# Patient Record
Sex: Female | Born: 1950
Health system: Southern US, Community
[De-identification: ages and names within clinical notes are randomized; demographics above are authoritative.]

## PROBLEM LIST (undated history)

## (undated) DIAGNOSIS — M7541 Impingement syndrome of right shoulder: Secondary | ICD-10-CM

## (undated) DIAGNOSIS — N952 Postmenopausal atrophic vaginitis: Secondary | ICD-10-CM

## (undated) DIAGNOSIS — K6389 Other specified diseases of intestine: Secondary | ICD-10-CM

## (undated) DIAGNOSIS — F32A Depression, unspecified: Secondary | ICD-10-CM

## (undated) DIAGNOSIS — Z8619 Personal history of other infectious and parasitic diseases: Secondary | ICD-10-CM

## (undated) DIAGNOSIS — M1990 Unspecified osteoarthritis, unspecified site: Secondary | ICD-10-CM

## (undated) DIAGNOSIS — C4490 Unspecified malignant neoplasm of skin, unspecified: Secondary | ICD-10-CM

## (undated) DIAGNOSIS — F329 Major depressive disorder, single episode, unspecified: Secondary | ICD-10-CM

## (undated) DIAGNOSIS — E782 Mixed hyperlipidemia: Secondary | ICD-10-CM

## (undated) HISTORY — DX: Unspecified malignant neoplasm of skin, unspecified: C44.90

## (undated) HISTORY — DX: Depression, unspecified: F32.A

## (undated) HISTORY — PX: CARPAL TUNNEL RELEASE: SHX101

## (undated) HISTORY — DX: Personal history of other infectious and parasitic diseases: Z86.19

## (undated) HISTORY — DX: Postmenopausal atrophic vaginitis: N95.2

## (undated) HISTORY — DX: Major depressive disorder, single episode, unspecified: F32.9

## (undated) HISTORY — PX: TONSILLECTOMY: SUR1361

## (undated) HISTORY — DX: Other specified diseases of intestine: K63.89

---
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
## 1990-08-25 HISTORY — PX: ABDOMINAL HYSTERECTOMY: SHX81

## 2000-12-03 ENCOUNTER — Other Ambulatory Visit: Admission: RE | Admit: 2000-12-03 | Discharge: 2000-12-03 | Payer: Self-pay | Admitting: Internal Medicine

## 2001-11-10 ENCOUNTER — Encounter: Payer: Self-pay | Admitting: Internal Medicine

## 2001-11-10 ENCOUNTER — Ambulatory Visit (HOSPITAL_COMMUNITY): Admission: RE | Admit: 2001-11-10 | Discharge: 2001-11-10 | Payer: Self-pay | Admitting: Internal Medicine

## 2002-11-01 ENCOUNTER — Encounter: Payer: Self-pay | Admitting: Gastroenterology

## 2002-11-02 ENCOUNTER — Encounter: Payer: Self-pay | Admitting: Gastroenterology

## 2002-11-15 ENCOUNTER — Encounter: Payer: Self-pay | Admitting: Gastroenterology

## 2003-09-13 ENCOUNTER — Ambulatory Visit (HOSPITAL_COMMUNITY): Admission: RE | Admit: 2003-09-13 | Discharge: 2003-09-13 | Payer: Self-pay | Admitting: Internal Medicine

## 2004-09-19 ENCOUNTER — Ambulatory Visit (HOSPITAL_COMMUNITY): Admission: RE | Admit: 2004-09-19 | Discharge: 2004-09-19 | Payer: Self-pay | Admitting: Internal Medicine

## 2004-10-28 ENCOUNTER — Ambulatory Visit: Payer: Self-pay | Admitting: Internal Medicine

## 2005-05-22 ENCOUNTER — Ambulatory Visit: Payer: Self-pay | Admitting: Internal Medicine

## 2005-06-04 ENCOUNTER — Ambulatory Visit: Payer: Self-pay | Admitting: Internal Medicine

## 2005-06-11 ENCOUNTER — Ambulatory Visit: Payer: Self-pay | Admitting: Internal Medicine

## 2005-07-22 ENCOUNTER — Ambulatory Visit: Payer: Self-pay | Admitting: Internal Medicine

## 2005-10-17 ENCOUNTER — Ambulatory Visit: Payer: Self-pay | Admitting: Internal Medicine

## 2005-10-20 ENCOUNTER — Ambulatory Visit (HOSPITAL_COMMUNITY): Admission: RE | Admit: 2005-10-20 | Discharge: 2005-10-20 | Payer: Self-pay | Admitting: Internal Medicine

## 2006-06-22 ENCOUNTER — Ambulatory Visit: Payer: Self-pay | Admitting: Internal Medicine

## 2006-06-30 ENCOUNTER — Ambulatory Visit: Payer: Self-pay | Admitting: Internal Medicine

## 2006-06-30 LAB — CONVERTED CEMR LAB
Cholesterol: 222 mg/dL (ref 0–200)
HDL: 47.4 mg/dL (ref >39.0)
Triglyceride fasting, serum: 73 mg/dL (ref 0–149)
VLDL: 15 mg/dL (ref 0–40)

## 2006-12-03 ENCOUNTER — Ambulatory Visit (HOSPITAL_COMMUNITY): Admission: RE | Admit: 2006-12-03 | Discharge: 2006-12-03 | Payer: Self-pay | Admitting: Internal Medicine

## 2006-12-22 ENCOUNTER — Ambulatory Visit: Payer: Self-pay | Admitting: Internal Medicine

## 2006-12-31 ENCOUNTER — Ambulatory Visit: Payer: Self-pay | Admitting: Internal Medicine

## 2006-12-31 ENCOUNTER — Encounter: Payer: Self-pay | Admitting: Internal Medicine

## 2007-04-09 DIAGNOSIS — F3289 Other specified depressive episodes: Secondary | ICD-10-CM | POA: Insufficient documentation

## 2007-04-09 DIAGNOSIS — F329 Major depressive disorder, single episode, unspecified: Secondary | ICD-10-CM

## 2007-05-07 ENCOUNTER — Ambulatory Visit: Payer: Self-pay | Admitting: Internal Medicine

## 2007-06-29 ENCOUNTER — Ambulatory Visit: Payer: Self-pay | Admitting: Internal Medicine

## 2007-06-30 LAB — CONVERTED CEMR LAB
AST: 23 units/L (ref 0–37)
Bilirubin, Direct: 0.1 mg/dL (ref 0.0–0.3)
Chloride: 105 meq/L (ref 96–112)
Creatinine, Ser: 0.8 mg/dL (ref 0.4–1.2)
Direct LDL: 170.9 mg/dL
Eosinophils Relative: 1.2 % (ref 0.0–5.0)
Glucose, Bld: 70 mg/dL (ref 70–99)
HCT: 37.7 % (ref 36.0–46.0)
Hemoglobin: 12.8 g/dL (ref 12.0–15.0)
MCV: 84.4 fL (ref 78.0–100.0)
Neutrophils Relative %: 51.2 % (ref 43.0–77.0)
RBC: 4.47 M/uL (ref 3.87–5.11)
RDW: 12.8 % (ref 11.5–14.6)
Sodium: 141 meq/L (ref 135–145)
Total Bilirubin: 0.8 mg/dL (ref 0.3–1.2)
Total CHOL/HDL Ratio: 4.9
Total Protein: 6.4 g/dL (ref 6.0–8.3)
Triglycerides: 97 mg/dL (ref 0–149)
WBC: 5.3 10*3/uL (ref 4.5–10.5)

## 2007-07-06 ENCOUNTER — Ambulatory Visit: Payer: Self-pay | Admitting: Internal Medicine

## 2007-07-06 DIAGNOSIS — N952 Postmenopausal atrophic vaginitis: Secondary | ICD-10-CM | POA: Insufficient documentation

## 2007-07-06 DIAGNOSIS — E782 Mixed hyperlipidemia: Secondary | ICD-10-CM | POA: Insufficient documentation

## 2008-01-12 ENCOUNTER — Ambulatory Visit (HOSPITAL_COMMUNITY): Admission: RE | Admit: 2008-01-12 | Discharge: 2008-01-12 | Payer: Self-pay | Admitting: Internal Medicine

## 2008-02-14 ENCOUNTER — Ambulatory Visit: Payer: Self-pay | Admitting: Internal Medicine

## 2008-02-14 LAB — CONVERTED CEMR LAB
Direct LDL: 154.1 mg/dL
Vit D, 1,25-Dihydroxy: 30 (ref 30–89)

## 2008-02-18 ENCOUNTER — Ambulatory Visit: Payer: Self-pay | Admitting: Internal Medicine

## 2008-02-18 LAB — CONVERTED CEMR LAB
HDL goal, serum: 40 mg/dL
LDL Goal: 130 mg/dL

## 2008-06-07 ENCOUNTER — Ambulatory Visit: Payer: Self-pay | Admitting: Internal Medicine

## 2008-06-13 ENCOUNTER — Ambulatory Visit: Payer: Self-pay | Admitting: Internal Medicine

## 2008-06-16 ENCOUNTER — Encounter: Payer: Self-pay | Admitting: Internal Medicine

## 2008-06-20 ENCOUNTER — Telehealth: Payer: Self-pay | Admitting: *Deleted

## 2008-06-21 ENCOUNTER — Telehealth: Payer: Self-pay | Admitting: Gastroenterology

## 2008-07-31 ENCOUNTER — Telehealth: Payer: Self-pay | Admitting: *Deleted

## 2008-11-05 IMAGING — MG MM DIGITAL SCREENING BILAT W/ CAD
4 series · 4 of 4 positions shown · non-contrast
Comparison: Prior studies.

DG SCREEN MAMMOGRAM BILATERAL
Bilateral CC and MLO view(s) were taken.
Technologist: Neetish Hiriart, RT, RM

DIGITAL SCREENING MAMMOGRAM WITH CAD:

[R CC]
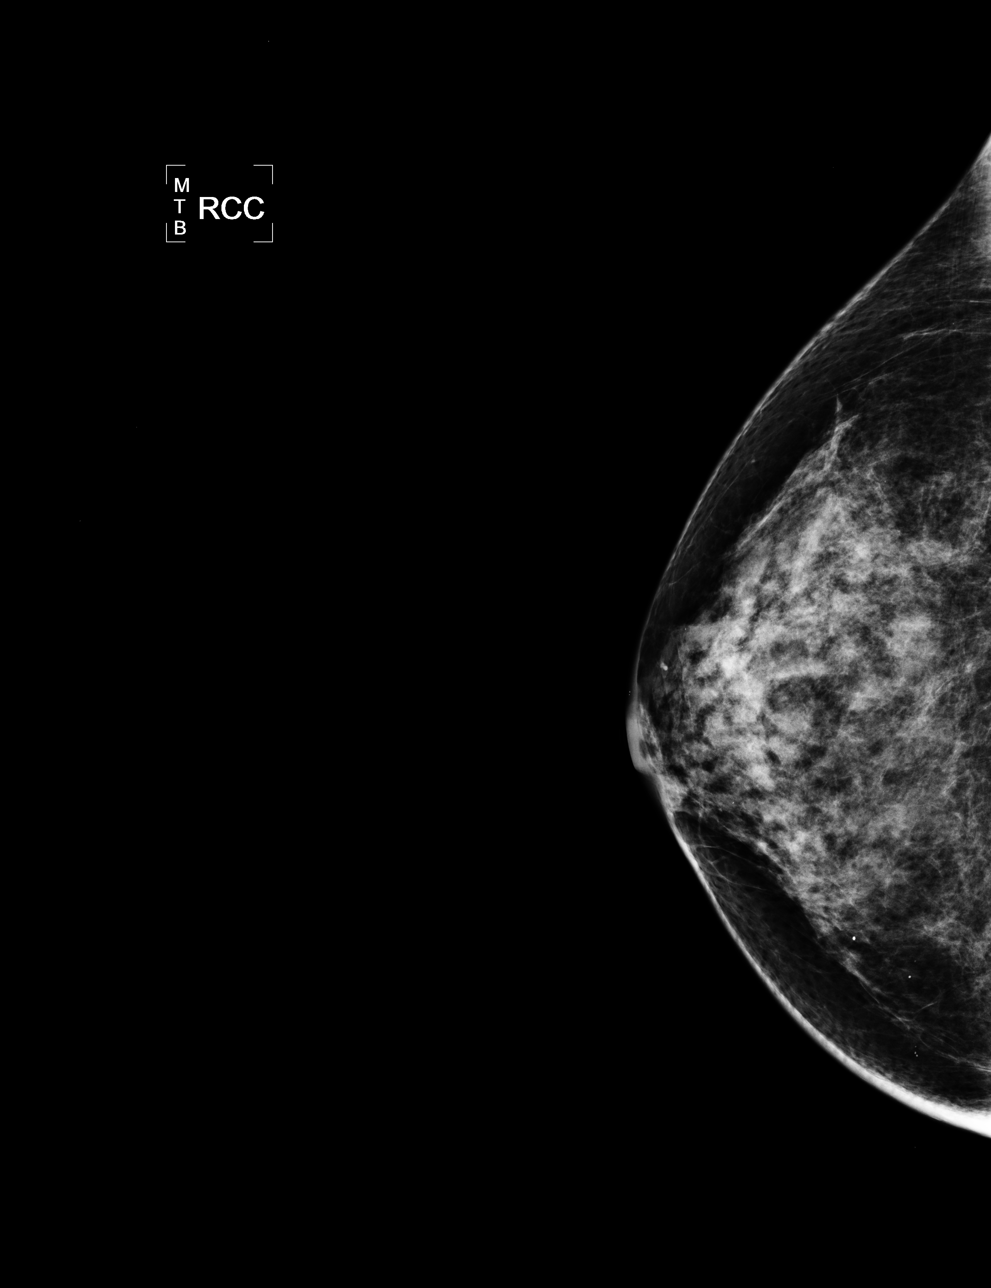

[R MLO]
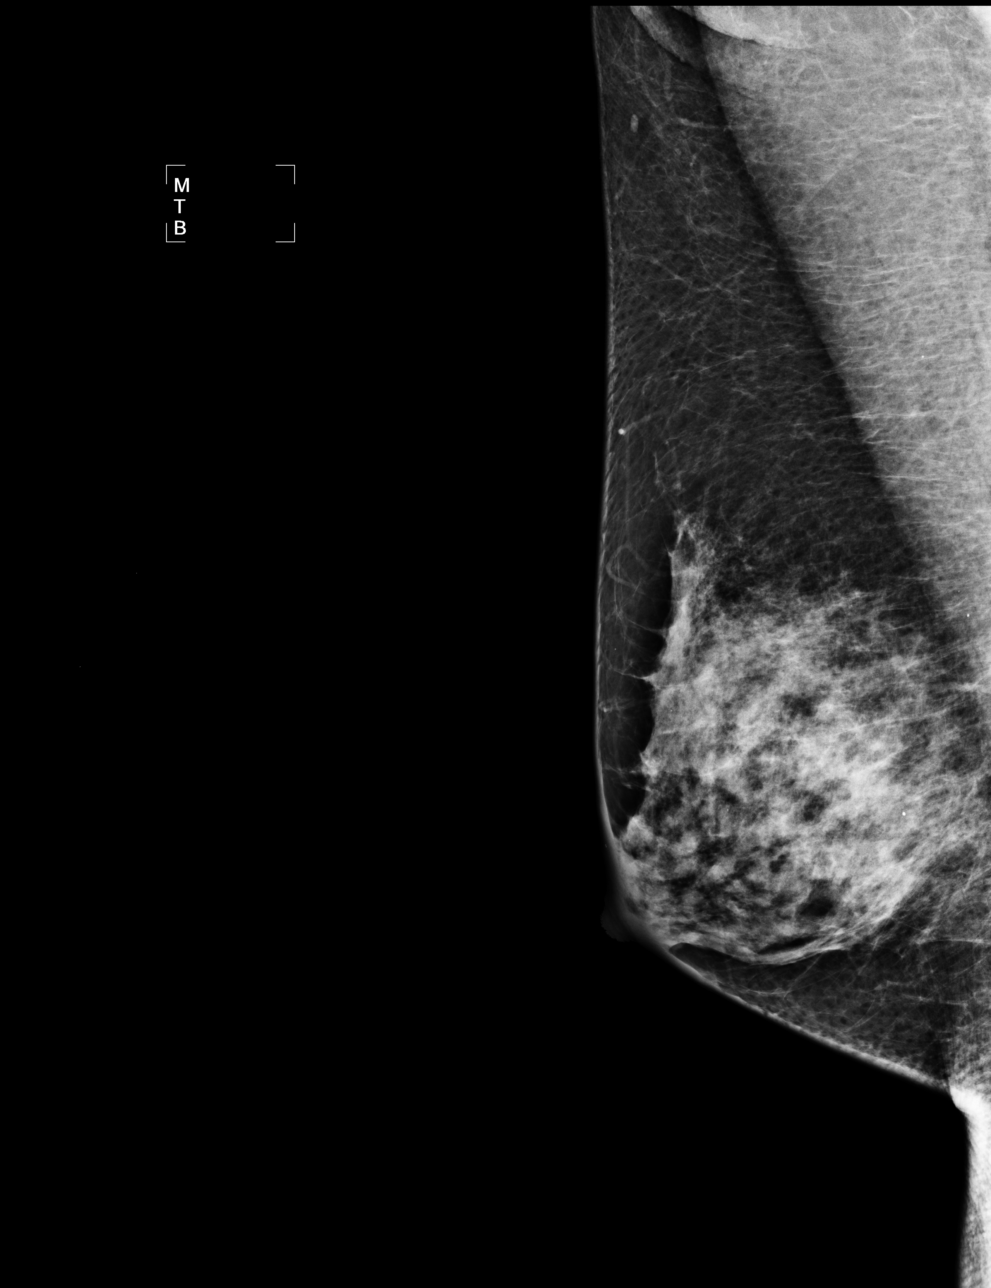

[L CC]
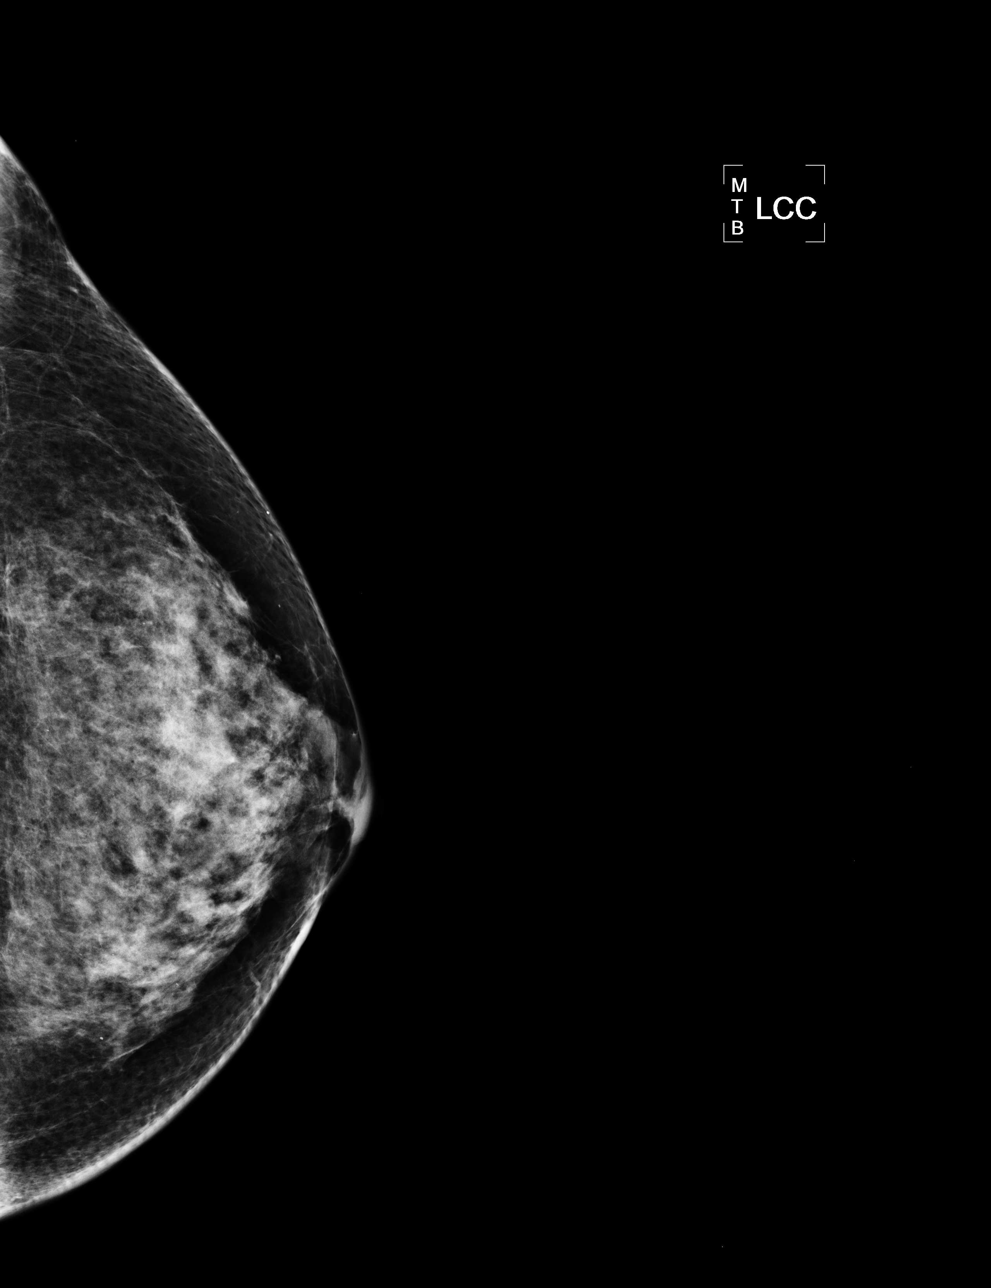

[L MLO]
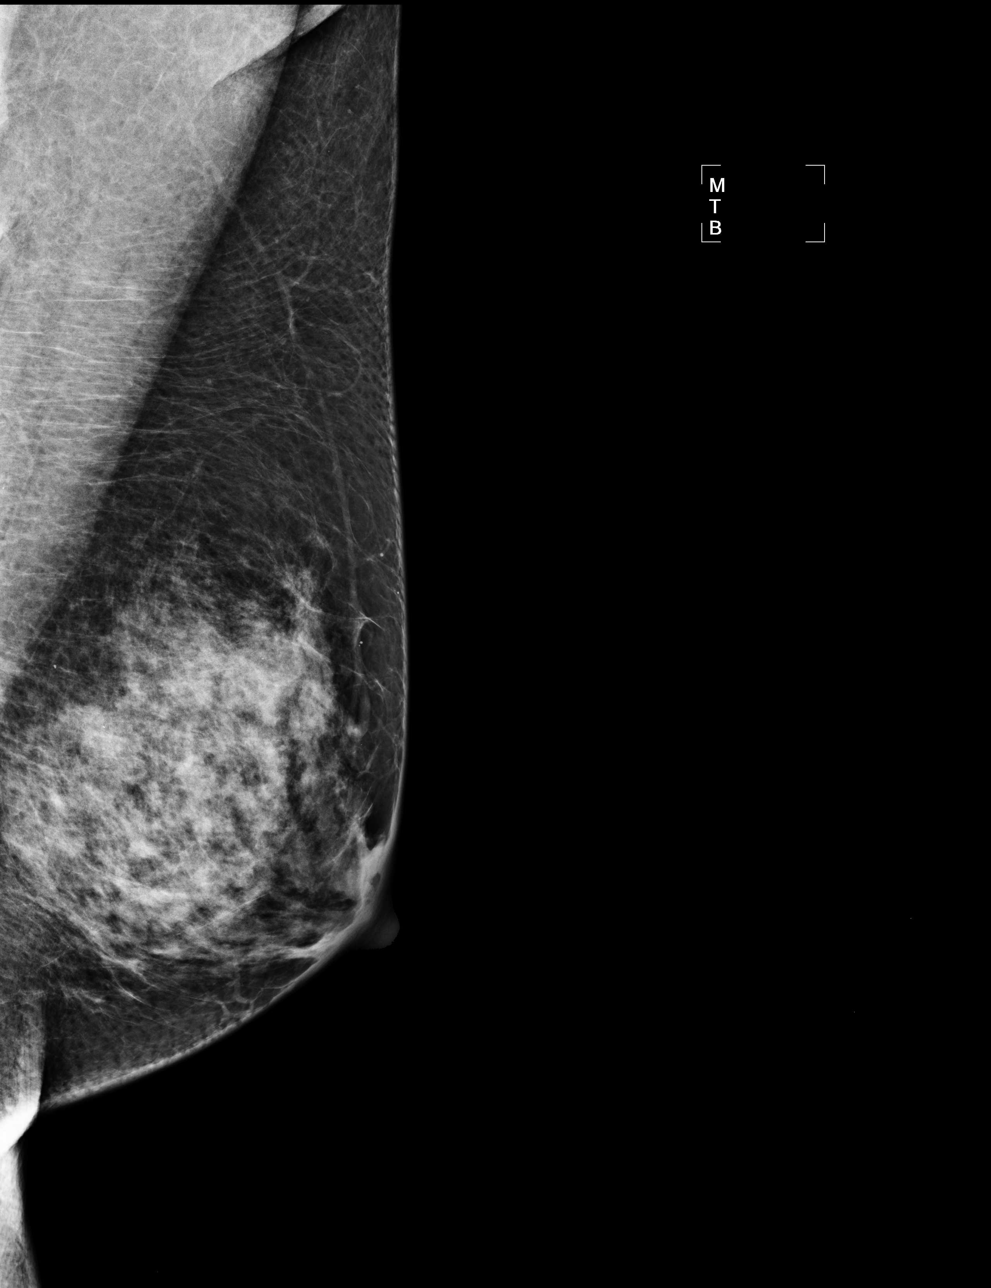

[4 of 4 positions shown; findings below may reference images not displayed]

The breast tissue is heterogeneously dense.  There is no dominant mass, architectural distortion or
calcification to suggest malignancy.
IMPRESSION: No mammographic evidence of malignancy.  Suggest yearly screening mammography.

ASSESSMENT: Negative - BI-RADS 1

Screening mammogram in 1 year.
ANALYZED BY COMPUTER AIDED DETECTION. , THIS PROCEDURE WAS A DIGITAL MAMMOGRAM.

## 2008-11-06 ENCOUNTER — Ambulatory Visit: Payer: Self-pay | Admitting: Internal Medicine

## 2008-11-06 LAB — CONVERTED CEMR LAB
Blood in Urine, dipstick: NEGATIVE
Nitrite: NEGATIVE
Specific Gravity, Urine: 1.01
Tissue Transglutaminase Ab, IgA: 0 units (ref <7)
WBC Urine, dipstick: NEGATIVE

## 2008-11-08 ENCOUNTER — Encounter: Payer: Self-pay | Admitting: Internal Medicine

## 2008-11-08 LAB — CONVERTED CEMR LAB
BUN: 14 mg/dL (ref 6–23)
Basophils Absolute: 0 10*3/uL (ref 0.0–0.1)
Bilirubin, Direct: 0 mg/dL (ref 0.0–0.3)
Chloride: 103 meq/L (ref 96–112)
Creatinine, Ser: 0.8 mg/dL (ref 0.4–1.2)
Eosinophils Absolute: 0.1 10*3/uL (ref 0.0–0.7)
Eosinophils Relative: 1 % (ref 0.0–5.0)
Free T4: 0.8 ng/dL (ref 0.6–1.6)
IgA: 87 mg/dL (ref 68–378)
MCV: 85.1 fL (ref 78.0–100.0)
Monocytes Absolute: 0.3 10*3/uL (ref 0.1–1.0)
Neutrophils Relative %: 57.7 % (ref 43.0–77.0)
Platelets: 216 10*3/uL (ref 150.0–400.0)
RDW: 12.6 % (ref 11.5–14.6)
T3, Free: 2.5 pg/mL (ref 2.3–4.2)
TSH: 1.28 microintl units/mL (ref 0.35–5.50)
Total Bilirubin: 0.7 mg/dL (ref 0.3–1.2)
WBC: 6.1 10*3/uL (ref 4.5–10.5)

## 2008-12-05 ENCOUNTER — Ambulatory Visit: Payer: Self-pay | Admitting: Gastroenterology

## 2008-12-06 ENCOUNTER — Ambulatory Visit: Payer: Self-pay | Admitting: Gastroenterology

## 2008-12-06 ENCOUNTER — Encounter: Payer: Self-pay | Admitting: Gastroenterology

## 2008-12-11 ENCOUNTER — Encounter: Payer: Self-pay | Admitting: Gastroenterology

## 2008-12-14 ENCOUNTER — Telehealth: Payer: Self-pay | Admitting: Gastroenterology

## 2009-01-01 ENCOUNTER — Telehealth: Payer: Self-pay | Admitting: *Deleted

## 2009-01-31 ENCOUNTER — Telehealth: Payer: Self-pay | Admitting: *Deleted

## 2009-02-07 ENCOUNTER — Ambulatory Visit (HOSPITAL_COMMUNITY): Admission: RE | Admit: 2009-02-07 | Discharge: 2009-02-07 | Payer: Self-pay | Admitting: Internal Medicine

## 2009-03-12 ENCOUNTER — Ambulatory Visit: Payer: Self-pay | Admitting: Internal Medicine

## 2009-03-12 LAB — CONVERTED CEMR LAB
AST: 22 units/L (ref 0–37)
Albumin: 4.2 g/dL (ref 3.5–5.2)
Alkaline Phosphatase: 52 units/L (ref 39–117)
Basophils Absolute: 0 10*3/uL (ref 0.0–0.1)
Basophils Relative: 0.7 % (ref 0.0–3.0)
CO2: 31 meq/L (ref 19–32)
GFR calc non Af Amer: 90.77 mL/min (ref >60)
Glucose, Bld: 81 mg/dL (ref 70–99)
HCT: 39.1 % (ref 36.0–46.0)
Hemoglobin: 13.3 g/dL (ref 12.0–15.0)
Lymphs Abs: 2.1 10*3/uL (ref 0.7–4.0)
MCHC: 33.9 g/dL (ref 30.0–36.0)
Monocytes Relative: 8.5 % (ref 3.0–12.0)
Neutro Abs: 2 10*3/uL (ref 1.4–7.7)
Nitrite: NEGATIVE
Potassium: 4.1 meq/L (ref 3.5–5.1)
RBC: 4.59 M/uL (ref 3.87–5.11)
RDW: 12.9 % (ref 11.5–14.6)
Sodium: 143 meq/L (ref 135–145)
Specific Gravity, Urine: 1.015
TSH: 2.44 microintl units/mL (ref 0.35–5.50)
Total CHOL/HDL Ratio: 5
Total Protein: 6.5 g/dL (ref 6.0–8.3)
Urobilinogen, UA: 0.2
WBC Urine, dipstick: NEGATIVE

## 2009-03-19 ENCOUNTER — Ambulatory Visit: Payer: Self-pay | Admitting: Internal Medicine

## 2009-03-22 ENCOUNTER — Telehealth: Payer: Self-pay | Admitting: *Deleted

## 2009-05-10 ENCOUNTER — Ambulatory Visit: Payer: Self-pay | Admitting: Internal Medicine

## 2009-05-10 LAB — CONVERTED CEMR LAB
AST: 22 units/L (ref 0–37)
Alkaline Phosphatase: 45 units/L (ref 39–117)
Bilirubin, Direct: 0.1 mg/dL (ref 0.0–0.3)
LDL Cholesterol: 93 mg/dL (ref 0–99)
Total Bilirubin: 0.8 mg/dL (ref 0.3–1.2)
Total CHOL/HDL Ratio: 3
Triglycerides: 80 mg/dL (ref 0.0–149.0)

## 2009-05-16 ENCOUNTER — Ambulatory Visit: Payer: Self-pay | Admitting: Internal Medicine

## 2009-06-04 ENCOUNTER — Telehealth: Payer: Self-pay | Admitting: *Deleted

## 2009-11-08 ENCOUNTER — Telehealth: Payer: Self-pay | Admitting: *Deleted

## 2009-12-10 ENCOUNTER — Ambulatory Visit: Payer: Self-pay | Admitting: Internal Medicine

## 2009-12-10 DIAGNOSIS — K589 Irritable bowel syndrome without diarrhea: Secondary | ICD-10-CM | POA: Insufficient documentation

## 2009-12-10 DIAGNOSIS — T50995A Adverse effect of other drugs, medicaments and biological substances, initial encounter: Secondary | ICD-10-CM | POA: Insufficient documentation

## 2010-01-07 ENCOUNTER — Telehealth: Payer: Self-pay | Admitting: *Deleted

## 2010-01-09 ENCOUNTER — Ambulatory Visit: Payer: Self-pay | Admitting: Internal Medicine

## 2010-02-13 ENCOUNTER — Ambulatory Visit (HOSPITAL_COMMUNITY): Admission: RE | Admit: 2010-02-13 | Discharge: 2010-02-13 | Payer: Self-pay | Admitting: Internal Medicine

## 2010-03-13 ENCOUNTER — Ambulatory Visit: Payer: Self-pay | Admitting: Internal Medicine

## 2010-03-13 DIAGNOSIS — M25569 Pain in unspecified knee: Secondary | ICD-10-CM | POA: Insufficient documentation

## 2010-03-13 LAB — CONVERTED CEMR LAB
ALT: 16 units/L (ref 0–35)
AST: 21 units/L (ref 0–37)
Alkaline Phosphatase: 48 units/L (ref 39–117)
Basophils Absolute: 0 10*3/uL (ref 0.0–0.1)
Blood in Urine, dipstick: NEGATIVE
Calcium: 8.6 mg/dL (ref 8.4–10.5)
Eosinophils Relative: 1.6 % (ref 0.0–5.0)
GFR calc non Af Amer: 74.32 mL/min (ref >60)
Glucose, Bld: 87 mg/dL (ref 70–99)
HCT: 35.3 % — ABNORMAL LOW (ref 36.0–46.0)
HDL: 55.5 mg/dL (ref >39.00)
Hemoglobin: 12.1 g/dL (ref 12.0–15.0)
Ketones, urine, test strip: NEGATIVE
Lymphocytes Relative: 36.1 % (ref 12.0–46.0)
Monocytes Relative: 8.5 % (ref 3.0–12.0)
Neutro Abs: 2.3 10*3/uL (ref 1.4–7.7)
Nitrite: NEGATIVE
Platelets: 227 10*3/uL (ref 150.0–400.0)
Potassium: 4.7 meq/L (ref 3.5–5.1)
RDW: 13.9 % (ref 11.5–14.6)
Sed Rate: 5 mm/hr (ref 0–22)
Sodium: 142 meq/L (ref 135–145)
Specific Gravity, Urine: 1.02
Total Bilirubin: 0.4 mg/dL (ref 0.3–1.2)
Triglycerides: 112 mg/dL (ref 0.0–149.0)
Urobilinogen, UA: 0.2
VLDL: 22.4 mg/dL (ref 0.0–40.0)
WBC Urine, dipstick: NEGATIVE
WBC: 4.4 10*3/uL — ABNORMAL LOW (ref 4.5–10.5)

## 2010-03-20 ENCOUNTER — Ambulatory Visit: Payer: Self-pay | Admitting: Internal Medicine

## 2010-03-20 DIAGNOSIS — D72819 Decreased white blood cell count, unspecified: Secondary | ICD-10-CM | POA: Insufficient documentation

## 2010-04-11 ENCOUNTER — Ambulatory Visit: Payer: Self-pay | Admitting: Internal Medicine

## 2010-04-22 ENCOUNTER — Ambulatory Visit: Payer: Self-pay | Admitting: Internal Medicine

## 2010-05-08 ENCOUNTER — Telehealth: Payer: Self-pay | Admitting: *Deleted

## 2010-05-20 ENCOUNTER — Ambulatory Visit: Payer: Self-pay | Admitting: Internal Medicine

## 2010-05-29 ENCOUNTER — Ambulatory Visit: Payer: Self-pay | Admitting: Psychology

## 2010-06-11 ENCOUNTER — Ambulatory Visit: Payer: Self-pay | Admitting: Psychology

## 2010-06-12 ENCOUNTER — Telehealth: Payer: Self-pay | Admitting: Internal Medicine

## 2010-06-14 ENCOUNTER — Encounter: Payer: Self-pay | Admitting: Internal Medicine

## 2010-06-17 ENCOUNTER — Ambulatory Visit: Payer: Self-pay | Admitting: Internal Medicine

## 2010-06-17 LAB — CONVERTED CEMR LAB: CRP, High Sensitivity: 0.33 (ref 0.00–5.00)

## 2010-06-19 ENCOUNTER — Telehealth (INDEPENDENT_AMBULATORY_CARE_PROVIDER_SITE_OTHER): Payer: Self-pay | Admitting: *Deleted

## 2010-06-21 ENCOUNTER — Encounter: Payer: Self-pay | Admitting: *Deleted

## 2010-06-21 LAB — CONVERTED CEMR LAB
Anti Nuclear Antibody(ANA): NEGATIVE
C3 Complement: 78 mg/dL — ABNORMAL LOW (ref 88–201)
Complement C4, Body Fluid: 20 mg/dL (ref 16–47)
Hemoglobin: 11.9 g/dL — ABNORMAL LOW (ref 12.0–15.0)
Lymphocytes Relative: 32 % (ref 12–46)
Lymphs Abs: 2.4 10*3/uL (ref 0.7–4.0)
Monocytes Relative: 6 % (ref 3–12)
Neutro Abs: 4.6 10*3/uL (ref 1.7–7.7)
Neutrophils Relative %: 61 % (ref 43–77)
RBC: 4.33 M/uL (ref 3.87–5.11)
Rhuematoid fact SerPl-aCnc: <20 intl units/mL (ref 0–20)
WBC: 7.6 10*3/uL (ref 4.0–10.5)
ds DNA Ab: <1 (ref <30)

## 2010-07-10 ENCOUNTER — Ambulatory Visit: Payer: Self-pay | Admitting: Internal Medicine

## 2010-09-13 ENCOUNTER — Other Ambulatory Visit: Payer: Self-pay | Admitting: Internal Medicine

## 2010-09-13 ENCOUNTER — Ambulatory Visit
Admission: RE | Admit: 2010-09-13 | Discharge: 2010-09-13 | Payer: Self-pay | Source: Home / Self Care | Attending: Internal Medicine | Admitting: Internal Medicine

## 2010-09-13 LAB — CBC WITH DIFFERENTIAL/PLATELET
Basophils Absolute: 0 10*3/uL (ref 0.0–0.1)
Basophils Relative: 0.6 % (ref 0.0–3.0)
Eosinophils Absolute: 0.1 10*3/uL (ref 0.0–0.7)
Eosinophils Relative: 1.7 % (ref 0.0–5.0)
HCT: 39 % (ref 36.0–46.0)
Hemoglobin: 13.4 g/dL (ref 12.0–15.0)
Lymphocytes Relative: 39.2 % (ref 12.0–46.0)
Lymphs Abs: 1.8 10*3/uL (ref 0.7–4.0)
MCHC: 34.3 g/dL (ref 30.0–36.0)
MCV: 84.2 fl (ref 78.0–100.0)
Monocytes Absolute: 0.4 10*3/uL (ref 0.1–1.0)
Monocytes Relative: 8.9 % (ref 3.0–12.0)
Neutro Abs: 2.3 10*3/uL (ref 1.4–7.7)
Neutrophils Relative %: 49.6 % (ref 43.0–77.0)
Platelets: 249 10*3/uL (ref 150.0–400.0)
RBC: 4.63 Mil/uL (ref 3.87–5.11)
RDW: 14.1 % (ref 11.5–14.6)
WBC: 4.6 10*3/uL (ref 4.5–10.5)

## 2010-09-20 ENCOUNTER — Ambulatory Visit
Admission: RE | Admit: 2010-09-20 | Discharge: 2010-09-20 | Payer: Self-pay | Source: Home / Self Care | Attending: Internal Medicine | Admitting: Internal Medicine

## 2010-09-24 NOTE — Progress Notes (Signed)
  Faxed results to Dr. Durene Romans. Fax # [phone].

## 2010-09-24 NOTE — Assessment & Plan Note (Signed)
Summary: congestion//ccm   Vital Signs:  Patient profile:   60 year old female Menstrual status:  hysterectomy Weight:      154 pounds O2 Sat:      97 % on Room air Temp:     99.1 degrees F oral Pulse rate:   78 / minute BP sitting:   110 / 60  (left arm) Cuff size:   regular  Vitals Entered By: Romualdo Bolk, CMA (AAMA) (July 10, 2010 9:24 AM)  O2 Flow:  Room air CC: Fever 100.5, coughing, congestion, ha, sore throat, runny nose. This started 11/15.   History of Present Illness: Pamela [name] comesin today for acute illness  sda . worked in yard lat week and though t ahd allergies  Now has fever  yesterday.   100.5  and 99 today .    took  advil type meds.  no new pain but has HA and ichy ears and sorethroat and   cough.   had right molar pain yesterday but better today .   No NVD . No cp sob. so sig cough some HA    Preventive Screening-Counseling & Management  Alcohol-Tobacco     Alcohol drinks/day: <1     Alcohol type: wine     >5/day in last 3 mos: socially     Smoking Status: never     Passive Smoke Exposure: no  Caffeine-Diet-Exercise     Caffeine use/day: 3     Does Patient Exercise: yes     Type of exercise: cardio, strength and yoga     Exercise (avg: min/session): 30-60     Times/week: 5  Current Medications (verified): 1)  Cymbalta 60 Mg  Cpep (Duloxetine Hcl) .Marland Kitchen.. 1 By Mouth Once Daily 2)  Premarin 0.625 Mg/gm Crea (Estrogens, Conjugated) .... Use Twice A Week As Directed 3)  Vitamin D3 1000 Unit Tabs (Cholecalciferol) .... Take 1 Tablet By Mouth Once A Day 4)  B Complex  Tabs (B Complex Vitamins) 5)  Meloxicam 7.5 Mg Tabs (Meloxicam) .Marland Kitchen.. 1 By Mouth Two Times A Day  Allergies (verified): 1)  ! Morphine 2)  Simvastatin  Past History:  Past medical, surgical, family and social histories (including risk factors) reviewed, and no changes noted (except as noted below).  Past Medical History: Reviewed history from 03/13/2010 and no changes  required. High Cholesterol Depression ?Hypertension hx of c difficile recurrent vaginitis atrophic  G0P0 Dexa  2005  and 2008 normal IBS Bursistis hip  Past Surgical History: Reviewed history from 02/18/2008 and no changes required. NCST Hysterectomy total benign ovary tumors  Tonsillectomy Carpal tunnel release right  Past History:  Care Management: Gastroenterology: [facility] Jarold Motto Orthopedics: Charlann Boxer Dermatology: Danella Deis  Family History: Reviewed history from 03/19/2009 and no changes required. Family History of Arthritis Family History High cholesterol Family History Hypertension Family History of Sudden Death Family History of Cardiovascular disorder Family History Diabetes 1st degree relative No FH of Colon Cancer: CVA mom recently  in her 7ss  Father died Multiple Myeloma   Social History: Reviewed history from 05/20/2010 and no changes required. Retired  Engineer, civil (consulting)  works as Engineer, technical sales as needed [employer]  not going to work this school year Married Never Smoked Alcohol use-yes Drug use-no Regular exercise-yes but less with knee pain Daily Caffeine Use 3 cups daily father died last year  HH of 2   Review of Systems       The patient complains of anorexia, fever, and headaches.  The patient denies chest pain, syncope, prolonged cough, abdominal pain, melena, abnormal bleeding, and enlarged lymph nodes.         see hpi  Physical Exam  General:  mildly ill in nad  Head:  Normocephalic and atraumatic without obvious abnormalities. No apparent alopecia or balding. Eyes:  clear  Ears:  R ear normal and L ear normal.   Nose:  crusted mucous bilaterally  face nontender by palpation Mouth:  Oral mucosa and oropharynx without lesions or exudates.  Teeth in good repair. Neck:  No deformities, masses, or tenderness noted. Lungs:  Normal respiratory effort, chest expands symmetrically. Lungs are clear to auscultation, no crackles or  wheezes. Heart:  Normal rate and regular rhythm. S1 and S2 normal without gallop, murmur, click, rub or other extra sounds. Abdomen:  Bowel sounds positive,abdomen soft and non-tender without masses, organomegaly or  noted. Msk:  no joint warmth.   Neurologic:  non focal  Skin:  turgor normal, color normal, no petechiae, and no purpura.   Cervical Nodes:  No lymphadenopathy noted Psych:  Oriented X3, good eye contact, and not anxious appearing.     Impression & Recommendations:  Problem # 1:  FEVER (ICD-780.60) appears viral    rti   Problem # 2:  URI (ICD-465.9) poss early sinusitis  would  be cautious with antibiotic   as hx of c diff in past .    use local decongestants and measures and call with alarm features.  Her updated medication list for this problem includes:    Meloxicam 7.5 Mg Tabs (Meloxicam) .Marland Kitchen... 1 by mouth two times a day probably underlying allergy    ok to take  claritin     Complete Medication List: 1)  Cymbalta 60 Mg Cpep (Duloxetine hcl) .Marland Kitchen.. 1 by mouth once daily 2)  Premarin 0.625 Mg/gm Crea (Estrogens, conjugated) .... Use twice a week as directed 3)  Vitamin D3 1000 Unit Tabs (Cholecalciferol) .... Take 1 tablet by mouth once a day 4)  B Complex Tabs (B complex vitamins) 5)  Meloxicam 7.5 Mg Tabs (Meloxicam) .Marland Kitchen.. 1 by mouth two times a day  Patient Instructions: 1)  i think this is a viral infection but agree you should take the allergy medication 2)  also add saline nasal washes  and  afrin nose spray for  sinus congestion for up to 3 days    to reduce sinus pressure. 3)  call if  fever lasting more than 3 days total 4)  of severe persistent pain over  sinus or teerh area.  5)  Acute sinusitis symptoms for less than 10 days are not helped by antibiotics. Use warm moist compresses, and over the counter decongestants( only as directed). Call if no improvement in 5-7 days, sooner if increasing pain, fever, or new symptoms.    Orders Added: 1)  Est.  Patient Level III [25956]

## 2010-09-24 NOTE — Progress Notes (Signed)
Summary: update on cymbalta  Phone Note Call from Patient Call back at Home Phone [phone]   Caller: vm Summary of Call: Want to continue Cymbalta 60mg .  Think things are going better.   Initial call taken by: Rudy Jew, RN,  June 12, 2010 4:33 PM  Follow-up for Phone Call        ok to continue  60 mg cymbalta and return office visit in 3-4 months  Follow-up by: Madelin Headings MD,  June 12, 2010 4:59 PM  Additional Follow-up for Phone Call Additional follow up Details #1::        Phone Call Completed Additional Follow-up by: Rudy Jew, RN,  June 12, 2010 5:43 PM

## 2010-09-24 NOTE — Assessment & Plan Note (Signed)
Summary: med check//ccm   Vital Signs:  Patient profile:   60 year old female Menstrual status:  hysterectomy Weight:      155 pounds Pulse rate:   78 / minute BP sitting:   120 / 80  (left arm) Cuff size:   regular  Vitals Entered By: Romualdo Bolk, CMA (AAMA) (April 22, 2010 2:30 PM) CC: Follow-up visit on meds, Hypertension Management   History of Present Illness: Pamela [name]  comesin for med check  as although she felt ok at her preventive visit  her  husband thinks her cybalta not working a well .  MOm is very ill    she helps  caretaking  although mom lives in [place]  He has noted she is acting more in Isolation and being quieter that unusual.    Feels a bit oberwhelmed but  not hopeless ... less energy.  less enjoyment.     Never worked through fathers death 5 years ago.    No other factors noted  no counseling at present .   Knee pain  better with the mobic bu ttrying not to take and ortho eval not that helpful  Hypertension History:      She denies headache, chest pain, palpitations, dyspnea with exertion, orthopnea, PND, peripheral edema, visual symptoms, neurologic problems, syncope, and side effects from treatment.  She notes no problems with any antihypertensive medication side effects.        Positive major cardiovascular risk factors include female age 69 years old or older, hyperlipidemia, and hypertension.  Negative major cardiovascular risk factors include non-tobacco-user status.     Preventive Screening-Counseling & Management  Alcohol-Tobacco     Alcohol drinks/day: <1     Alcohol type: wine     >5/day in last 3 mos: socially     Smoking Status: never     Passive Smoke Exposure: no  Caffeine-Diet-Exercise     Caffeine use/day: 3     Does Patient Exercise: yes     Type of exercise: cardio, strength and yoga     Exercise (avg: min/session): 30-60     Times/week: 5  Current Medications (verified): 1)  Cymbalta 60 Mg  Cpep (Duloxetine Hcl) .Marland Kitchen.. 1 By  Mouth Once Daily 2)  Premarin 0.625 Mg/gm Crea (Estrogens, Conjugated) .... Use Twice A Week As Directed 3)  Vitamin D3 1000 Unit Tabs (Cholecalciferol) .... Take 1 Tablet By Mouth Once A Day 4)  B Complex  Tabs (B Complex Vitamins) 5)  Meloxicam 7.5 Mg Tabs (Meloxicam) .Marland Kitchen.. 1 By Mouth Two Times A Day 6)  Hydrocodone-Acetaminophen 5-325 Mg Tabs (Hydrocodone-Acetaminophen) .Marland Kitchen.. 1-2 By Mouth Q4-6 Hours For Severe Pain  Allergies (verified): 1)  ! Morphine 2)  Simvastatin  Past History:  Past medical, surgical, family and social histories (including risk factors) reviewed, and no changes noted (except as noted below).  Past Medical History: Reviewed history from 03/13/2010 and no changes required. High Cholesterol Depression ?Hypertension hx of c difficile recurrent vaginitis atrophic  G0P0 Dexa  2005  and 2008 normal IBS Bursistis hip  Past Surgical History: Reviewed history from 02/18/2008 and no changes required. NCST Hysterectomy total benign ovary tumors  Tonsillectomy Carpal tunnel release right  Past History:  Care Management: Gastroenterology: [facility] Motto Orthopedics: Charlann Boxer Dermatology: Danella Deis  Family History: Reviewed history from 03/19/2009 and no changes required. Family History of Arthritis Family History High cholesterol Family History Hypertension Family History of Sudden Death Family History of Cardiovascular disorder Family History Diabetes 1st  degree relative No FH of Colon Cancer: CVA mom recently  in her 36ss  Father died Multiple Myeloma   Social History: Reviewed history from 03/20/2010 and no changes required. Retired  Engineer, civil (consulting)  works as Engineer, technical sales as needed [employer]  Married Never Smoked Alcohol use-yes Drug use-no Regular exercise-yes but less with knee pain Daily Caffeine Use 3 cups daily HH of 2   Review of Systems  The patient denies anorexia, fever, weight loss, weight gain, vision loss, transient  blindness, difficulty walking, abnormal bleeding, and enlarged lymph nodes.    Physical Exam  General:  Well-developed,well-nourished,in no acute distress; alert,appropriate and cooperative throughout examination Psych:  Oriented X3, good eye contact, not anxious appearing, not depressed appearing, and subdued. nl cognition and thought and speech     Impression & Recommendations:  Problem # 1:  DEPRESSION (ICD-311) Assessment Deteriorated external trigger moms illness .    No other alarm features  .    counseling  suggested with meds  HO given    increase to 90 mg per day.  samples of 30 given  Her updated medication list for this problem includes:    Cymbalta 60 Mg Cpep (Duloxetine hcl) .Marland Kitchen... 1 by mouth once daily    Cymbalta 30 Mg Cpep (Duloxetine hcl) .Marland Kitchen... 1 by mouth once daily  90 mg total  Problem # 2:  KNEE PAIN, BILATERAL (ICD-719.46) ok to  minimize nsaid and use as needed for now . eval note not in EMR  at present Her updated medication list for this problem includes:    Meloxicam 7.5 Mg Tabs (Meloxicam) .Marland Kitchen... 1 by mouth two times a day    Hydrocodone-acetaminophen 5-325 Mg Tabs (Hydrocodone-acetaminophen) .Marland Kitchen... 1-2 by mouth q4-6 hours for severe pain  Complete Medication List: 1)  Cymbalta 60 Mg Cpep (Duloxetine hcl) .Marland Kitchen.. 1 by mouth once daily 2)  Premarin 0.625 Mg/gm Crea (Estrogens, conjugated) .... Use twice a week as directed 3)  Vitamin D3 1000 Unit Tabs (Cholecalciferol) .... Take 1 tablet by mouth once a day 4)  B Complex Tabs (B complex vitamins) 5)  Meloxicam 7.5 Mg Tabs (Meloxicam) .Marland Kitchen.. 1 by mouth two times a day 6)  Hydrocodone-acetaminophen 5-325 Mg Tabs (Hydrocodone-acetaminophen) .Marland Kitchen.. 1-2 by mouth q4-6 hours for severe pain 7)  Cymbalta 30 Mg Cpep (Duloxetine hcl) .Marland Kitchen.. 1 by mouth once daily  90 mg total  Hypertension Assessment/Plan:      The patient's hypertensive risk group is category B: At least one risk factor (excluding diabetes) with no target organ  damage.  Her calculated 10 year risk of coronary heart disease is 7 %.  Today's blood pressure is 120/80.  Her blood pressure goal is < 140/90.  Patient Instructions: 1)  increase  in      cymbalta 90 mg per day   2)  then rov in about a month Prescriptions: MELOXICAM 7.5 MG TABS (MELOXICAM) 1 by mouth two times a day  #60 x 1   Entered and Authorized by:   Madelin Headings MD   Signed by:   Madelin Headings MD on 04/22/2010   Method used:   Electronically to        [facility] (retail)       [address]       [place], [place]  [address]       Ph: [phone] or [phone]       Fax: [phone]  RxID:   [id number]  greater than 50% of visit spent in counseling  25 minutes  .

## 2010-09-24 NOTE — Assessment & Plan Note (Signed)
Summary: 4 week fup//ccm   Vital Signs:  Patient profile:   60 year old female Menstrual status:  hysterectomy Weight:      155 pounds Pulse rate:   78 / minute BP sitting:   120 / 80  (right arm) Cuff size:   regular  Vitals Entered By: Romualdo Bolk, CMA (AAMA) (May 20, 2010 2:00 PM) CC: Follow-up visit , Hypertension Management, Pt states that she is not sure the cymbalta is working well.   History of Present Illness: Pamela [name] comes in today   for follow up of meds with husband .  She has been on a  higher dose 90 mg cymbalta    had anorexia and foggy and tired   about 2 weeks into it.     now not going back to school  and feels ok about htat as  this was stressful.  actually doing some better.  She  likes  to exercise  more.Husband feels she is trying to hard to exercise even when  knees hurt her. no recent exercise.    No suicidal issues but some irritability  and holds things in.   husband related that he feels meds cause se and  doesnt really need meds now. Patinet says some concern about stopping ans had to restart in the past  . had done ok on fluoxetine in the remote past..   Hypertension History:      She denies headache, chest pain, palpitations, dyspnea with exertion, orthopnea, PND, peripheral edema, visual symptoms, neurologic problems, syncope, and side effects from treatment.  She notes no problems with any antihypertensive medication side effects.        Positive major cardiovascular risk factors include female age 4 years old or older, hyperlipidemia, and hypertension.  Negative major cardiovascular risk factors include non-tobacco-user status.     Preventive Screening-Counseling & Management  Alcohol-Tobacco     Alcohol drinks/day: <1     Alcohol type: wine     >5/day in last 3 mos: socially     Smoking Status: never     Passive Smoke Exposure: no  Caffeine-Diet-Exercise     Caffeine use/day: 3     Does Patient Exercise: yes     Type of  exercise: cardio, strength and yoga     Exercise (avg: min/session): 30-60     Times/week: 5  Current Medications (verified): 1)  Cymbalta 60 Mg  Cpep (Duloxetine Hcl) .Marland Kitchen.. 1 By Mouth Once Daily 2)  Premarin 0.625 Mg/gm Crea (Estrogens, Conjugated) .... Use Twice A Week As Directed 3)  Vitamin D3 1000 Unit Tabs (Cholecalciferol) .... Take 1 Tablet By Mouth Once A Day 4)  B Complex  Tabs (B Complex Vitamins) 5)  Meloxicam 7.5 Mg Tabs (Meloxicam) .Marland Kitchen.. 1 By Mouth Two Times A Day 6)  Hydrocodone-Acetaminophen 5-325 Mg Tabs (Hydrocodone-Acetaminophen) .Marland Kitchen.. 1-2 By Mouth Q4-6 Hours For Severe Pain 7)  Cymbalta 30 Mg Cpep (Duloxetine Hcl) .Marland Kitchen.. 1 By Mouth Once Daily  90 Mg Total  Allergies (verified): 1)  ! Morphine 2)  Simvastatin  Past History:  Past medical, surgical, family and social histories (including risk factors) reviewed, and no changes noted (except as noted below).  Past Medical History: Reviewed history from 03/13/2010 and no changes required. High Cholesterol Depression ?Hypertension hx of c difficile recurrent vaginitis atrophic  G0P0 Dexa  2005  and 2008 normal IBS Bursistis hip  Past Surgical History: Reviewed history from 02/18/2008 and no changes required. NCST Hysterectomy total benign ovary  tumors  Tonsillectomy Carpal tunnel release right  Past History:  Care Management: Gastroenterology: [facility] Jarold Motto Orthopedics: Charlann Boxer Dermatology: Danella Deis  Family History: Reviewed history from 03/19/2009 and no changes required. Family History of Arthritis Family History High cholesterol Family History Hypertension Family History of Sudden Death Family History of Cardiovascular disorder Family History Diabetes 1st degree relative No FH of Colon Cancer: CVA mom recently  in her 56ss  Father died Multiple Myeloma   Social History: Reviewed history from 03/20/2010 and no changes required. 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     Impression & Recommendations:  Problem # 1:  DEPRESSION (ICD-311) some reactive component .    husband  related that  she doesnt really need meds  and that meds causing a problem . she seem some better today.    ther are a number of external changes in thepast years  that could trigger issues.  would benefit from counseling and poss cbt with above. To help  decide on med managment.  Her updated medication list for this problem includes:    Cymbalta 60 Mg Cpep (Duloxetine hcl) .Marland Kitchen... 1 by mouth once daily    Cymbalta 30 Mg Cpep (Duloxetine hcl) .Marland Kitchen... Wean as directed  Problem # 2:  ADVERSE REACTION TO MEDICATION (ZOX-096.04) ? if   not feeling as well from higher dose or under treatment for depression and other factors.     Problem # 3:  KNEE PAIN, BILATERAL (ICD-719.46) disc cross training  Her updated medication list for this problem includes:    Meloxicam 7.5 Mg Tabs (Meloxicam) .Marland Kitchen... 1 by mouth two times a day    Hydrocodone-acetaminophen 5-325 Mg Tabs (Hydrocodone-acetaminophen) .Marland Kitchen... 1-2 by mouth q4-6 hours for severe pain  Complete Medication List: 1)  Cymbalta 60 Mg Cpep (Duloxetine hcl) .Marland Kitchen.. 1 by mouth once daily 2)  Premarin 0.625 Mg/gm Crea (Estrogens, conjugated) .... Use twice a week as directed 3)  Vitamin D3 1000 Unit Tabs (Cholecalciferol) .... Take 1 tablet by mouth once a day 4)  B Complex Tabs (B complex vitamins) 5)  Meloxicam 7.5 Mg Tabs (Meloxicam) .Marland Kitchen..  1 by mouth two times a day 6)  Hydrocodone-acetaminophen 5-325 Mg Tabs (Hydrocodone-acetaminophen) .Marland Kitchen.. 1-2 by mouth q4-6 hours for severe pain 7)  Cymbalta 30 Mg Cpep (Duloxetine hcl) .... Wean as directed  Other Orders: Admin 1st Vaccine (54098) Flu Vaccine 32yrs + (11914)  Hypertension Assessment/Plan:      The patient's hypertensive risk group is category B: At least one risk factor (excluding diabetes) with no target organ damage.  Her calculated 10 year risk of coronary heart disease is 7 %.  Today's blood pressure is 120/80.  Her blood pressure goal is < 140/90. Flu Vaccine Consent Questions     Do you have a history of severe allergic reactions to this vaccine? no    Any prior history of allergic reactions to egg and/or gelatin? no    Do you have a sensitivity to the preservative Thimersol? no  Do you have a past history of Guillan-Barre Syndrome? no    Do you currently have an acute febrile illness? no    Have you ever had a severe reaction to latex? no    Vaccine information given and explained to patient? yes    Are you currently pregnant? no    Lot Number:[id number]   Exp Date:02/22/2011   Site Given  Left Deltoid IM Romualdo Bolk, CMA (AAMA)  May 20, 2010 2:05 PM   Patient Instructions: 1)  decrease   to 60 mg  once daily  for a  10 - 14 days    2)  then    alternating 60 mg 30 mg days  and then call after  2-3 weeks and then make a plan.Marland Kitchen    3)  Rec counseling    as adjunctive  therapy to help situatioin.   4)  can call with names  in  network for advice  5)      samples given of 30 and 60 cymbalta.   greater than 50% of visit spent in counseling  with patient and husband  30 minutes  WKP.   Marland Kitchenlbflu

## 2010-09-24 NOTE — Assessment & Plan Note (Signed)
Summary: rash//ccm   Vital Signs:  Patient profile:   60 year old female Menstrual status:  hysterectomy Weight:      150 pounds Temp:     98.2 degrees F oral BP sitting:   120 / 70  (right arm) CC: Rash right arm for 10 days, some swelling, maybe nerves?   History of Present Illness: Pamela [name] comesin comes in today  for above  Onset  with bumps that were mildly itchy after holding dog . then brodke out more on arem and spread and then on right hip area.  no fever pain and minimally itchy now. using Benadryl cream tav and calamone lotion  .        No OTC HCS because says makes her rashes worse.     Has had similar rash a while bace ? rom stress and rx with prednisone.  No new meds  , no fever resp or other rashes .  Preventive Screening-Counseling & Management  Alcohol-Tobacco     Alcohol drinks/day: <1     Alcohol type: wine     >5/day in last 3 mos: socially     Smoking Status: never     Passive Smoke Exposure: no  Caffeine-Diet-Exercise     Caffeine use/day: 3     Does Patient Exercise: yes     Type of exercise: cardio, strength and yoga     Exercise (avg: min/session): 30-60     Times/week: 5  Current Medications (verified): 1)  Cymbalta 60 Mg  Cpep (Duloxetine Hcl) .Marland Kitchen.. 1 By Mouth Once Daily 2)  Premarin 0.625 Mg/gm Crea (Estrogens, Conjugated) .... Use Twice A Week As Directed 3)  Vitamin D3 1000 Unit Tabs (Cholecalciferol) .... Take 1 Tablet By Mouth Once A Day 4)  B Complex  Tabs (B Complex Vitamins)  Allergies (verified): 1)  ! Morphine 2)  Simvastatin  Past History:  Past medical, surgical, family and social histories (including risk factors) reviewed for relevance to current acute and chronic problems.  Past Medical History: Reviewed history from 12/10/2009 and no changes required. High Cholesterol Depression ?Hypertension hx of c difficile recurrent vaginitis atrophic  G0P0 Dexa  2005  and 2008 normal IBS  Past Surgical History: Reviewed  history from 02/18/2008 and no changes required. NCST Hysterectomy total benign ovary tumors  Tonsillectomy Carpal tunnel release right  Family History: Reviewed history from 03/19/2009 and no changes required. Family History of Arthritis Family History High cholesterol Family History Hypertension Family History of Sudden Death Family History of Cardiovascular disorder Family History Diabetes 1st degree relative No FH of Colon Cancer: CVA mom recently  in her 63ss  Father died Multiple Myeloma   Social History: Reviewed history from 12/10/2009 and no changes required. Retired  Engineer, civil (consulting)  works as Engineer, technical sales prn Married Never Smoked Alcohol use-yes Drug use-no Regular exercise-no  Daily Caffeine Use 3 cups daily HH of 2   Physical Exam  General:  alert, well-developed, and well-nourished.   in NAD Skin:  right arm with 8 cn red papular rash with linera edges at points  and a few satellite bumps  no vesicles weeping or blisters or crusting.  aslos small red patch right hip and anticubital area.    Cervical Nodes:  No lymphadenopathy noted Psych:  Oriented X3, good eye contact, not anxious appearing, and not depressed appearing.     Impression & Recommendations:  Problem # 1:  CONTACT DERMATITIS (ICD-692.9)  ? pattern looks like contact cause but  not  that itchy . says cant take the HCS topical otc   will rx with prednisone and call if persistent or  progressive  .     reviewed record and was rx for same in 2008 time.  Her updated medication list for this problem includes:    Prednisone 20 Mg Tabs (Prednisone) .Marland Kitchen... Take 3 a day x 3 days then 2 a day x3 days then 1/day x 3 days, then 1/2 a day x 3 days or as directed.  Complete Medication List: 1)  Cymbalta 60 Mg Cpep (Duloxetine hcl) .Marland Kitchen.. 1 by mouth once daily 2)  Premarin 0.625 Mg/gm Crea (Estrogens, conjugated) .... Use twice a week as directed 3)  Vitamin D3 1000 Unit Tabs (Cholecalciferol) .... Take 1  tablet by mouth once a day 4)  B Complex Tabs (B complex vitamins) 5)  Prednisone 20 Mg Tabs (Prednisone) .... Take 3 a day x 3 days then 2 a day x3 days then 1/day x 3 days, then 1/2 a day x 3 days or as directed.  Patient Instructions: 1)  this still looks  like a contacat dermatitis 2)  take the prednisone and taper as appropriate. 3)  if getting fever or blisters  or infected looking  discharge call . Prescriptions: PREDNISONE 20 MG TABS (PREDNISONE) Take 3 a day x 3 days then 2 a day x3 days then 1/day x 3 days, then 1/2 a day x 3 days or as directed.  #30 x 0   Entered and Authorized by:   Madelin Headings MD   Signed by:   Madelin Headings MD on 01/09/2010   Method used:   Electronically to        [facility] (retail)       [address]       [place], [place]  [address]       Ph: [phone] or [phone]       Fax: [phone]   RxID:   [id number]

## 2010-09-24 NOTE — Letter (Signed)
Summary: Generic Letter  [place] [facility]  [address] [place], [place] [address]   Phone: [phone]  Fax: [phone]    06/21/2010  Pamela [name] [address] [place], [place]  [address]  Dear Pamela [name],  (1) CBC with Diff (10010)   WBC                       7.6 K/uL                    4.0-10.5   RBC                       4.33 MIL/uL                 3.87-5.11   Hemoglobin           [L]  11.9 g/dL                   13.2-44.0   Hematocrit           [L]  35.6 %                      36.0-46.0   MCV                       82.2 fL                     78.0-100.0 ! MCH                       27.5 pg                     26.0-34.0   MCHC                      33.4 g/dL                   10.2-72.5   RDW                       13.6 %                      11.5-15.5   Platelet Count            222 K/uL                    150-400   Granulocyte %             61 %                        43-77   Absolute Gran             4.6 K/uL                    1.7-7.7   Lymph %                   32 %                        12-46   Absolute Lymph            2.4 K/uL  0.7-4.0   Mono %                    6 %                         3-12   Absolute Mono             0.5 K/uL                    0.1-1.0   Eos %                     1 %                         0-5   Absolute Eos              0.0 K/uL                    0.0-0.7   Baso %                    0 %                         0-1   Absolute Baso             0.0 K/uL                    0.0-0.1   Smear Review       RESULT: Criteria for review not met  Tests: (2) Creatinine (01027)   Creatinine                0.82 mg/dL                  0.40-1.20  Tests: (3) C3 and C4 (25366)   Complement C3        [L]  78 mg/dL                    44-034   Complement C4             20 mg/dL                    74-25  Tests: (4) Rheumatoid (RA) Factor (95638)  Rheumatoid (RA) Factor                             < 20 IU/mL                   0-20  Tests: (5) Anti Nuclear Antibody (ANA) Reflex (23900)  Anti Nuclear Antibody (ANA)                             NEG                         NEGATIVE  Tests: (6) Anti DNA, Native Dble Strand (23920)  Anti DNA, Native Dble Strand                             <1 IU/mL                    <30                  <  30 IU/mL     Negative                  30-40 IU/mL     Equivocal                  >  40 IU/mL     Positive  Tests: (7) HLA B27 (83410) ! DNA Result:               See Comment     *** HLA-B27 Allele not detected ***           TEST INFORMATION:           The presence of HLA-B27 is not diagnostic of Ankylosing Spondylitis     but is associated with increased risk for AS or other related     autoimmune disorders (i.e. Reiter's syndrome).           HLA-B27 is found in 80-90% of patients with Ankylosing Spondylitis     (AS). HLA-B27 is also found in approximately 10% of patients without     autoimmune disease.           Ankylosing Spondylitis is a type of arthritis causing inflammation of     the joints of the spine affecting about 129 of 100,000 patients in the     [place] AS occurs more often in men than women with the onset of symptoms     between the ages of 49 to 40 years.           TEST METHOD:           This test was developed and its performance characteristics determined     by [facility]. It has not been cleared or approved by     the U.S. Food and Drug Administration (FDA). The FDA has determined     that such clearance or approval is not necessary. This test is used     for clinical purposes. It should not be regarded as investigational or     for research. This laboratory is certified under the Clinical     Laboratory Improvement Amendments of 1988 (CLIA-88) as qualified to     perform high complexity clinical laboratory testing.        Tests: (8) Smith (ENA) Antibody [phone])   Smith (ENA) Antibody      <1 AU/mL                    <30     *** Please  note change in reference range(s). ***   Your lab results are normal except for the borderline anemia. If you have any questions, please give me a call at [phone].    Sincerely,   Tor Netters, CMA (AAMA)

## 2010-09-24 NOTE — Letter (Signed)
Summary: [facility]  [facility]   Imported By: Maryln Gottron 06/24/2010 11:06:50  _____________________________________________________________________  External Attachment:    Type:   Image     Comment:   External Document

## 2010-09-24 NOTE — Assessment & Plan Note (Signed)
Summary: cpx/cjr   Vital Signs:  Patient profile:   60 year old female Menstrual status:  hysterectomy Height:      64.5 inches Weight:      154 pounds Pulse rate:   80 / minute BP sitting:   120 / 80  (left arm) Cuff size:   regular  Vitals Entered By: Romualdo Bolk, CMA (AAMA) (March 20, 2010 9:33 AM) CC: CPX- no pap   History of Present Illness: Pamela [name] comes in today  for preventive visit . Since last visit  here  there have been no major changes in health status  . Knee pain: hasd improved and now  worse again .  pain at night  ? why  has decrease exercise  .  so far.  HRT; vaginal cream working  Mood :  cymbalta helping    Preventive Care Screening  Prior Values:    Mammogram:  ASSESSMENT: Negative - BI-RADS 1^MM DIGITAL SCREENING (02/13/2010)    Colonoscopy:  Location:  [facility].   (12/06/2008)    Last Tetanus Booster:  Tdap (07/06/2007)   Preventive Screening-Counseling & Management  Alcohol-Tobacco     Alcohol drinks/day: <1     Alcohol type: wine     >5/day in last 3 mos: socially     Smoking Status: never     Passive Smoke Exposure: no  Caffeine-Diet-Exercise     Caffeine use/day: 3     Does Patient Exercise: yes     Type of exercise: cardio, strength and yoga     Exercise (avg: min/session): 30-60     Times/week: 5  Hep-HIV-STD-Contraception     Dental Visit-last 6 months yes     Sun Exposure-Excessive: no  Safety-Violence-Falls     Seat Belt Use: yes     Helmet Use: n/a     Firearms in the Home: firearms in the home     Firearm Counseling: not indicated; uses recommended firearm safety measures     Smoke Detectors: yes     Fall Risk: no risk   Current Medications (verified): 1)  Cymbalta 60 Mg  Cpep (Duloxetine Hcl) .Marland Kitchen.. 1 By Mouth Once Daily 2)  Premarin 0.625 Mg/gm Crea (Estrogens, Conjugated) .... Use Twice A Week As Directed 3)  Vitamin D3 1000 Unit Tabs (Cholecalciferol) .... Take 1 Tablet By Mouth Once A  Day 4)  B Complex  Tabs (B Complex Vitamins) 5)  Meloxicam 7.5 Mg Tabs (Meloxicam) .Marland Kitchen.. 1 By Mouth Two Times A Day 6)  Hydrocodone-Acetaminophen 5-325 Mg Tabs (Hydrocodone-Acetaminophen) .Marland Kitchen.. 1-2 By Mouth Q4-6 Hours For Severe Pain  Allergies (verified): 1)  ! Morphine 2)  Simvastatin  Past History:  Past medical, surgical, family and social histories (including risk factors) reviewed, and no changes noted (except as noted below).  Past Medical History: Reviewed history from 03/13/2010 and no changes required. High Cholesterol Depression ?Hypertension hx of c difficile recurrent vaginitis atrophic  G0P0 Dexa  2005  and 2008 normal IBS Bursistis hip  Past Surgical History: Reviewed history from 02/18/2008 and no changes required. NCST Hysterectomy total benign ovary tumors  Tonsillectomy Carpal tunnel release right  Past History:  Care Management: Gastroenterology: [facility] Jarold Motto Orthopedics: Charlann Boxer Dermatology: Danella Deis  Family History: Reviewed history from 03/19/2009 and no changes required. Family History of Arthritis Family History High cholesterol Family History Hypertension Family History of Sudden Death Family History of Cardiovascular disorder Family History Diabetes 1st degree relative No FH of Colon Cancer: CVA mom recently  in  her 90ss  Father died Multiple Myeloma   Social History: Reviewed history from 12/10/2009 and no changes required. Retired  Engineer, civil (consulting)  works as Engineer, technical sales as needed [employer]  Married Never Smoked Alcohol use-yes Drug use-no Regular exercise-yes but less with knee pain Daily Caffeine Use 3 cups daily HH of 2  Fall Risk:  no risk   Review of Systems  The patient denies anorexia, fever, weight loss, weight gain, vision loss, decreased hearing, hoarseness, chest pain, syncope, dyspnea on exertion, peripheral edema, prolonged cough, headaches, hemoptysis, abdominal pain, melena, hematochezia, severe  indigestion/heartburn, hematuria, incontinence, genital sores, muscle weakness, suspicious skin lesions, difficulty walking, depression, abnormal bleeding, enlarged lymph nodes, angioedema, and breast masses.         gained some weight with eating more  Physical Exam General Appearance: well developed, well nourished, no acute distress Eyes: conjunctiva and lids normal, PERRLA, EOMI,  WNL Ears, Nose, Mouth, Throat: TM clear, nares clear, oral exam WNL Neck: supple, no lymphadenopathy, no thyromegaly, no JVD Respiratory: clear to auscultation and percussion, respiratory effort normal Cardiovascular: regular rate and rhythm, S1-S2, no murmur, rub or gallop, no bruits, peripheral pulses normal and symmetric, no cyanosis, clubbing, edema or varicosities Chest: no scars, masses, tenderness; no asymmetry, skin changes, nipple discharge   Gastrointestinal: soft, non-tender; no hepatosplenomegaly, masses; active bowel sounds all quadrants,  Lymphatic: no cervical, axillary or inguinal adenopathy Musculoskeletal: gait normal, muscle tone and strength WNL, no joint swelling, effusions, discoloration, crepitus  Skin: clear, good turgor, color WNL, no rashes, lesions, or ulcerations Neurologic: normal mental status, normal reflexes, normal strength, sensation, and motion Psychiatric: alert; oriented to person, place and time Other Exam:  EKG  nsr .  rate  69  nl intervals     Impression & Recommendations:  Problem # 1:  PREVENTIVE HEALTH CARE (ICD-V70.0)  Discussed nutrition,exercise,diet,healthy weight, vitamin D and calcium. go back to   water aerobics   Orders: EKG w/ Interpretation (93000)  Problem # 2:  HYPERLIPIDEMIA (ICD-272.2) lifestyle intervention  Labs Reviewed: SGOT: 21 (03/13/2010)   SGPT: 16 (03/13/2010)  Lipid Goals: Chol Goal: 200 (02/18/2008)   HDL Goal: 40 (02/18/2008)   LDL Goal: 130 (02/18/2008)   TG Goal: 150 (02/18/2008)  Prior 10 Yr Risk Heart Disease: 7 %  (12/10/2009)   HDL:55.50 (03/13/2010), 58.20 (05/10/2009)  LDL:93 (05/10/2009), DEL (46/96/2952)  Chol:234 (03/13/2010), 167 (05/10/2009)  Trig:112.0 (03/13/2010), 80.0 (05/10/2009)  Problem # 3:  DEPRESSION (ICD-311) Assessment: Unchanged  doing well  Her updated medication list for this problem includes:    Cymbalta 60 Mg Cpep (Duloxetine hcl) .Marland Kitchen... 1 by mouth once daily  Discussed treatment options, including trial of antidpressant medication. Will refer to behavioral health. Follow-up call in in 24-48 hours and recheck in 2 weeks, sooner as needed. Patient agrees to call if any worsening of symptoms or thoughts of doing harm arise. Verified that the patient has no suicidal ideation at this time.   Problem # 4:  KNEE PAIN, BILATERAL (ICD-719.46) waxing and waning  worse today will check on referral .     The following medications were removed from the medication list:    Nabumetone 500 Mg Tabs (Nabumetone) Her updated medication list for this problem includes:    Meloxicam 7.5 Mg Tabs (Meloxicam) .Marland Kitchen... 1 by mouth two times a day    Hydrocodone-acetaminophen 5-325 Mg Tabs (Hydrocodone-acetaminophen) .Marland Kitchen... 1-2 by mouth q4-6 hours for severe pain  Problem # 5:  LEUKOPENIA, MILD (ICD-288.50) minimal but borderline hct  nl hg     prob insignificance .     will repeat at follow up .    and med check   Complete Medication List: 1)  Cymbalta 60 Mg Cpep (Duloxetine hcl) .Marland Kitchen.. 1 by mouth once daily 2)  Premarin 0.625 Mg/gm Crea (Estrogens, conjugated) .... Use twice a week as directed 3)  Vitamin D3 1000 Unit Tabs (Cholecalciferol) .... Take 1 tablet by mouth once a day 4)  B Complex Tabs (B complex vitamins) 5)  Meloxicam 7.5 Mg Tabs (Meloxicam) .Marland Kitchen.. 1 by mouth two times a day 6)  Hydrocodone-acetaminophen 5-325 Mg Tabs (Hydrocodone-acetaminophen) .Marland Kitchen.. 1-2 by mouth q4-6 hours for severe pain  Patient Instructions: 1)  recheck cbc diff in 6 months and then return office visit for a med  check . 2)  Dx 288.0 Prescriptions: PREMARIN 0.625 MG/GM CREA (ESTROGENS, CONJUGATED) use twice a week as directed  #42.5 x 6   Entered and Authorized by:   Madelin Headings MD   Signed by:   Madelin Headings MD on 03/20/2010   Method used:   Electronically to        [facility] (retail)       [address]       [place], [place]  [address]       Ph: [phone] or [phone]       Fax: [phone]   RxID:   [id number] CYMBALTA 60 MG  CPEP (DULOXETINE HCL) 1 by mouth once daily  #30 x 6   Entered and Authorized by:   Madelin Headings MD   Signed by:   Madelin Headings MD on 03/20/2010   Method used:   Electronically to        [facility] (retail)       [address]       [place], [place]  [address]       Ph: [phone] or [phone]       Fax: [phone]   RxID:   [id number]

## 2010-09-24 NOTE — Progress Notes (Signed)
Summary: samples of cymbalta  Phone Note Call from Patient Call back at Home Phone [phone]   Caller: Patient Summary of Call: Pt would like some samples of cymbalta 30mg  to get her thru until her appt in 2 weeks. Initial call taken by: Romualdo Bolk, CMA (AAMA),  May 08, 2010 11:08 AM  Follow-up for Phone Call        please give her some   if we have these  Follow-up by: Madelin Headings MD,  May 08, 2010 2:26 PM  Additional Follow-up for Phone Call Additional follow up Details #1::        Pt aware that samples are up front. Additional Follow-up by: Romualdo Bolk, CMA Duncan Dull),  May 08, 2010 2:39 PM

## 2010-09-24 NOTE — Progress Notes (Signed)
Summary: Pt having Orthopedic trouble. Call to discuss Simvastatin  Phone Note Call from Patient Call back at Home Phone [phone] Call back at [phone] cell   Caller: Patient Summary of Call: Pt called and said that she is having Orthopedic trouble,and her orthopedist recommended that pt stays off of Simvastatin for 3 months to see if her condition improves. Pt is wanting to get Dr Jerolyn Shin permission.  Initial call taken by: Lucy Antigua,  November 08, 2009 10:58 AM  Follow-up for Phone Call        [facility] to do this but I need  copy of notes consultation  regarding this .  then return office visit  or call after off for a month .   to plan follow up . Follow-up by: Madelin Headings MD,  November 09, 2009 11:10 AM  Additional Follow-up for Phone Call Additional follow up Details #1::        Pt notified.  May stop Simvastatin and will bring office notes to confirm this and see Dr. Fabian Sharp in one month. Additional Follow-up by: Lynann Beaver CMA,  November 09, 2009 4:32 PM

## 2010-09-24 NOTE — Assessment & Plan Note (Signed)
Summary: shingles/ssc  Nurse Visit   Allergies: 1)  ! Morphine 2)  Simvastatin  Immunizations Administered:  Zostavax # 1:    Vaccine Type: Zostavax    Site: left deltoid    Mfr: Merck    Dose: 0.5 ml    Route: Susan Moore    Given by: Romualdo Bolk, CMA (AAMA)    Exp. Date: 03/29/2011    Lot #: [id number]    VIS given: 06/06/05 given April 11, 2010.  Orders Added: 1)  Zoster (Shingles) Vaccine Live [90736] 2)  Admin 1st Vaccine [phone]

## 2010-09-24 NOTE — Assessment & Plan Note (Signed)
Summary: cpx labs/v70.0/LEG/KNEE/PAIN/@9 :15AM/RCD   Vital Signs:  Patient profile:   60 year old female Menstrual status:  hysterectomy Weight:      152 pounds Pulse rate:   66 / minute BP sitting:   120 / 80  (left arm) Cuff size:   regular  Vitals Entered By: Romualdo Bolk, CMA (AAMA) (March 13, 2010 8:45 AM) CC: Bilateral knee pain that is constant. Pt states that a few days ago the pain was so bad that they were jerking. Pt has taken relafen which didn't help. This has been going on for 3 weeks now.   History of Present Illness: Pamela [name] comes in today  for acute work in for above problems. Onset of  some what  acute bilateral knee pain  a few weeks ago  during a Zumba class  and didnt continue   and thought it was from exercise program.  So the backed off her regular routine of exercise and swimming   and took the relefan that she had had before for her hip bursitis but     doidnt help and her pain continues   . Described as " Hurt all the time and severe  so she cannot sleep. NO fever other swellings   hx of bursisits in hips   but not different. No groin pain .  Has seen Dr Charlann Boxer in the past.  Little  bit of hand soreness.  GI stable  .   Preventive Screening-Counseling & Management  Alcohol-Tobacco     Alcohol drinks/day: <1     Alcohol type: wine     >5/day in last 3 mos: socially     Smoking Status: never     Passive Smoke Exposure: no  Caffeine-Diet-Exercise     Caffeine use/day: 3     Does Patient Exercise: yes     Type of exercise: cardio, strength and yoga     Exercise (avg: min/session): 30-60     Times/week: 5  Current Medications (verified): 1)  Cymbalta 60 Mg  Cpep (Duloxetine Hcl) .Marland Kitchen.. 1 By Mouth Once Daily 2)  Premarin 0.625 Mg/gm Crea (Estrogens, Conjugated) .... Use Twice A Week As Directed 3)  Vitamin D3 1000 Unit Tabs (Cholecalciferol) .... Take 1 Tablet By Mouth Once A Day 4)  B Complex  Tabs (B Complex Vitamins) 5)  Nabumetone 500 Mg Tabs  (Nabumetone)  Allergies (verified): 1)  ! Morphine 2)  Simvastatin  Past History:  Past medical, surgical, family and social histories (including risk factors) reviewed, and no changes noted (except as noted below).  Past Medical History: High Cholesterol Depression ?Hypertension hx of c difficile recurrent vaginitis atrophic  G0P0 Dexa  2005  and 2008 normal IBS Bursistis hip  Past Surgical History: Reviewed history from 02/18/2008 and no changes required. NCST Hysterectomy total benign ovary tumors  Tonsillectomy Carpal tunnel release right  Past History:  Care Management: Gastroenterology: [facility] Jarold Motto Orthopedics: Charlann Boxer Dermatology: Danella Deis  Family History: Reviewed history from 03/19/2009 and no changes required. Family History of Arthritis Family History High cholesterol Family History Hypertension Family History of Sudden Death Family History of Cardiovascular disorder Family History Diabetes 1st degree relative No FH of Colon Cancer: CVA mom recently  in her 59ss  Father died Multiple Myeloma   Social History: Reviewed history from 12/10/2009 and no changes required. Retired  Engineer, civil (consulting)  works as Engineer, technical sales prn Married Never Smoked Alcohol use-yes Drug use-no Regular exercise-no  Daily Caffeine Use 3 cups daily HH of  2   Review of Systems  The patient denies anorexia, fever, weight loss, weight gain, abdominal pain, melena, hematochezia, severe indigestion/heartburn, transient blindness, abnormal bleeding, enlarged lymph nodes, and angioedema.    Physical Exam  General:  alert, well-developed, and well-nourished.   Head:  normocephalic and atraumatic.   Msk:  no joint warmth and no redness over joints.   bilateral crepitus and full rom of knees  Joint line tenderness/   stable / minor swelling   gait slightly stiff but no limp  Hips  ok .   Pulses:  pulses intact without delay   Extremities:  no clubbing cyanosis or edema    Neurologic:  alert & oriented X3 and strength normal in all extremities.  grossly non focal  Skin:  turgor normal, color normal, no ecchymoses, and no petechiae.   Cervical Nodes:  No lymphadenopathy noted Psych:  Oriented X3, good eye contact, not anxious appearing, and not depressed appearing.     Impression & Recommendations:  Problem # 1:  KNEE PAIN, BILATERAL (ICD-719.46)  sub acute bilateral onset ? cause   would have felt that if overuse this should be getting better.     her pain is sig enough to have her stop exercising  and more severe than  one would suspect by hx   exp with  nocturnal pain.     rec change nsaid and  see ortho  and if no dx consider rheum eval .     add esr crp to labs today if possible.   .   Her updated medication list for this problem includes:    Nabumetone 500 Mg Tabs (Nabumetone)    Meloxicam 7.5 Mg Tabs (Meloxicam) .Marland Kitchen... 1 by mouth two times a day    Hydrocodone-acetaminophen 5-325 Mg Tabs (Hydrocodone-acetaminophen) .Marland Kitchen... 1-2 by mouth q4-6 hours for severe pain  Orders: Orthopedic Referral (Ortho)  Complete Medication List: 1)  Cymbalta 60 Mg Cpep (Duloxetine hcl) .Marland Kitchen.. 1 by mouth once daily 2)  Premarin 0.625 Mg/gm Crea (Estrogens, conjugated) .... Use twice a week as directed 3)  Vitamin D3 1000 Unit Tabs (Cholecalciferol) .... Take 1 tablet by mouth once a day 4)  B Complex Tabs (B complex vitamins) 5)  Nabumetone 500 Mg Tabs (Nabumetone) 6)  Meloxicam 7.5 Mg Tabs (Meloxicam) .Marland Kitchen.. 1 by mouth two times a day 7)  Hydrocodone-acetaminophen 5-325 Mg Tabs (Hydrocodone-acetaminophen) .Marland Kitchen.. 1-2 by mouth q4-6 hours for severe pain  Patient Instructions: 1)  take antiinflammatory    as we discussed   and will  refer to  ortho. 2)  Someone will contact you about appt.  Prescriptions: HYDROCODONE-ACETAMINOPHEN 5-325 MG TABS (HYDROCODONE-ACETAMINOPHEN) 1-2 by mouth q4-6 hours for severe pain  #15 x 0   Entered and Authorized by:   Madelin Headings MD    Signed by:   Madelin Headings MD on 03/13/2010   Method used:   Print then Give to Patient   RxID:   [id number] MELOXICAM 7.5 MG TABS (MELOXICAM) 1 by mouth two times a day  #60 x 1   Entered and Authorized by:   Madelin Headings MD   Signed by:   Madelin Headings MD on 03/13/2010   Method used:   Electronically to        [facility]. [facility] (retail)       [address]       [place], [place]  [address]  Ph: [phone] or [phone]       Fax: [phone]   RxID:   [id number]

## 2010-09-24 NOTE — Progress Notes (Signed)
Summary: refills  Phone Note From Pharmacy   Caller: [facility] Reason for Call: Needs renewal Details for Reason: Cymbalta 60mg  Initial call taken by: Romualdo Bolk, CMA Duncan Dull),  Jan 07, 2010 2:32 PM  Follow-up for Phone Call        Labs and cpx in July Rx sent to pharmacy Follow-up by: Romualdo Bolk, CMA Duncan Dull),  Jan 07, 2010 2:33 PM    Prescriptions: CYMBALTA 60 MG  CPEP (DULOXETINE HCL) 1 by mouth once daily  #30 x 2   Entered by:   Romualdo Bolk, CMA (AAMA)   Authorized by:   Madelin Headings MD   Signed by:   Romualdo Bolk, CMA (AAMA) on 01/07/2010   Method used:   Electronically to        [facility] (retail)       [address]       [place], [place]  [address]       Ph: [phone] or [phone]       Fax: [phone]   RxID:   [id number]

## 2010-09-24 NOTE — Assessment & Plan Note (Signed)
Summary: 1 MTH F/U AFTER D/C'G MED // RS   Vital Signs:  Patient profile:   60 year old female Menstrual status:  hysterectomy Height:      64 inches Weight:      157 pounds BMI:     27.05 Pulse rate:   66 / minute BP sitting:   120 / 80  (right arm) Cuff size:   regular  Vitals Entered By: Romualdo Bolk, CMA (AAMA) (December 10, 2009 1:15 PM) CC: Follow-up visit on meds- Pt d/c zocor 1 month ago., Hypertension Management   History of Present Illness: Pamela [name] comesin after dc ing simvastatin per direction of her orthopedist.Didnt feel well and had weight loss   and drowsy and hip pain.    hard to sit at school at that time .  Had hx of bursitis  .  But this time  cortisone shots didnt help and got worse  .          Since that time   about    85% -90 %  better   stopped librax  and simvastatin    and helped a good bit.  back to  water exercises.  HT:   Nl  Gi  better   not perfect.  but tolerable and feels pretty good.    Hypertension History:      She complains of side effects from treatment, but denies headache, chest pain, palpitations, dyspnea with exertion, orthopnea, PND, peripheral edema, visual symptoms, neurologic problems, and syncope.  She notes no problems with any antihypertensive medication side effects.  zocor.        Positive major cardiovascular risk factors include female age 76 years old or older, hyperlipidemia, and hypertension.  Negative major cardiovascular risk factors include non-tobacco-user status.     Preventive Screening-Counseling & Management  Alcohol-Tobacco     Alcohol drinks/day: <1     Alcohol type: wine     >5/day in last 3 mos: socially     Smoking Status: never     Passive Smoke Exposure: no  Caffeine-Diet-Exercise     Caffeine use/day: 3     Does Patient Exercise: yes     Type of exercise: cardio, strength and yoga     Exercise (avg: min/session): 30-60     Times/week: 5  Current Medications (verified): 1)  Cymbalta 60 Mg   Cpep (Duloxetine Hcl) .Marland Kitchen.. 1 By Mouth Once Daily 2)  Premarin 0.625 Mg/gm Crea (Estrogens, Conjugated) .... Use Twice A Week As Directed 3)  Vitamin D3 1000 Unit Tabs (Cholecalciferol) .... Take 1 Tablet By Mouth Once A Day 4)  B Complex  Tabs (B Complex Vitamins)  Allergies (verified): 1)  ! Morphine 2)  Simvastatin  Past History:  Past Medical History: High Cholesterol Depression ?Hypertension hx of c difficile recurrent vaginitis atrophic  G0P0 Dexa  2005  and 2008 normal IBS  Past History:  Care Management: Gastroenterology: Corinda G[facility]ler [facility] Jarold Motto Orthopedics: Charlann Boxer Dermatology: Danella Deis  Social History: Retired  Engineer, civil (consulting)  works as Engineer, technical sales prn Married Never Smoked Alcohol use-yes Drug use-no Regular exercise-no  Daily Caffeine Use 3 cups daily HH of 2   Review of Systems  The patient denies anorexia, fever, prolonged cough, melena, hematochezia, abnormal bleeding, enlarged lymph nodes, and angioedema.    Physical Exam  General:  alert, well-developed, and well-nourished.   Head:  normocephalic and atraumatic.   Neck:  No deformities, masses, or tenderness noted. Lungs:  Normal respiratory effort, chest expands  symmetrically. Lungs are clear to auscultation, no crackles or wheezes. Heart:  Normal rate and regular rhythm. S1 and S2 normal without gallop, murmur, click, rub or other extra sounds. Abdomen:  Bowel sounds positive,abdomen soft and non-tender without masses, organomegaly or   noted. Extremities:  no clubbing cyanosis or edema  Neurologic:  non  focal Skin:  turgor normal, color normal, no ecchymoses, and no petechiae.   Cervical Nodes:  No lymphadenopathy noted Psych:  Oriented X3, good eye contact, not anxious appearing, and not depressed appearing.     Impression & Recommendations:  Problem # 1:  HYPERLIPIDEMIA (ICD-272.2) consider pravachol in future if needed but for now  congtinue without the med and check at cpx.  The  following medications were removed from the medication list:    Zocor 20 Mg Tabs (Simvastatin) .Marland Kitchen... 1 by mouth once daily  Problem # 2:  ADVERSE REACTION TO MEDICATION (ICD-995.29) simvastatin and librax   doing much better   Problem # 3:  IBS (ICD-564.1) Assessment: Comment Only  Problem # 4:  ELEVATED BLOOD PRESSURE WITHOUT DIAGNOSIS OF HYPERTENSION (ICD-796.2) Assessment: Improved  BP today: 120/80 Prior BP: 120/80 (05/16/2009)  10 Yr Risk Heart Disease: 7 % Prior 10 Yr Risk Heart Disease: Not enough information (02/18/2008)  Labs Reviewed: Creat: 0.7 (03/12/2009) Chol: 167 (05/10/2009)   HDL: 58.20 (05/10/2009)   LDL: 93 (05/10/2009)   TG: 80.0 (05/10/2009)  Instructed in low sodium diet (DASH Handout) and behavior modification.    Complete Medication List: 1)  Cymbalta 60 Mg Cpep (Duloxetine hcl) .Marland Kitchen.. 1 by mouth once daily 2)  Premarin 0.625 Mg/gm Crea (Estrogens, conjugated) .... Use twice a week as directed 3)  Vitamin D3 1000 Unit Tabs (Cholecalciferol) .... Take 1 tablet by mouth once a day 4)  B Complex Tabs (B complex vitamins)  Hypertension Assessment/Plan:      The patient's hypertensive risk group is category B: At least one risk factor (excluding diabetes) with no target organ damage.  Her calculated 10 year risk of coronary heart disease is 7 %.  Today's blood pressure is 120/80.  Her blood pressure goal is < 140/90.  Patient Instructions: 1)  continue  keep cpx with labs and then decide whether to  restart a med.

## 2010-09-26 NOTE — Assessment & Plan Note (Signed)
Summary: 6 month follow up/cjr   Vital Signs:  Patient profile:   60 year old female Menstrual status:  hysterectomy Height:      64.5 inches Weight:      169 pounds BMI:     28.66 Pulse rate:   78 / minute BP sitting:   120 / 80  (left arm) Cuff size:   regular  Vitals Entered By: Romualdo Bolk, CMA (AAMA) (September 20, 2010 1:34 PM) CC: Follow-up visit on labs, Hypertension Management   History of Present Illness: Pamela [name] comes in today    for follow up of multiple medical problems ..  gained weight from lack of attention to exercise   from   knee pain.   hasnt pick it up.  so gaining weight.   [id number] without se  Mood:  stable   on current dose  60 mg  no  new se.  Anemia  no bleeding     Hypertension History:      She denies headache, chest pain, palpitations, dyspnea with exertion, orthopnea, PND, peripheral edema, visual symptoms, neurologic problems, syncope, and side effects from treatment.  She notes no problems with any antihypertensive medication side effects.        Positive major cardiovascular risk factors include female age 43 years old or older, hyperlipidemia, and hypertension.  Negative major cardiovascular risk factors include non-tobacco-user status.     Preventive Screening-Counseling & Management  Alcohol-Tobacco     Alcohol drinks/day: <1     Alcohol type: wine     >5/day in last 3 mos: socially     Smoking Status: never     Passive Smoke Exposure: no  Caffeine-Diet-Exercise     Caffeine use/day: 3     Does Patient Exercise: yes     Type of exercise: cardio, strength and yoga     Exercise (avg: min/session): 30-60     Times/week: 5  Current Medications (verified): 1)  Cymbalta 60 Mg  Cpep (Duloxetine Hcl) .Marland Kitchen.. 1 By Mouth Once Daily 2)  Premarin 0.625 Mg/gm Crea (Estrogens, Conjugated) .... Use Twice A Week As Directed 3)  Vitamin D3 1000 Unit Tabs (Cholecalciferol) .... Take 1 Tablet By Mouth Once A Day 4)  B Complex  Tabs (B  Complex Vitamins) 5)  Meloxicam 7.5 Mg Tabs (Meloxicam) .Marland Kitchen.. 1 By Mouth Two Times A Day  Allergies (verified): 1)  ! Morphine 2)  Simvastatin  Past History:  Past medical, surgical, family and social histories (including risk factors) reviewed for relevance to current acute and chronic problems.  Past Medical History: Reviewed history from 03/13/2010 and no changes required. High Cholesterol Depression ?Hypertension hx of c difficile recurrent vaginitis atrophic  G0P0 Dexa  2005  and 2008 normal IBS Bursistis hip  Past Surgical History: Reviewed history from 02/18/2008 and no changes required. NCST Hysterectomy total benign ovary tumors  Tonsillectomy Carpal tunnel release right  Past History:  Care Management: Gastroenterology: Corinda G[facility] Jarold Motto Orthopedics: Charlann Boxer Dermatology: Danella Deis  Family History: Reviewed history from 03/19/2009 and no changes required. Family History of Arthritis Family History High cholesterol Family History Hypertension Family History of Sudden Death Family History of Cardiovascular disorder Family History Diabetes 1st degree relative No FH of Colon Cancer: CVA mom recently  in her 63ss  Father died Multiple Myeloma   Social History: Reviewed history from 05/20/2010 and no changes required. Retired  Engineer, civil (consulting)  works as Engineer, technical sales as needed [employer]   working am only Married Never  Smoked Alcohol use-yes Drug use-no Regular exercise-yes but less with knee pain Daily Caffeine Use 3 cups daily father died last year  HH of 2   Review of Systems       see hpin  no cv pulm  signs no bleeding  Physical Exam  General:  Well-developed,well-nourished,in no acute distress; alert,appropriate and cooperative throughout examination Head:  normocephalic and atraumatic.   Neck:  No deformities, masses, or tenderness noted. Lungs:  Normal respiratory effort, chest expands symmetrically. Lungs are clear to  auscultation, no crackles or wheezes. Heart:  Normal rate and regular rhythm. S1 and S2 normal without gallop, murmur, click, rub or other extra sounds. Abdomen:  Bowel sounds positive,abdomen soft and non-tender without masses, organomegaly or  noted. Pulses:  pulses intact without delay   Skin:  turgor normal, color normal, no ecchymoses, and no petechiae.   Cervical Nodes:  No lymphadenopathy noted Psych:  Oriented X3, good eye contact, not anxious appearing, and not depressed appearing.     Impression & Recommendations:  Problem # 1:  HYPERTENSION (ICD-401.9)  controlled no se of meds   BP today: 120/80 Prior BP: 110/60 (07/10/2010)  Prior 10 Yr Risk Heart Disease: 7 % (12/10/2009)  Labs Reviewed: K+: 4.7 (03/13/2010) Creat: : 0.82 (06/17/2010)   Chol: 234 (03/13/2010)   HDL: 55.50 (03/13/2010)   LDL: 93 (05/10/2009)   TG: 112.0 (03/13/2010)  Problem # 2:  LEUKOPENIA, MILD (ICD-288.50) Assessment: Improved anemia  resolved  Problem # 3:  KNEE PAIN, BILATERAL (ICD-719.46) Assessment: Unchanged limits land exercise  Her updated medication list for this problem includes:    Meloxicam 7.5 Mg Tabs (Meloxicam) .Marland Kitchen... 1 by mouth two times a day  Problem # 4:  DEPRESSION (ICD-311) Assessment: Improved stable on this dose of med Her updated medication list for this problem includes:    Cymbalta 60 Mg Cpep (Duloxetine hcl) .Marland Kitchen... 1 by mouth once daily  Complete Medication List: 1)  Cymbalta 60 Mg Cpep (Duloxetine hcl) .Marland Kitchen.. 1 by mouth once daily 2)  Premarin 0.625 Mg/gm Crea (Estrogens, conjugated) .... Use twice a week as directed 3)  Vitamin D3 1000 Unit Tabs (Cholecalciferol) .... Take 1 tablet by mouth once a day 4)  B Complex Tabs (B complex vitamins) 5)  Meloxicam 7.5 Mg Tabs (Meloxicam) .Marland Kitchen.. 1 by mouth two times a day  Hypertension Assessment/Plan:      The patient's hypertensive risk group is category B: At least one risk factor (excluding diabetes) with no target  organ damage.  Her calculated 10 year risk of coronary heart disease is 7 %.  Today's blood pressure is 120/80.  Her blood pressure goal is < 140/90.  Patient Instructions: 1)  exercise as tolerated.  to help.  2)  no change  in meds  3)  CPX with labs in about 6 months.   on or after July 27    Orders Added: 1)  Est. Patient Level III [99213]

## 2010-09-27 ENCOUNTER — Encounter: Payer: Self-pay | Admitting: Internal Medicine

## 2010-09-27 ENCOUNTER — Ambulatory Visit (INDEPENDENT_AMBULATORY_CARE_PROVIDER_SITE_OTHER): Payer: BC Managed Care – PPO | Admitting: Internal Medicine

## 2010-09-27 VITALS — BP 120/80 | HR 74 | Temp 99.0°F | Wt 162.0 lb

## 2010-09-27 DIAGNOSIS — J329 Chronic sinusitis, unspecified: Secondary | ICD-10-CM

## 2010-09-27 DIAGNOSIS — J069 Acute upper respiratory infection, unspecified: Secondary | ICD-10-CM

## 2010-09-27 NOTE — Progress Notes (Signed)
  Subjective:    Patient ID: Pamela [name], female    DOB: [birth year], 60 y.o.   MRN: [id number] Pt  Says has 3-4 days of above  .UR congestion  And then    Right nose bleed and blood clots  And feels bad  .  Yesterday had righ teeth pressure Took mucinex d and advil and cough drops.  NO fever but myalgias  And 99 temp .   Chest hurts middle and  Minimal cough.   More like throat clearing.  Throat hurts lower  On right. Right ear clogged at times.works in  [employer] school no specific exposures her family wanted her to be checked  HPI    Review of Systems  Constitutional: Positive for activity change (myalgias and fatigue ), appetite change and fatigue. Negative for diaphoresis.  HENT: Positive for nosebleeds, congestion, rhinorrhea and sinus pressure. Negative for hearing loss, facial swelling, trouble swallowing, neck pain and ear discharge.   Eyes: Negative for discharge.  Respiratory: Positive for cough. Negative for shortness of breath.   Cardiovascular: Negative.   Gastrointestinal: Negative.   Skin: Negative.   Hematological: Negative for adenopathy.   Repeat pulse wsa 74 and regular     Objective:   Physical Exam  Constitutional: She appears well-developed and well-nourished.  Non-toxic appearance. She does not have a sickly appearance. She appears ill. No distress.  HENT:  Head: Normocephalic and atraumatic.  Right Ear: Tympanic membrane normal.  Left Ear: Tympanic membrane normal.  Nose: Mucosal edema and rhinorrhea present. No sinus tenderness or nasal deformity. No epistaxis.  No foreign bodies. Right sinus exhibits no maxillary sinus tenderness and no frontal sinus tenderness. Left sinus exhibits no maxillary sinus tenderness and no frontal sinus tenderness.  Mouth/Throat: Uvula is midline, oropharynx is clear and moist and mucous membranes are normal. No oropharyngeal exudate.  Eyes: Conjunctivae are normal. Pupils are equal, round, and reactive to light.  Neck: Normal range of  motion. Neck supple.  Cardiovascular: Normal rate, regular rhythm and normal heart sounds.  Exam reveals no gallop and no friction rub.   No murmur heard. Pulmonary/Chest: Effort normal and breath sounds normal. She has no wheezes. She has no rales. She exhibits no tenderness.  Lymphadenopathy:    She has no cervical adenopathy.  Skin: Skin is warm and dry.          Assessment & Plan:  URi Sinusitis   prob viral      Expectant management. ,  Decongestants  And sample of ayr saline drop given  . See instr

## 2010-09-27 NOTE — Patient Instructions (Signed)
This is a viral URI  With sinus involvement.  This should resolve in 7-10 days . However the body aches should be gone in 2-3 days . Take decongestant and saline nose spray for  Moisturizing.     Nose spray  Would help nose bleeds.    Cough may get worse before gets better.  If getting increasing sinus pain despite above or continuing without improvement . Call for advise . Sometime antibiotic treatment will help at that point.

## 2010-09-30 ENCOUNTER — Encounter: Payer: Self-pay | Admitting: Internal Medicine

## 2011-01-13 ENCOUNTER — Other Ambulatory Visit: Payer: Self-pay | Admitting: Internal Medicine

## 2011-01-22 ENCOUNTER — Encounter: Payer: Self-pay | Admitting: Internal Medicine

## 2011-01-22 ENCOUNTER — Ambulatory Visit (INDEPENDENT_AMBULATORY_CARE_PROVIDER_SITE_OTHER): Payer: BC Managed Care – PPO | Admitting: Internal Medicine

## 2011-01-22 DIAGNOSIS — M25569 Pain in unspecified knee: Secondary | ICD-10-CM

## 2011-01-22 DIAGNOSIS — M1710 Unilateral primary osteoarthritis, unspecified knee: Secondary | ICD-10-CM

## 2011-01-22 NOTE — Progress Notes (Signed)
  Subjective:    Patient ID: Pamela [name], female    DOB: 02/23/1951, 60 y.o.   MRN: [id number]  HPI Patient comes in today for an acute appointment. She's been having more problems with bilateral knee pain onset after Zumba calss and went to her orthopedist.  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  Was told could do knee replacement when it got very severe.  What to do in the meantime. Comes here for my opinion.   Considering to go back to the water exercises.  NO high impact .    Water Yoga.   Hx of  Cortisone injections and not helping that much.    Pain is a deep pain sometimes it makes her feel sick usually frontal  knee and at joint line. Sometimes  Stiff   No redness .   Sis CPPD   Has autoimmune issue.  Also.  Other joints involved.    ? Hx of uric acid disease in family. Review of Systems Neg Gi  Other changes or skin changes  Past history family history social history reviewed in the electronic medical record.     Objective:   Physical Exam WDWN in nad  Knees : no redness or warmth but some crepitus and joint line pain. Mild enlargement  Gait ok .       Assessment & Plan:  Knee pain  Arthritis  Doesn t sound like CPPD or autoimmune disease  By hx and report of scan.  Discussed treatment options and the importance of good muscle conditioning truncal conditioning to help with knee pain and instability.  I recommend physical therapy to have her get into some exercises that would help her need pain and prevent progression hopefully. Eventually she converted to do these exercises at home. The meantime she can go with water exercises.

## 2011-01-22 NOTE — Patient Instructions (Addendum)
Will  Do a physical therapy referral  For known  Arthritis of knee. Water exercise is a good idea .

## 2011-02-11 ENCOUNTER — Ambulatory Visit: Payer: BC Managed Care – PPO | Attending: Internal Medicine

## 2011-02-11 DIAGNOSIS — IMO0001 Reserved for inherently not codable concepts without codable children: Secondary | ICD-10-CM | POA: Insufficient documentation

## 2011-02-11 DIAGNOSIS — R5381 Other malaise: Secondary | ICD-10-CM | POA: Insufficient documentation

## 2011-02-11 DIAGNOSIS — M25569 Pain in unspecified knee: Secondary | ICD-10-CM | POA: Insufficient documentation

## 2011-02-12 ENCOUNTER — Ambulatory Visit: Payer: BC Managed Care – PPO

## 2011-02-17 ENCOUNTER — Ambulatory Visit: Payer: BC Managed Care – PPO

## 2011-02-19 ENCOUNTER — Encounter: Payer: BC Managed Care – PPO | Admitting: Physical Therapy

## 2011-02-25 ENCOUNTER — Ambulatory Visit: Payer: BC Managed Care – PPO | Attending: Internal Medicine

## 2011-02-25 DIAGNOSIS — R5381 Other malaise: Secondary | ICD-10-CM | POA: Insufficient documentation

## 2011-02-25 DIAGNOSIS — IMO0001 Reserved for inherently not codable concepts without codable children: Secondary | ICD-10-CM | POA: Insufficient documentation

## 2011-02-25 DIAGNOSIS — M25569 Pain in unspecified knee: Secondary | ICD-10-CM | POA: Insufficient documentation

## 2011-02-27 ENCOUNTER — Encounter: Payer: BC Managed Care – PPO | Admitting: Physical Therapy

## 2011-03-06 ENCOUNTER — Ambulatory Visit: Payer: BC Managed Care – PPO

## 2011-03-11 ENCOUNTER — Other Ambulatory Visit: Payer: Self-pay | Admitting: Internal Medicine

## 2011-03-11 DIAGNOSIS — Z1231 Encounter for screening mammogram for malignant neoplasm of breast: Secondary | ICD-10-CM

## 2011-03-17 ENCOUNTER — Other Ambulatory Visit (INDEPENDENT_AMBULATORY_CARE_PROVIDER_SITE_OTHER): Payer: BC Managed Care – PPO

## 2011-03-17 DIAGNOSIS — Z0000 Encounter for general adult medical examination without abnormal findings: Secondary | ICD-10-CM

## 2011-03-17 LAB — CBC WITH DIFFERENTIAL/PLATELET
Basophils Relative: 0.6 % (ref 0.0–3.0)
Eosinophils Absolute: 0.1 10*3/uL (ref 0.0–0.7)
HCT: 37.7 % (ref 36.0–46.0)
Hemoglobin: 12.6 g/dL (ref 12.0–15.0)
Lymphocytes Relative: 40.2 % (ref 12.0–46.0)
Lymphs Abs: 1.8 10*3/uL (ref 0.7–4.0)
MCHC: 33.5 g/dL (ref 30.0–36.0)
MCV: 84.9 fl (ref 78.0–100.0)
Monocytes Absolute: 0.4 10*3/uL (ref 0.1–1.0)
Neutro Abs: 2.2 10*3/uL (ref 1.4–7.7)
RBC: 4.44 Mil/uL (ref 3.87–5.11)

## 2011-03-17 LAB — POCT URINALYSIS DIPSTICK
Ketones, UA: NEGATIVE
Protein, UA: NEGATIVE
Spec Grav, UA: 1.015

## 2011-03-17 LAB — LDL CHOLESTEROL, DIRECT: Direct LDL: 189 mg/dL

## 2011-03-17 LAB — TSH: TSH: 2.3 u[IU]/mL (ref 0.35–5.50)

## 2011-03-17 LAB — HEPATIC FUNCTION PANEL
Albumin: 4.4 g/dL (ref 3.5–5.2)
Alkaline Phosphatase: 54 U/L (ref 39–117)
Total Protein: 7 g/dL (ref 6.0–8.3)

## 2011-03-17 LAB — BASIC METABOLIC PANEL
CO2: 30 mEq/L (ref 19–32)
Chloride: 100 mEq/L (ref 96–112)
Sodium: 137 mEq/L (ref 135–145)

## 2011-03-17 LAB — LIPID PANEL: HDL: 56.5 mg/dL (ref >39.00)

## 2011-03-18 ENCOUNTER — Ambulatory Visit (HOSPITAL_COMMUNITY)
Admission: RE | Admit: 2011-03-18 | Discharge: 2011-03-18 | Disposition: A | Discharge status: Home / Self Care | Payer: BC Managed Care – PPO | Source: Ambulatory Visit | Attending: Internal Medicine | Admitting: Internal Medicine

## 2011-03-18 DIAGNOSIS — Z1231 Encounter for screening mammogram for malignant neoplasm of breast: Secondary | ICD-10-CM

## 2011-03-24 ENCOUNTER — Ambulatory Visit (INDEPENDENT_AMBULATORY_CARE_PROVIDER_SITE_OTHER): Payer: BC Managed Care – PPO | Admitting: Internal Medicine

## 2011-03-24 ENCOUNTER — Encounter: Payer: Self-pay | Admitting: Internal Medicine

## 2011-03-24 VITALS — BP 120/80 | HR 72 | Ht 64.5 in | Wt 165.0 lb

## 2011-03-24 DIAGNOSIS — K589 Irritable bowel syndrome without diarrhea: Secondary | ICD-10-CM

## 2011-03-24 DIAGNOSIS — F3289 Other specified depressive episodes: Secondary | ICD-10-CM

## 2011-03-24 DIAGNOSIS — E782 Mixed hyperlipidemia: Secondary | ICD-10-CM

## 2011-03-24 DIAGNOSIS — Z0000 Encounter for general adult medical examination without abnormal findings: Secondary | ICD-10-CM

## 2011-03-24 DIAGNOSIS — N952 Postmenopausal atrophic vaginitis: Secondary | ICD-10-CM

## 2011-03-24 DIAGNOSIS — F329 Major depressive disorder, single episode, unspecified: Secondary | ICD-10-CM

## 2011-03-24 MED ORDER — DULOXETINE HCL 60 MG PO CPEP: 60.0000 mg | ORAL_CAPSULE | Freq: Every day | ORAL | Status: DC

## 2011-03-24 NOTE — Patient Instructions (Signed)
Continue intensification  lifestyle intervention healthy eating and exercise . To get cholesterol level down.  Check up in a year with labs .  have pharmacy contact us for refills when needed in the meantime

## 2011-03-24 NOTE — Progress Notes (Signed)
  Subjective:    Patient ID: Pamela [name], female    DOB: 12-02-1950, 60 y.o.   MRN: [id number]  HPI Patient comes in today for preventive visit and follow-up of medical issues. Update of her history since her last visit.  Weight gain last year 15 #  prob related to going back to work.   Now back on track.  Planning not to work again.  Trying to go vegetarian soon and get the weight off again.  Review of Systems ROS:  GEN/ HEENTNo fever, significant weight changes sweats headaches vision problems hearing changes, CV/ PULM; No chest pain shortness of breath cough, syncope,edema  change in exercise tolerance. GI /GU: No adominal pain, vomiting, change in bowel habits. No blood in the stool. No significant GU symptoms. SKIN/HEME: ,no acute skin rashes suspicious lesions or bleeding. No lymphadenopathy, nodules, masses.  NEURO/ PSYCH:  No neurologic signs such as weakness numbness No depression anxiety. IMM/ Allergy: No unusual infections.  Allergy .   REST of 12 system review negative  Knee better afte physical therapy.    Past history family history social history reviewed in the electronic medical record.      Objective:   Physical Exam Physical Exam: Vital signs reviewed [id number] is a well-developed well-nourished alert cooperative  white female who appears her stated age in no acute distress.  HEENT: normocephalic  traumatic , Eyes: PERRL EOM's full, conjunctiva clear, Nares: paten,t no deformity discharge or tenderness., Ears: no deformity EAC's clear TMs with normal landmarks. Mouth: clear OP, no lesions, edema.  Moist mucous membranes. Dentition in adequate repair. NECK: supple without masses, thyromegaly or bruits. CHEST/PULM:  Clear to auscultation and percussion breath sounds equal no wheeze , rales or rhonchi. No chest wall deformities or tenderness. CV: PMI is nondisplaced, S1 S2 no gallops, murmurs, rubs. Peripheral pulses are full without delay.No JVD .  Breast: normal by  inspection . No dimpling, discharge, masses, tenderness or discharge . LN: no cervical axillary inguinal adenopathy  ABDOMEN: Bowel sounds normal nontender  No guard or rebound, no hepato splenomegal no CVA tenderness.  No hernia. Extremtities:  No clubbing cyanosis or edema, no acute joint swelling or redness no focal atrophy NEURO:  Oriented x3, cranial nerves 3-12 appear to be intact, no obvious focal weakness,gait within normal limits no abnormal reflexes or asymmetrical SKIN: No acute rashes normal turgor, color, no bruising or petechiae. PSYCH: Oriented, good eye contact, no obvious depression anxiety, cognition and judgment appear normal. Ext Gu nl    Labs reviewed with patient.     Assessment & Plan:  Preventive Health Care Counseled regarding healthy nutrition, exercise, sleep, injury prevention, calcium vit d and healthy weight .  Mood  Stable on cymbalta IBS stable alt constip but no pain LIPIDS:   Worse   Related to working  And weight gain   working again.   Will follow .

## 2011-06-02 ENCOUNTER — Other Ambulatory Visit: Payer: Self-pay | Admitting: Internal Medicine

## 2011-07-15 ENCOUNTER — Ambulatory Visit (INDEPENDENT_AMBULATORY_CARE_PROVIDER_SITE_OTHER): Payer: BC Managed Care – PPO | Admitting: Internal Medicine

## 2011-07-15 DIAGNOSIS — Z23 Encounter for immunization: Secondary | ICD-10-CM

## 2011-07-16 ENCOUNTER — Ambulatory Visit: Payer: BC Managed Care – PPO | Admitting: Internal Medicine

## 2012-01-15 ENCOUNTER — Encounter: Payer: Self-pay | Admitting: Gastroenterology

## 2012-03-17 ENCOUNTER — Other Ambulatory Visit: Payer: Self-pay | Admitting: Internal Medicine

## 2012-03-19 ENCOUNTER — Other Ambulatory Visit: Payer: Self-pay | Admitting: Family Medicine

## 2012-03-19 MED ORDER — DULOXETINE HCL 60 MG PO CPEP: 60.0000 mg | ORAL_CAPSULE | Freq: Every day | ORAL | Status: DC

## 2012-03-19 NOTE — Telephone Encounter (Signed)
Ok to refill til her visit.

## 2012-03-19 NOTE — Telephone Encounter (Signed)
Telephone note sent to [facility].  Waiting on response.

## 2012-03-19 NOTE — Telephone Encounter (Signed)
Telephone note sent to [facility].  Waiting on response. 

## 2012-03-19 NOTE — Telephone Encounter (Signed)
Sent to the pharmacy by e-scribe. 

## 2012-03-19 NOTE — Telephone Encounter (Signed)
Last seen 03-24-11 (CPE) and has a future appt on 06-15-12 (CPE).  Last filled 03-24-11 with one years worth of refills.  Please advise.  Thanks!!!

## 2012-04-02 ENCOUNTER — Other Ambulatory Visit: Payer: Self-pay | Admitting: Internal Medicine

## 2012-04-02 DIAGNOSIS — Z1231 Encounter for screening mammogram for malignant neoplasm of breast: Secondary | ICD-10-CM

## 2012-04-19 ENCOUNTER — Ambulatory Visit (HOSPITAL_COMMUNITY)
Admission: RE | Admit: 2012-04-19 | Discharge: 2012-04-19 | Disposition: A | Discharge status: Home / Self Care | Payer: BC Managed Care – PPO | Source: Ambulatory Visit | Attending: Internal Medicine | Admitting: Internal Medicine

## 2012-04-19 DIAGNOSIS — Z1231 Encounter for screening mammogram for malignant neoplasm of breast: Secondary | ICD-10-CM | POA: Insufficient documentation

## 2012-06-08 ENCOUNTER — Other Ambulatory Visit (INDEPENDENT_AMBULATORY_CARE_PROVIDER_SITE_OTHER): Payer: BC Managed Care – PPO

## 2012-06-08 DIAGNOSIS — Z0000 Encounter for general adult medical examination without abnormal findings: Secondary | ICD-10-CM

## 2012-06-08 LAB — CBC WITH DIFFERENTIAL/PLATELET
Basophils Absolute: 0 10*3/uL (ref 0.0–0.1)
Eosinophils Relative: 1.8 % (ref 0.0–5.0)
HCT: 37.9 % (ref 36.0–46.0)
Lymphocytes Relative: 40.1 % (ref 12.0–46.0)
Lymphs Abs: 2 10*3/uL (ref 0.7–4.0)
Monocytes Relative: 7.9 % (ref 3.0–12.0)
Neutrophils Relative %: 49.5 % (ref 43.0–77.0)
Platelets: 217 10*3/uL (ref 150.0–400.0)
RDW: 13.8 % (ref 11.5–14.6)
WBC: 4.9 10*3/uL (ref 4.5–10.5)

## 2012-06-08 LAB — HEPATIC FUNCTION PANEL
ALT: 17 U/L (ref 0–35)
AST: 20 U/L (ref 0–37)
Alkaline Phosphatase: 49 U/L (ref 39–117)
Bilirubin, Direct: 0.1 mg/dL (ref 0.0–0.3)
Total Bilirubin: 0.5 mg/dL (ref 0.3–1.2)

## 2012-06-08 LAB — LIPID PANEL
Cholesterol: 223 mg/dL — ABNORMAL HIGH (ref 0–200)
HDL: 55.3 mg/dL (ref >39.00)
Total CHOL/HDL Ratio: 4
VLDL: 23.8 mg/dL (ref 0.0–40.0)

## 2012-06-08 LAB — BASIC METABOLIC PANEL
BUN: 15 mg/dL (ref 6–23)
Calcium: 9 mg/dL (ref 8.4–10.5)
GFR: 71.77 mL/min (ref >60.00)
Potassium: 4.9 mEq/L (ref 3.5–5.1)

## 2012-06-08 LAB — POCT URINALYSIS DIPSTICK
Bilirubin, UA: NEGATIVE
Ketones, UA: NEGATIVE
Protein, UA: NEGATIVE
Spec Grav, UA: 1.015
pH, UA: 7.5

## 2012-06-15 ENCOUNTER — Ambulatory Visit (INDEPENDENT_AMBULATORY_CARE_PROVIDER_SITE_OTHER): Payer: BC Managed Care – PPO | Admitting: Internal Medicine

## 2012-06-15 ENCOUNTER — Encounter: Payer: Self-pay | Admitting: Internal Medicine

## 2012-06-15 VITALS — BP 122/72 | HR 72 | Temp 98.8°F | Ht 64.5 in | Wt 145.0 lb

## 2012-06-15 DIAGNOSIS — M25569 Pain in unspecified knee: Secondary | ICD-10-CM

## 2012-06-15 DIAGNOSIS — Z0000 Encounter for general adult medical examination without abnormal findings: Secondary | ICD-10-CM

## 2012-06-15 DIAGNOSIS — IMO0002 Reserved for concepts with insufficient information to code with codable children: Secondary | ICD-10-CM

## 2012-06-15 DIAGNOSIS — Z23 Encounter for immunization: Secondary | ICD-10-CM

## 2012-06-15 DIAGNOSIS — M1710 Unilateral primary osteoarthritis, unspecified knee: Secondary | ICD-10-CM

## 2012-06-15 DIAGNOSIS — M179 Osteoarthritis of knee, unspecified: Secondary | ICD-10-CM | POA: Insufficient documentation

## 2012-06-15 DIAGNOSIS — E782 Mixed hyperlipidemia: Secondary | ICD-10-CM

## 2012-06-15 DIAGNOSIS — N952 Postmenopausal atrophic vaginitis: Secondary | ICD-10-CM

## 2012-06-15 MED ORDER — ESTROGENS, CONJUGATED 0.625 MG/GM VA CREA: TOPICAL_CREAM | VAGINAL | Status: DC

## 2012-06-15 MED ORDER — DULOXETINE HCL 60 MG PO CPEP: 60.0000 mg | ORAL_CAPSULE | Freq: Every day | ORAL | Status: DC

## 2012-06-15 NOTE — Progress Notes (Signed)
Subjective:    Patient ID: Pamela [name], female    DOB: 06-May-1951, 61 y.o.   MRN: [id number]  HPI Patient comes in today for preventive visit and follow-up of medical issues. 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 Had episode of diarrhea losse stools  rx for bowel overgrowth and better  . Now seeing dr Loreta Ave.  Knee arthritis  Doing exercise and weight loss to delay TKR. Has lost weight with d and e    Hearing:  Ok   Vision:  No limitations at present . Glasses   Safety:  Has smoke detector and wears seat belts.  No firearms. No excess sun exposure. Sees dentist regularly.  Falls: no  Memory: Felt to be good  ,  Depression: No anhedonia unusual crying or depressive symptoms  Nutrition: Eats well balanced diet; adequate calcium and vitamin D. No swallowing chewiing problems.  Injury: no major injuries in the last six months.  Other healthcare providers:  Reviewed today .  Social:  Lives with husband married. Pet dog   Preventive parameters: up-to-date on colonoscopy, mammogram, immunizations. Including Tdap and pneumovax.  ADLS:   There are no problems or need for assistance  driving, feeding, obtaining food, dressing, toileting and bathing, managing money using phone. She is independent.  Exercise   Water exercise  Adult aerobics  And yoga to help with knees.   Review of Systems ROS:  GEN/ HEENT: No fever,  sweats headaches vision problems wears glasses  no hearing changes, CV/ PULM; No chest pain shortness of breath cough, syncope,edema  change in exercise tolerance. GI /GU: No adominal pain, vomiting, change in bowel habits. Except episode as above  No blood in the stool. No significant GU symptoms. SKIN/HEME: ,no acute skin rashes suspicious lesions or bleeding. No lymphadenopathy, nodules, masses.  NEURO/ PSYCH:  No neurologic signs such as weakness numbness. No depression anxiety.  At this time IMM/ Allergy: No unusual  infections.  Allergy .   REST of 12 system review negative except as per HPI  Past history family history social history reviewed in the electronic medical record.   Objective:   Physical Exam Wt Readings from Last 3 Encounters:  06/15/12 145 lb (65.772 kg)  03/24/11 165 lb (74.844 kg)  01/22/11 177 lb (80.287 kg)  BP 122/72  Pulse 72  Temp 98.8 F (37.1 C) (Oral)  Ht 5' 4.5" (1.638 m)  Wt 145 lb (65.772 kg)  BMI 24.50 kg/m2  SpO2 98%  Physical Exam: Vital signs reviewed [id number] is a well-developed well-nourished alert cooperative  white female who appears her stated age in no acute distress.  HEENT: normocephalic atraumatic , Eyes: PERRL EOM's full, conjunctiva clear, Nares: paten,t no deformity discharge or tenderness., Ears: no deformity EAC's clear TMs with normal landmarks. Mouth: clear OP, no lesions, edema.  Moist mucous membranes. Dentition in adequate repair. NECK: supple without masses, thyromegaly or bruits. CHEST/PULM:  Clear to auscultation and percussion breath sounds equal no wheeze , rales or rhonchi. No chest wall deformities or tenderness. CV: PMI is nondisplaced, S1 S2 no gallops, murmurs, rubs. Peripheral pulses are full without delay.No JVD .  No bruits heard  Breast: normal by inspection . No dimpling, discharge, masses, tenderness or discharge . ABDOMEN: Bowel sounds normal nontender  No guard or rebound, no hepato splenomegal no CVA tenderness.   Extremtities:  No clubbing cyanosis or edema, no acute joint swelling or redness no focal atrophy NEURO:  Oriented x3, cranial nerves 3-12 appear to be intact, no obvious focal weakness,gait within normal limits no abnormal reflexes or asymmetrical SKIN: No acute rashes normal turgor, color, no bruising or petechiae. PSYCH: Oriented, good eye contact, no obvious depression anxiety, cognition and judgment appear normal. LN: no cervical axillary  adenopathy   Lab Results  Component Value Date   WBC 4.9 06/08/2012     HGB 12.4 06/08/2012   HCT 37.9 06/08/2012   PLT 217.0 06/08/2012   GLUCOSE 85 06/08/2012   CHOL 223* 06/08/2012   TRIG 119.0 06/08/2012   HDL 55.30 06/08/2012   LDLDIRECT 155.3 06/08/2012   LDLCALC 93 05/10/2009   ALT 17 06/08/2012   AST 20 06/08/2012   NA 141 06/08/2012   K 4.9 06/08/2012   CL 105 06/08/2012   CREATININE 0.9 06/08/2012   BUN 15 06/08/2012   CO2 30 06/08/2012   TSH 2.01 06/08/2012      Assessment & Plan:  Preventive Health Care Counseled regarding healthy nutrition, exercise, sleep, injury prevention, calcium vit d and healthy weight . Doing great with weight loss and  Numbers better  Flu vaccine today  LIPIDS much better with lsi and weight loss  Continue  Hx of c diff and then bowel overgrowth  Syndrome rx per Dr Loreta Ave doing well IBS taken off of the problem list.  Mood and DJD  Doing well on cymbalta continue  Has been on meds in past and would not go off at this time  Consider in future  Atrophic  vaginitis continue topical premarin  CPX in 1 year as doing well  Or as needed

## 2012-06-15 NOTE — Patient Instructions (Addendum)
Continue lifestyle intervention healthy eating and exercise . Lipids are better.  Continue same meds.  Preventive Care for Adults, Female A healthy lifestyle and preventive care can promote health and wellness. Preventive health guidelines for women include the following key practices.  A routine yearly physical is a good way to check with your caregiver about your health and preventive screening. It is a chance to share any concerns and updates on your health, and to receive a thorough exam.  Visit your dentist for a routine exam and preventive care every 6 months. Brush your teeth twice a day and floss once a day. Good oral hygiene prevents tooth decay and gum disease.  The frequency of eye exams is based on your age, health, family medical history, use of contact lenses, and other factors. Follow your caregiver's recommendations for frequency of eye exams.  Eat a healthy diet. Foods like vegetables, fruits, whole grains, low-fat dairy products, and lean protein foods contain the nutrients you need without too many calories. Decrease your intake of foods high in solid fats, added sugars, and salt. Eat the right amount of calories for you.Get information about a proper diet from your caregiver, if necessary.  Regular physical exercise is one of the most important things you can do for your health. Most adults should get at least 150 minutes of moderate-intensity exercise (any activity that increases your heart rate and causes you to sweat) each week. In addition, most adults need muscle-strengthening exercises on 2 or more days a week.  Maintain a healthy weight. The body mass index (BMI) is a screening tool to identify possible weight problems. It provides an estimate of body fat based on height and weight. Your caregiver can help determine your BMI, and can help you achieve or maintain a healthy weight.For adults 20 years and older:  A BMI below 18.5 is considered underweight.  A BMI of  18.5 to 24.9 is normal.  A BMI of 25 to 29.9 is considered overweight.  A BMI of 30 and above is considered obese.  Maintain normal blood lipids and cholesterol levels by exercising and minimizing your intake of saturated fat. Eat a balanced diet with plenty of fruit and vegetables. Blood tests for lipids and cholesterol should begin at age 52 and be repeated every 5 years. If your lipid or cholesterol levels are high, you are over 50, or you are at high risk for heart disease, you may need your cholesterol levels checked more frequently.Ongoing high lipid and cholesterol levels should be treated with medicines if diet and exercise are not effective.  If you smoke, find out from your caregiver how to quit. If you do not use tobacco, do not start.  If you are pregnant, do not drink alcohol. If you are breastfeeding, be very cautious about drinking alcohol. If you are not pregnant and choose to drink alcohol, do not exceed 1 drink per day. One drink is considered to be 12 ounces (355 mL) of beer, 5 ounces (148 mL) of wine, or 1.5 ounces (44 mL) of liquor.  Avoid use of street drugs. Do not share needles with anyone. Ask for help if you need support or instructions about stopping the use of drugs.  High blood pressure causes heart disease and increases the risk of stroke. Your blood pressure should be checked at least every 1 to 2 years. Ongoing high blood pressure should be treated with medicines if weight loss and exercise are not effective.  If you are 55 to  63 years old, ask your caregiver if you should take aspirin to prevent strokes.  Diabetes screening involves taking a blood sample to check your fasting blood sugar level. This should be done once every 3 years, after age 29, if you are within normal weight and without risk factors for diabetes. Testing should be considered at a younger age or be carried out more frequently if you are overweight and have at least 1 risk factor for  diabetes.  Breast cancer screening is essential preventive care for women. You should practice "breast self-awareness." This means understanding the normal appearance and feel of your breasts and may include breast self-examination. Any changes detected, no matter how small, should be reported to a caregiver. Women in their 43s and 30s should have a clinical breast exam (CBE) by a caregiver as part of a regular health exam every 1 to 3 years. After age 43, women should have a CBE every year. Starting at age 68, women should consider having a mammography (breast X-ray test) every year. Women who have a family history of breast cancer should talk to their caregiver about genetic screening. Women at a high risk of breast cancer should talk to their caregivers about having magnetic resonance imaging (MRI) and a mammography every year.  The Pap test is a screening test for cervical cancer. A Pap test can show cell changes on the cervix that might become cervical cancer if left untreated. A Pap test is a procedure in which cells are obtained and examined from the lower end of the uterus (cervix).  Women should have a Pap test starting at age 38.  Between ages 16 and 69, Pap tests should be repeated every 2 years.  Beginning at age 3, you should have a Pap test every 3 years as long as the past 3 Pap tests have been normal.  Some women have medical problems that increase the chance of getting cervical cancer. Talk to your caregiver about these problems. It is especially important to talk to your caregiver if a new problem develops soon after your last Pap test. In these cases, your caregiver may recommend more frequent screening and Pap tests.  The above recommendations are the same for women who have or have not gotten the vaccine for human papillomavirus (HPV).  If you had a hysterectomy for a problem that was not cancer or a condition that could lead to cancer, then you no longer need Pap tests. Even if  you no longer need a Pap test, a regular exam is a good idea to make sure no other problems are starting.  If you are between ages 1 and 65, and you have had normal Pap tests going back 10 years, you no longer need Pap tests. Even if you no longer need a Pap test, a regular exam is a good idea to make sure no other problems are starting.  If you have had past treatment for cervical cancer or a condition that could lead to cancer, you need Pap tests and screening for cancer for at least 20 years after your treatment.  If Pap tests have been discontinued, risk factors (such as a new sexual partner) need to be reassessed to determine if screening should be resumed.  The HPV test is an additional test that may be used for cervical cancer screening. The HPV test looks for the virus that can cause the cell changes on the cervix. The cells collected during the Pap test can be tested for HPV.  The HPV test could be used to screen women aged 74 years and older, and should be used in women of any age who have unclear Pap test results. After the age of 92, women should have HPV testing at the same frequency as a Pap test.  Colorectal cancer can be detected and often prevented. Most routine colorectal cancer screening begins at the age of 45 and continues through age 62. However, your caregiver may recommend screening at an earlier age if you have risk factors for colon cancer. On a yearly basis, your caregiver may provide home test kits to check for hidden blood in the stool. Use of a small camera at the end of a tube, to directly examine the colon (sigmoidoscopy or colonoscopy), can detect the earliest forms of colorectal cancer. Talk to your caregiver about this at age 48, when routine screening begins. Direct examination of the colon should be repeated every 5 to 10 years through age 31, unless early forms of pre-cancerous polyps or small growths are found.  Hepatitis C blood testing is recommended for all  people born from [birth year] through 1965 and any individual with known risks for hepatitis C.  Practice safe sex. Use condoms and avoid high-risk sexual practices to reduce the spread of sexually transmitted infections (STIs). STIs include gonorrhea, chlamydia, syphilis, trichomonas, herpes, HPV, and human immunodeficiency virus (HIV). Herpes, HIV, and HPV are viral illnesses that have no cure. They can result in disability, cancer, and death. Sexually active women aged 83 and younger should be checked for chlamydia. Older women with new or multiple partners should also be tested for chlamydia. Testing for other STIs is recommended if you are sexually active and at increased risk.  Osteoporosis is a disease in which the bones lose minerals and strength with aging. This can result in serious bone fractures. The risk of osteoporosis can be identified using a bone density scan. Women ages 3 and over and women at risk for fractures or osteoporosis should discuss screening with their caregivers. Ask your caregiver whether you should take a calcium supplement or vitamin D to reduce the rate of osteoporosis.  Menopause can be associated with physical symptoms and risks. Hormone replacement therapy is available to decrease symptoms and risks. You should talk to your caregiver about whether hormone replacement therapy is right for you.  Use sunscreen with sun protection factor (SPF) of 30 or more. Apply sunscreen liberally and repeatedly throughout the day. You should seek shade when your shadow is shorter than you. Protect yourself by wearing long sleeves, pants, a wide-brimmed hat, and sunglasses year round, whenever you are outdoors.  Once a month, do a whole body skin exam, using a mirror to look at the skin on your back. Notify your caregiver of new moles, moles that have irregular borders, moles that are larger than a pencil eraser, or moles that have changed in shape or color.  Stay current with required  immunizations.  Influenza. You need a dose every fall (or winter). The composition of the flu vaccine changes each year, so being vaccinated once is not enough.  Pneumococcal polysaccharide. You need 1 to 2 doses if you smoke cigarettes or if you have certain chronic medical conditions. You need 1 dose at age 79 (or older) if you have never been vaccinated.  Tetanus, diphtheria, pertussis (Tdap, Td). Get 1 dose of Tdap vaccine if you are younger than age 2, are over 87 and have contact with an infant, are a Research scientist (physical sciences), are  pregnant, or simply want to be protected from whooping cough. After that, you need a Td booster dose every 10 years. Consult your caregiver if you have not had at least 3 tetanus and diphtheria-containing shots sometime in your life or have a deep or dirty wound.  HPV. You need this vaccine if you are a woman age 20 or younger. The vaccine is given in 3 doses over 6 months.  Measles, mumps, rubella (MMR). You need at least 1 dose of MMR if you were born in 1957 or later. You may also need a second dose.  Meningococcal. If you are age 80 to 7 and a first-year college student living in a residence hall, or have one of several medical conditions, you need to get vaccinated against meningococcal disease. You may also need additional booster doses.  Zoster (shingles). If you are age 23 or older, you should get this vaccine.  Varicella (chickenpox). If you have never had chickenpox or you were vaccinated but received only 1 dose, talk to your caregiver to find out if you need this vaccine.  Hepatitis A. You need this vaccine if you have a specific risk factor for hepatitis A virus infection or you simply wish to be protected from this disease. The vaccine is usually given as 2 doses, 6 to 18 months apart.  Hepatitis B. You need this vaccine if you have a specific risk factor for hepatitis B virus infection or you simply wish to be protected from this disease. The vaccine is  given in 3 doses, usually over 6 months. Preventive Services / Frequency Ages 32 to 79  Blood pressure check.** / Every 1 to 2 years.  Lipid and cholesterol check.** / Every 5 years beginning at age 22.  Clinical breast exam.** / Every 3 years for women in their 68s and 30s.  Pap test.** / Every 2 years from ages 60 through 38. Every 3 years starting at age 38 through age 48 or 91 with a history of 3 consecutive normal Pap tests.  HPV screening.** / Every 3 years from ages 54 through ages 35 to 58 with a history of 3 consecutive normal Pap tests.  Hepatitis C blood test.** / For any individual with known risks for hepatitis C.  Skin self-exam. / Monthly.  Influenza immunization.** / Every year.  Pneumococcal polysaccharide immunization.** / 1 to 2 doses if you smoke cigarettes or if you have certain chronic medical conditions.  Tetanus, diphtheria, pertussis (Tdap, Td) immunization. / A one-time dose of Tdap vaccine. After that, you need a Td booster dose every 10 years.  HPV immunization. / 3 doses over 6 months, if you are 50 and younger.  Measles, mumps, rubella (MMR) immunization. / You need at least 1 dose of MMR if you were born in 1957 or later. You may also need a second dose.  Meningococcal immunization. / 1 dose if you are age 39 to 30 and a first-year college student living in a residence hall, or have one of several medical conditions, you need to get vaccinated against meningococcal disease. You may also need additional booster doses.  Varicella immunization.** / Consult your caregiver.  Hepatitis A immunization.** / Consult your caregiver. 2 doses, 6 to 18 months apart.  Hepatitis B immunization.** / Consult your caregiver. 3 doses usually over 6 months. Ages 2 to 80  Blood pressure check.** / Every 1 to 2 years.  Lipid and cholesterol check.** / Every 5 years beginning at age 41.  Clinical breast  exam.** / Every year after age 47.  Mammogram.** / Every year  beginning at age 74 and continuing for as long as you are in good health. Consult with your caregiver.  Pap test.** / Every 3 years starting at age 56 through age 24 or 32 with a history of 3 consecutive normal Pap tests.  HPV screening.** / Every 3 years from ages 41 through ages 68 to 60 with a history of 3 consecutive normal Pap tests.  Fecal occult blood test (FOBT) of stool. / Every year beginning at age 22 and continuing until age 57. You may not need to do this test if you get a colonoscopy every 10 years.  Flexible sigmoidoscopy or colonoscopy.** / Every 5 years for a flexible sigmoidoscopy or every 10 years for a colonoscopy beginning at age 50 and continuing until age 25.  Hepatitis C blood test.** / For all people born from [birth year] through 1965 and any individual with known risks for hepatitis C.  Skin self-exam. / Monthly.  Influenza immunization.** / Every year.  Pneumococcal polysaccharide immunization.** / 1 to 2 doses if you smoke cigarettes or if you have certain chronic medical conditions.  Tetanus, diphtheria, pertussis (Tdap, Td) immunization.** / A one-time dose of Tdap vaccine. After that, you need a Td booster dose every 10 years.  Measles, mumps, rubella (MMR) immunization. / You need at least 1 dose of MMR if you were born in 1957 or later. You may also need a second dose.  Varicella immunization.** / Consult your caregiver.  Meningococcal immunization.** / Consult your caregiver.  Hepatitis A immunization.** / Consult your caregiver. 2 doses, 6 to 18 months apart.  Hepatitis B immunization.** / Consult your caregiver. 3 doses, usually over 6 months. Ages 65 and over  Blood pressure check.** / Every 1 to 2 years.  Lipid and cholesterol check.** / Every 5 years beginning at age 70.  Clinical breast exam.** / Every year after age 26.  Mammogram.** / Every year beginning at age 68 and continuing for as long as you are in good health. Consult with your  caregiver.  Pap test.** / Every 3 years starting at age 10 through age 84 or 22 with a 3 consecutive normal Pap tests. Testing can be stopped between 65 and 70 with 3 consecutive normal Pap tests and no abnormal Pap or HPV tests in the past 10 years.  HPV screening.** / Every 3 years from ages 44 through ages 40 or 9 with a history of 3 consecutive normal Pap tests. Testing can be stopped between 65 and 70 with 3 consecutive normal Pap tests and no abnormal Pap or HPV tests in the past 10 years.  Fecal occult blood test (FOBT) of stool. / Every year beginning at age 42 and continuing until age 15. You may not need to do this test if you get a colonoscopy every 10 years.  Flexible sigmoidoscopy or colonoscopy.** / Every 5 years for a flexible sigmoidoscopy or every 10 years for a colonoscopy beginning at age 20 and continuing until age 65.  Hepatitis C blood test.** / For all people born from [birth year] through 1965 and any individual with known risks for hepatitis C.  Osteoporosis screening.** / A one-time screening for women ages 40 and over and women at risk for fractures or osteoporosis.  Skin self-exam. / Monthly.  Influenza immunization.** / Every year.  Pneumococcal polysaccharide immunization.** / 1 dose at age 64 (or older) if you have never been vaccinated.  Tetanus,  diphtheria, pertussis (Tdap, Td) immunization. / A one-time dose of Tdap vaccine if you are over 65 and have contact with an infant, are a Research scientist (physical sciences), or simply want to be protected from whooping cough. After that, you need a Td booster dose every 10 years.  Varicella immunization.** / Consult your caregiver.  Meningococcal immunization.** / Consult your caregiver.  Hepatitis A immunization.** / Consult your caregiver. 2 doses, 6 to 18 months apart.  Hepatitis B immunization.** / Check with your caregiver. 3 doses, usually over 6 months. ** Family history and personal history of risk and conditions may change your  caregiver's recommendations. Document Released: 10/07/2001 Document Revised: 11/03/2011 Document Reviewed: 01/06/2011 [facility] Patient Information 2013 [place], [place].

## 2012-06-15 NOTE — Assessment & Plan Note (Signed)
Continue lsi exercise

## 2012-07-20 ENCOUNTER — Other Ambulatory Visit: Payer: Self-pay | Admitting: Orthopedic Surgery

## 2012-08-03 ENCOUNTER — Encounter (HOSPITAL_BASED_OUTPATIENT_CLINIC_OR_DEPARTMENT_OTHER): Payer: Self-pay | Admitting: *Deleted

## 2012-08-03 NOTE — Progress Notes (Signed)
NPO AFTER MN. ARRIVES AT 1030. NEEDS HG. WILL TAKE CYMBALTA AM OF SURG W/ SIP OF WATER.

## 2012-08-06 ENCOUNTER — Encounter (HOSPITAL_BASED_OUTPATIENT_CLINIC_OR_DEPARTMENT_OTHER): Payer: Self-pay

## 2012-08-06 ENCOUNTER — Encounter (HOSPITAL_BASED_OUTPATIENT_CLINIC_OR_DEPARTMENT_OTHER): Payer: Self-pay | Admitting: Anesthesiology

## 2012-08-06 ENCOUNTER — Ambulatory Visit (HOSPITAL_BASED_OUTPATIENT_CLINIC_OR_DEPARTMENT_OTHER)
Admission: RE | Admit: 2012-08-06 | Discharge: 2012-08-06 | Disposition: A | Discharge status: Home / Self Care | Payer: BC Managed Care – PPO | Source: Ambulatory Visit | Attending: Orthopedic Surgery | Admitting: Orthopedic Surgery

## 2012-08-06 ENCOUNTER — Encounter (HOSPITAL_BASED_OUTPATIENT_CLINIC_OR_DEPARTMENT_OTHER)
Admission: RE | Disposition: A | Discharge status: Home / Self Care | Payer: Self-pay | Source: Ambulatory Visit | Attending: Orthopedic Surgery

## 2012-08-06 ENCOUNTER — Ambulatory Visit (HOSPITAL_BASED_OUTPATIENT_CLINIC_OR_DEPARTMENT_OTHER): Payer: BC Managed Care – PPO | Admitting: Anesthesiology

## 2012-08-06 DIAGNOSIS — M24119 Other articular cartilage disorders, unspecified shoulder: Secondary | ICD-10-CM | POA: Insufficient documentation

## 2012-08-06 DIAGNOSIS — S43429A Sprain of unspecified rotator cuff capsule, initial encounter: Secondary | ICD-10-CM | POA: Insufficient documentation

## 2012-08-06 DIAGNOSIS — Z79899 Other long term (current) drug therapy: Secondary | ICD-10-CM | POA: Insufficient documentation

## 2012-08-06 DIAGNOSIS — X58XXXA Exposure to other specified factors, initial encounter: Secondary | ICD-10-CM | POA: Insufficient documentation

## 2012-08-06 DIAGNOSIS — S43499A Other sprain of unspecified shoulder joint, initial encounter: Secondary | ICD-10-CM | POA: Insufficient documentation

## 2012-08-06 DIAGNOSIS — I10 Essential (primary) hypertension: Secondary | ICD-10-CM | POA: Insufficient documentation

## 2012-08-06 DIAGNOSIS — M129 Arthropathy, unspecified: Secondary | ICD-10-CM | POA: Insufficient documentation

## 2012-08-06 DIAGNOSIS — Z9889 Other specified postprocedural states: Secondary | ICD-10-CM

## 2012-08-06 DIAGNOSIS — M25819 Other specified joint disorders, unspecified shoulder: Secondary | ICD-10-CM | POA: Insufficient documentation

## 2012-08-06 DIAGNOSIS — Z90710 Acquired absence of both cervix and uterus: Secondary | ICD-10-CM | POA: Insufficient documentation

## 2012-08-06 DIAGNOSIS — E782 Mixed hyperlipidemia: Secondary | ICD-10-CM | POA: Insufficient documentation

## 2012-08-06 HISTORY — DX: Impingement syndrome of right shoulder: M75.41

## 2012-08-06 HISTORY — PX: SHOULDER ARTHROSCOPY WITH SUBACROMIAL DECOMPRESSION: SHX5684

## 2012-08-06 HISTORY — PX: SHOULDER ARTHROSCOPY WITH ROTATOR CUFF REPAIR: SHX5685

## 2012-08-06 HISTORY — DX: Unspecified osteoarthritis, unspecified site: M19.90

## 2012-08-06 HISTORY — DX: Mixed hyperlipidemia: E78.2

## 2012-08-06 LAB — POCT HEMOGLOBIN-HEMACUE: Hemoglobin: 13.2 g/dL (ref 12.0–15.0)

## 2012-08-06 SURGERY — SHOULDER ARTHROSCOPY WITH SUBACROMIAL DECOMPRESSION
Anesthesia: General | Site: Shoulder | Laterality: Right | Wound class: Clean

## 2012-08-06 MED ORDER — DEXAMETHASONE SODIUM PHOSPHATE 4 MG/ML IJ SOLN
INTRAMUSCULAR | Status: DC | PRN
  Administered 2012-08-06: 10 mg via INTRAVENOUS

## 2012-08-06 MED ORDER — ROPIVACAINE HCL 5 MG/ML IJ SOLN
INTRAMUSCULAR | Status: DC | PRN
  Administered 2012-08-06: 25 mL

## 2012-08-06 MED ORDER — FENTANYL CITRATE 0.05 MG/ML IJ SOLN
100.0000 ug | Freq: Once | INTRAMUSCULAR | Status: AC
  Administered 2012-08-06: 100 ug via INTRAVENOUS

## 2012-08-06 MED ORDER — SODIUM CHLORIDE 0.9 % IR SOLN
Status: DC | PRN
  Administered 2012-08-06: 14:00:00

## 2012-08-06 MED ORDER — POVIDONE-IODINE 7.5 % EX SOLN: Freq: Once | CUTANEOUS | Status: DC

## 2012-08-06 MED ORDER — LACTATED RINGERS IV SOLN
INTRAVENOUS | Status: DC
  Administered 2012-08-06 (×3): via INTRAVENOUS

## 2012-08-06 MED ORDER — CEFAZOLIN SODIUM-DEXTROSE 2-3 GM-% IV SOLR
2.0000 g | INTRAVENOUS | Status: AC
  Administered 2012-08-06: 2 g via INTRAVENOUS

## 2012-08-06 MED ORDER — PROPOFOL 10 MG/ML IV BOLUS
INTRAVENOUS | Status: DC | PRN
  Administered 2012-08-06: 180 mg via INTRAVENOUS

## 2012-08-06 MED ORDER — LIDOCAINE HCL (CARDIAC) 20 MG/ML IV SOLN
INTRAVENOUS | Status: DC | PRN
  Administered 2012-08-06: 50 mg via INTRAVENOUS

## 2012-08-06 MED ORDER — HYDROMORPHONE HCL PF 1 MG/ML IJ SOLN
0.2500 mg | INTRAMUSCULAR | Status: DC | PRN
  Filled 2012-08-06: qty 1

## 2012-08-06 MED ORDER — OXYCODONE-ACETAMINOPHEN 7.5-325 MG PO TABS: 1.0000 | ORAL_TABLET | ORAL | Status: DC | PRN

## 2012-08-06 MED ORDER — LACTATED RINGERS IV BOLUS (SEPSIS)
500.0000 mL | Freq: Once | INTRAVENOUS | Status: DC
  Filled 2012-08-06: qty 500

## 2012-08-06 MED ORDER — SUCCINYLCHOLINE CHLORIDE 20 MG/ML IJ SOLN
INTRAMUSCULAR | Status: DC | PRN
  Administered 2012-08-06: 170 mg via INTRAVENOUS

## 2012-08-06 MED ORDER — KETOROLAC TROMETHAMINE 30 MG/ML IJ SOLN
15.0000 mg | Freq: Once | INTRAMUSCULAR | Status: DC | PRN
  Filled 2012-08-06: qty 1

## 2012-08-06 MED ORDER — PHENYLEPHRINE HCL 10 MG/ML IJ SOLN
10.0000 mg | INTRAVENOUS | Status: DC | PRN
  Administered 2012-08-06: 25 ug/min via INTRAVENOUS

## 2012-08-06 MED ORDER — EPHEDRINE SULFATE 50 MG/ML IJ SOLN
INTRAMUSCULAR | Status: DC | PRN
  Administered 2012-08-06 (×3): 25 mg via INTRAVENOUS

## 2012-08-06 MED ORDER — ONDANSETRON HCL 4 MG/2ML IJ SOLN
INTRAMUSCULAR | Status: DC | PRN
  Administered 2012-08-06: 4 mg via INTRAVENOUS

## 2012-08-06 MED ORDER — PROMETHAZINE HCL 25 MG/ML IJ SOLN
6.2500 mg | INTRAMUSCULAR | Status: DC | PRN
  Filled 2012-08-06: qty 1

## 2012-08-06 MED ORDER — MIDAZOLAM HCL 2 MG/2ML IJ SOLN
2.0000 mg | Freq: Once | INTRAMUSCULAR | Status: AC
  Administered 2012-08-06: 2 mg via INTRAVENOUS

## 2012-08-06 MED ORDER — METHOCARBAMOL 500 MG PO TABS: 500.0000 mg | ORAL_TABLET | Freq: Four times a day (QID) | ORAL | Status: DC

## 2012-08-06 MED ORDER — FENTANYL CITRATE 0.05 MG/ML IJ SOLN
INTRAMUSCULAR | Status: DC | PRN
  Administered 2012-08-06: 100 ug via INTRAVENOUS

## 2012-08-06 MED ORDER — BUPIVACAINE-EPINEPHRINE 0.5% -1:200000 IJ SOLN
INTRAMUSCULAR | Status: DC | PRN
  Administered 2012-08-06: 5 mL

## 2012-08-06 SURGICAL SUPPLY — 75 items
APL SKNCLS STERI-STRIP NONHPOA (GAUZE/BANDAGES/DRESSINGS)
BENZOIN TINCTURE PRP APPL 2/3 (GAUZE/BANDAGES/DRESSINGS) ×1 IMPLANT
BLADE 4.2CUDA (BLADE) ×2 IMPLANT
BLADE CUDA 4.2 (BLADE) IMPLANT
BLADE CUDA 5.5 (BLADE) IMPLANT
BLADE CUDA SHAVER 3.5 (BLADE) IMPLANT
BLADE CUTTER GATOR 3.5 (BLADE) IMPLANT
BLADE FLAT COURSE (BLADE) IMPLANT
BLADE GREAT WHITE 4.2 (BLADE) IMPLANT
BLADE SURG 10 STRL SS (BLADE) IMPLANT
BLADE SURG 15 STRL LF DISP TIS (BLADE) IMPLANT
BLADE SURG 15 STRL SS (BLADE)
BUR OVAL 4.0 (BURR) ×2 IMPLANT
CANISTER SUCT LVC 12 LTR MEDI- (MISCELLANEOUS) ×3 IMPLANT
CANISTER SUCTION 1200CC (MISCELLANEOUS) ×1 IMPLANT
CANISTER SUCTION 2500CC (MISCELLANEOUS) IMPLANT
CLOTH BEACON ORANGE TIMEOUT ST (SAFETY) ×2 IMPLANT
DRAPE LG THREE QUARTER DISP (DRAPES) ×4 IMPLANT
DRAPE SHOULDER BEACH CHAIR (DRAPES) ×2 IMPLANT
DRAPE U-SHAPE 47X51 STRL (DRAPES) ×2 IMPLANT
DRSG ADAPTIC 3X8 NADH LF (GAUZE/BANDAGES/DRESSINGS) ×2 IMPLANT
DRSG PAD ABDOMINAL 8X10 ST (GAUZE/BANDAGES/DRESSINGS) ×2 IMPLANT
DURAPREP 26ML APPLICATOR (WOUND CARE) ×3 IMPLANT
ELECT MENISCUS 165MM 90D (ELECTRODE) IMPLANT
ELECT REM PT RETURN 9FT ADLT (ELECTROSURGICAL) ×2
ELECTRODE REM PT RTRN 9FT ADLT (ELECTROSURGICAL) ×1 IMPLANT
GLOVE BIO SURGEON STRL SZ 6.5 (GLOVE) ×1 IMPLANT
GLOVE BIO SURGEON STRL SZ7 (GLOVE) ×1 IMPLANT
GLOVE BIO SURGEON STRL SZ7.5 (GLOVE) ×1 IMPLANT
GLOVE BIOGEL PI IND STRL 8 (GLOVE) ×1 IMPLANT
GLOVE BIOGEL PI INDICATOR 8 (GLOVE) ×1
GLOVE ECLIPSE 8.0 STRL XLNG CF (GLOVE) ×3 IMPLANT
GLOVE INDICATOR 8.0 STRL GRN (GLOVE) ×2 IMPLANT
GLOVE SS BIOGEL STRL SZ 6.5 (GLOVE) IMPLANT
GLOVE SUPERSENSE BIOGEL SZ 6.5 (GLOVE) ×1
GOWN PREVENTION PLUS LG XLONG (DISPOSABLE) ×4 IMPLANT
GOWN STRL REIN XL XLG (GOWN DISPOSABLE) ×3 IMPLANT
IV NS IRRIG 3000ML ARTHROMATIC (IV SOLUTION) ×6 IMPLANT
NDL 1/2 CIR CATGUT .05X1.09 (NEEDLE) IMPLANT
NDL HYPO 18GX1.5 BLUNT FILL (NEEDLE) ×1 IMPLANT
NDL SAFETY ECLIPSE 18X1.5 (NEEDLE) ×1 IMPLANT
NEEDLE 1/2 CIR CATGUT .05X1.09 (NEEDLE) IMPLANT
NEEDLE HYPO 18GX1.5 BLUNT FILL (NEEDLE) ×2 IMPLANT
NEEDLE HYPO 18GX1.5 SHARP (NEEDLE) ×2
NEEDLE HYPO 22GX1.5 SAFETY (NEEDLE) ×2 IMPLANT
NS IRRIG 500ML POUR BTL (IV SOLUTION) ×1 IMPLANT
PACK ARTHROSCOPY DSU (CUSTOM PROCEDURE TRAY) ×2 IMPLANT
PACK BASIN DAY SURGERY FS (CUSTOM PROCEDURE TRAY) ×2 IMPLANT
PENCIL BUTTON HOLSTER BLD 10FT (ELECTRODE) IMPLANT
SET ARTHROSCOPY TUBING (MISCELLANEOUS) ×2
SET ARTHROSCOPY TUBING LN (MISCELLANEOUS) ×1 IMPLANT
SLING ARM IMMOBILIZER LRG (SOFTGOODS) ×1 IMPLANT
SPONGE GAUZE 4X4 12PLY (GAUZE/BANDAGES/DRESSINGS) ×2 IMPLANT
SPONGE SURGIFOAM ABS GEL 100 (HEMOSTASIS) IMPLANT
STAPLER VISISTAT 35W (STAPLE) IMPLANT
STRIP CLOSURE SKIN 1/2X4 (GAUZE/BANDAGES/DRESSINGS) ×2 IMPLANT
SUCTION FRAZIER TIP 10 FR DISP (SUCTIONS) ×2 IMPLANT
SUT BONE WAX W31G (SUTURE) IMPLANT
SUT ETHIBOND GREEN BRAID 0S 4 (SUTURE) IMPLANT
SUT ETHIBOND NAB CT1 #1 30IN (SUTURE) IMPLANT
SUT ETHILON 4 0 PS 2 18 (SUTURE) ×4 IMPLANT
SUT VIC AB 0 CT1 36 (SUTURE) IMPLANT
SUT VIC AB 1 CT1 36 (SUTURE) ×4 IMPLANT
SUT VIC AB 2-0 CT1 27 (SUTURE) ×4
SUT VIC AB 2-0 CT1 TAPERPNT 27 (SUTURE) ×2 IMPLANT
SUT VIC AB 3-0 SH 27 (SUTURE)
SUT VIC AB 3-0 SH 27X BRD (SUTURE) IMPLANT
SUT VICRYL 4-0 PS2 18IN ABS (SUTURE) IMPLANT
SYR BULB IRRIGATION 50ML (SYRINGE) ×2 IMPLANT
SYRINGE 10CC LL (SYRINGE) ×2 IMPLANT
TAPE HYPAFIX 6X30 (GAUZE/BANDAGES/DRESSINGS) ×1 IMPLANT
TOWEL OR 17X24 6PK STRL BLUE (TOWEL DISPOSABLE) ×2 IMPLANT
TUBE CONNECTING 12X1/4 (SUCTIONS) ×2 IMPLANT
WAND 90 DEG TURBOVAC W/CORD (SURGICAL WAND) ×2 IMPLANT
WATER STERILE IRR 500ML POUR (IV SOLUTION) ×2 IMPLANT

## 2012-08-06 NOTE — H&P (Signed)
Pamela [name] is an 61 y.o. female.   Chief Complaint:painful rt shoulder HPI:Mri demonstrates partial RCT and picture of impingement  Past Medical History  Diagnosis Date  . Vaginitis, atrophic     recurrent  . Depression   . Arthritis KNEES, HANDS, SHOULDERS  . Impingement syndrome of right shoulder   . Hyperlipidemia, mixed     Past Surgical History  Procedure Date  . Carpal tunnel release   . Abdominal hysterectomy 1992    W/ BILATERAL SALPINGO-OOPHORECTOMY  . Tonsillectomy AS CHILD    Family History  Problem Relation Age of Onset  . Arthritis Sister      CPPD autoinmmune  . Hyperlipidemia      family hx  . Hypertension      family hx  . Sudden death      family hx  . Heart disease      family hx  . Multiple myeloma Father     deceased  . Stroke Mother 71   Social History:  reports that she has never smoked. She has never used smokeless tobacco. She reports that she drinks alcohol. She reports that she does not use illicit drugs.  Allergies:  Allergies  Allergen Reactions  . Simvastatin Other (See Comments)    JOINT ACHES    Medications Prior to Admission  Medication Sig Dispense Refill  . Cholecalciferol (D3-1000 PO) Take 1 capsule by mouth daily.      Marland Kitchen conjugated estrogens (PREMARIN) vaginal cream Place vaginally 2 (two) times a week. Or as directed  30 g  11  . DULoxetine (CYMBALTA) 60 MG capsule Take 60 mg by mouth every morning.      . Multiple Vitamin (MULTIVITAMIN) tablet Take 1 tablet by mouth daily.      . naproxen sodium (ANAPROX) 220 MG tablet Take 220 mg by mouth as needed.        Results for orders placed during the hospital encounter of 08/06/12 (from the past 48 hour(s))  POCT HEMOGLOBIN-HEMACUE     Status: Normal   Collection Time   08/06/12 11:12 AM      Component Value Range Comment   Hemoglobin 13.2  12.0 - 15.0 g/dL    No results found.  ROS  Blood pressure 132/68, pulse 74, temperature 98.2 F (36.8 C), temperature source  Oral, resp. rate 12, height 5' 4.5" (1.638 m), weight 65.318 kg (144 lb), SpO2 100.00%. Physical Exam  Constitutional: She is oriented to person, place, and time. She appears well-developed and well-nourished.  HENT:  Head: Normocephalic and atraumatic.  Right Ear: External ear normal.  Left Ear: External ear normal.  Nose: Nose normal.  Mouth/Throat: Oropharynx is clear and moist.  Eyes: Conjunctivae normal and EOM are normal. Pupils are equal, round, and reactive to light.  Neck: Normal range of motion. Neck supple.  Cardiovascular: Normal rate, regular rhythm, normal heart sounds and intact distal pulses.   Respiratory: Effort normal and breath sounds normal.  GI: Soft.  Musculoskeletal:       Rt shoulder--she has had an intersalene block  Neurological: She is alert and oriented to person, place, and time. She has normal reflexes.  Skin: Skin is warm and dry.  Psychiatric: She has a normal mood and affect. Her behavior is normal. Judgment and thought content normal.     Assessment/Plan Impingement syndrome rt shoulder with partial  RCT Rt shoulder arthroscopy with SAD  APLINGTON,JAMES P 08/06/2012, 1:08 PM

## 2012-08-06 NOTE — Progress Notes (Addendum)
bp 73/37. Color pale, skin cool, clammy. Hob loweredto supine.  Pt denies nausea.Marland Kitchen

## 2012-08-06 NOTE — Anesthesia Procedure Notes (Addendum)
Anesthesia Regional Block:  Interscalene brachial plexus block  Pre-Anesthetic Checklist: ,, timeout performed, Correct Patient, Correct Site, Correct Laterality, Correct Procedure, Correct Position, site marked, Risks and benefits discussed,  Surgical consent,  Pre-op evaluation,  At surgeon's request and post-op pain management  Laterality: Right  Prep: chloraprep       Needles:  Injection technique: Single-shot  Needle Type: Echogenic Needle          Additional Needles:  Procedures: ultrasound guided (picture in chart) Interscalene brachial plexus block Narrative:  Injection made incrementally with aspirations every 5 mL.  Performed by: Personally  Anesthesiologist: Eilene Ghazi MD  Additional Notes: Patient tolerated the procedure well without complications  Interscalene brachial plexus block Procedure Name: Intubation Date/Time: 08/06/2012 1:21 PM Performed by: Fran Lowes Pre-anesthesia Checklist: Patient identified, Emergency Drugs available, Suction available and Patient being monitored Patient Re-evaluated:Patient Re-evaluated prior to inductionOxygen Delivery Method: Circle System Utilized Preoxygenation: Pre-oxygenation with 100% oxygen Intubation Type: IV induction Ventilation: Mask ventilation without difficulty Laryngoscope Size: Mac and 3 Grade View: Grade II Tube type: Oral Tube size: 7.0 mm Number of attempts: 2 Airway Equipment and Method: stylet,  oral airway and LTA kit utilized Placement Confirmation: ETT inserted through vocal cords under direct vision,  positive ETCO2 and breath sounds checked- equal and bilateral Secured at: 22 cm Tube secured with: Tape Dental Injury: Teeth and Oropharynx as per pre-operative assessment

## 2012-08-06 NOTE — Transfer of Care (Signed)
Immediate Anesthesia Transfer of Care Note  Patient: Pamela [name]  Procedure(s) Performed: Procedure(s) (LRB): SHOULDER ARTHROSCOPY WITH SUBACROMIAL DECOMPRESSION (Right) SHOULDER ARTHROSCOPY WITH ROTATOR CUFF REPAIR (Right)  Patient Location: PACU  Anesthesia Type: General  Level of Consciousness: awake, oriented, sedated and patient cooperative  Airway & Oxygen Therapy: Patient Spontanous Breathing and Patient connected to face mask oxygen  Post-op Assessment: Report given to PACU RN and Post -op Vital signs reviewed and stable  Post vital signs: Reviewed and stable  Complications: No apparent anesthesia complications

## 2012-08-06 NOTE — Progress Notes (Signed)
Fluids opened. remains in  Supine position. Dr. Payton Doughty paged via beeper.

## 2012-08-06 NOTE — Progress Notes (Signed)
Dr. Payton Doughty informed of bp 73/37, pale denies nausea. Fluids opeded. Hob lowered w return to good color. Still clammy, cool.  Order received to give 500cc bolus. Will monitor

## 2012-08-06 NOTE — Anesthesia Preprocedure Evaluation (Signed)
Anesthesia Evaluation  Patient identified by MRN, date of birth, ID band Patient awake    Reviewed: Allergy & Precautions, H&P , NPO status , Patient's Chart, lab work & pertinent test results  Airway Mallampati: II TM Distance: <3 FB Neck ROM: Full    Dental No notable dental hx.    Pulmonary neg pulmonary ROS,  breath sounds clear to auscultation  Pulmonary exam normal       Cardiovascular hypertension, Rhythm:Regular Rate:Normal     Neuro/Psych negative neurological ROS  negative psych ROS   GI/Hepatic negative GI ROS, Neg liver ROS,   Endo/Other  negative endocrine ROS  Renal/GU negative Renal ROS  negative genitourinary   Musculoskeletal negative musculoskeletal ROS (+)   Abdominal   Peds negative pediatric ROS (+)  Hematology negative hematology ROS (+)   Anesthesia Other Findings   Reproductive/Obstetrics negative OB ROS                           Anesthesia Physical Anesthesia Plan  ASA: I  Anesthesia Plan: General   Post-op Pain Management:    Induction: Intravenous  Airway Management Planned: Oral ETT  Additional Equipment:   Intra-op Plan:   Post-operative Plan: Extubation in OR  Informed Consent: I have reviewed the patients History and Physical, chart, labs and discussed the procedure including the risks, benefits and alternatives for the proposed anesthesia with the patient or authorized representative who has indicated his/her understanding and acceptance.   Dental advisory given  Plan Discussed with: CRNA and Surgeon  Anesthesia Plan Comments:         Anesthesia Quick Evaluation

## 2012-08-06 NOTE — Brief Op Note (Signed)
08/06/2012  2:46 PM  PATIENT:  Pamela [name]  61 y.o. female  PRE-OPERATIVE DIAGNOSIS:  right shoulder chronic impingment   POST-OPERATIVE DIAGNOSIS:  right shoulder chronic impingment   PROCEDURE:  Procedure(s) (LRB) with comments: SHOULDER ARTHROSCOPY WITH SUBACROMIAL DECOMPRESSION (Right) - debridement of labral SHOULDER ARTHROSCOPY WITH ROTATOR CUFF REPAIR (Right)  SURGEON:  Surgeon(s) and Role:    * Drucilla Schmidt, MD - Primary  PHYSICIAN ASSISTANT:   ASSISTANTS: extra scrub nurse  ANESTHESIA:   regional and general  EBL:  Total I/O In: 1000 [I.V.:1000] Out: -   BLOOD ADMINISTERED:none  DRAINS: none   LOCAL MEDICATIONS USED:  MARCAINE     SPECIMEN:  No Specimen  DISPOSITION OF SPECIMEN:  N/A  COUNTS:  YES  TOURNIQUET:  * No tourniquets in log *  DICTATION: .Other Dictation: Dictation Number [id number]  PLAN OF CARE: Discharge to home after PACU  PATIENT DISPOSITION:  PACU - hemodynamically stable.   Delay start of Pharmacological VTE agent (>24hrs) due to surgical blood loss or risk of bleeding: yes

## 2012-08-07 NOTE — Anesthesia Postprocedure Evaluation (Signed)
  Anesthesia Post-op Note  Patient: Pamela [name]  Procedure(s) Performed: Procedure(s) (LRB): SHOULDER ARTHROSCOPY WITH SUBACROMIAL DECOMPRESSION (Right) SHOULDER ARTHROSCOPY WITH ROTATOR CUFF REPAIR (Right)  Patient Location: PACU  Anesthesia Type: GA combined with regional for post-op pain  Level of Consciousness: awake and alert   Airway and Oxygen Therapy: Patient Spontanous Breathing  Post-op Pain: mild  Post-op Assessment: Post-op Vital signs reviewed, Patient's Cardiovascular Status Stable, Respiratory Function Stable, Patent Airway and No signs of Nausea or vomiting  Last Vitals:  Filed Vitals:   08/06/12 1719  BP: 115/72  Pulse: 89  Temp: 36.3 C  Resp: 16    Post-op Vital Signs: stable   Complications: No apparent anesthesia complications

## 2012-08-09 ENCOUNTER — Encounter (HOSPITAL_BASED_OUTPATIENT_CLINIC_OR_DEPARTMENT_OTHER): Payer: Self-pay | Admitting: Orthopedic Surgery

## 2012-08-09 NOTE — Op Note (Signed)
NAMEMarland [name]  [name], [name]                 ACCOUNT NO.:  [id number]  MEDICAL RECORD NO.:  [id number]  LOCATION:                                 FACILITY:  PHYSICIAN:  Marlowe Kays, M.D.  DATE OF BIRTH:  06-28-1951  DATE OF PROCEDURE:  08/06/2012 DATE OF DISCHARGE:                              OPERATIVE REPORT   PREOPERATIVE DIAGNOSES: 1. Possible labral tear. 2. Partial rotator cuff tear secondary to impingement.  POSTOPERATIVE DIAGNOSES: 1. Labral tear. 2. Tear of articular surface of rotator cuff. 3. Partial tear of subscapularis tendon. 4. Chronic impingement syndrome with partial rotator cuff tear.  OPERATION: 1. Right shoulder arthroscopy. 2. Debridement of labrum, the articular surface of the rotator cuff     and the subscapularis tendon. 3. Arthroscopic subacromial decompression.  SURGEON:  Marlowe Kays, M.D.  ASSISTANT:  Extra scrub tech.  ANESTHESIA:  General preceded by interscalene block.  PATHOLOGY AND JUSTIFICATION FOR PROCEDURE:  Chronic pain with an MRI demonstrating the preoperative diagnoses.  Consequently, she is here for the above-mentioned surgery.  PROCEDURE:  Prophylactic antibiotics, satisfactory interscalene block, beach-chair position on the sliding frame with general anesthesia. Right shoulder girdle was prepped with DuraPrep, draped in sterile field.  Anatomy of the shoulder joint was marked out and for hemostatic purposes, I injected the posterior and lateral portal sites and the subacromial space.  Through posterior soft spot portal, I atraumatically entered the glenohumeral joint with the postop diagnoses noted. Consequently, I advanced the scope between the biceps tendon subscapularis using switching stick, made an anterior incision over the switching stick followed by a metal cannula and a 4.2 shaver, which I entered the joint.  I then debrided down the labrum, the underneath surface of the rotator cuff and the subscapularis tendon.  I  then redirected the scope to the subacromial space and through a lateral portal, placed a 4.2 shaver.  She had a good bit of bursitis, which I pictured.  After clearing this out, I then brought in the ArthroCare 90- degree vaporizer and removed soft tissue from the undersurface of the acromion back to the [facility] joint.  The distal clavicle did not appear to be impinging creating impingement problem on the rotator cuff.  I then used a 4-mm oval bur to begin burring down.  The underneath surface of the acromion went back and forth between the bur, shaver and the vaporizer until we had a wide decompression of the subacromial space documented with pictures.  I then cleared the subacromial space of fluid and closed the three portals with interrupted 4-0 nylon mattress sutures.  Betadine, Adaptic, dry sterile dressing, shoulder immobilizer applied.  She tolerated the procedure well, was taken to the recovery room in satisfactory condition with no known complications.          ______________________________ Marlowe Kays, M.D.     JA/MEDQ  D:  08/06/2012  T:  08/07/2012  Job:  [id number]

## 2012-11-05 ENCOUNTER — Other Ambulatory Visit: Payer: Self-pay | Admitting: Orthopedic Surgery

## 2012-11-05 DIAGNOSIS — M25511 Pain in right shoulder: Secondary | ICD-10-CM

## 2012-11-08 ENCOUNTER — Other Ambulatory Visit: Payer: BC Managed Care – PPO

## 2012-11-10 ENCOUNTER — Ambulatory Visit
Admission: RE | Admit: 2012-11-10 | Discharge: 2012-11-10 | Disposition: A | Discharge status: Home / Self Care | Payer: BC Managed Care – PPO | Source: Ambulatory Visit | Attending: Orthopedic Surgery | Admitting: Orthopedic Surgery

## 2012-11-10 DIAGNOSIS — M25511 Pain in right shoulder: Secondary | ICD-10-CM

## 2012-11-10 MED ORDER — IOHEXOL 300 MG/ML  SOLN
15.0000 mL | Freq: Once | INTRAMUSCULAR | Status: AC | PRN
  Administered 2012-11-10: 15 mL via INTRA_ARTICULAR

## 2012-12-22 ENCOUNTER — Emergency Department (HOSPITAL_COMMUNITY)
Admission: EM | Admit: 2012-12-22 | Discharge: 2012-12-22 | Disposition: A | Discharge status: Home / Self Care | Payer: BC Managed Care – PPO | Attending: Emergency Medicine | Admitting: Emergency Medicine

## 2012-12-22 ENCOUNTER — Emergency Department (HOSPITAL_COMMUNITY): Payer: BC Managed Care – PPO

## 2012-12-22 ENCOUNTER — Encounter (HOSPITAL_COMMUNITY): Payer: Self-pay | Admitting: Nurse Practitioner

## 2012-12-22 DIAGNOSIS — S0101XA Laceration without foreign body of scalp, initial encounter: Secondary | ICD-10-CM

## 2012-12-22 DIAGNOSIS — Z8639 Personal history of other endocrine, nutritional and metabolic disease: Secondary | ICD-10-CM | POA: Insufficient documentation

## 2012-12-22 DIAGNOSIS — Z862 Personal history of diseases of the blood and blood-forming organs and certain disorders involving the immune mechanism: Secondary | ICD-10-CM | POA: Insufficient documentation

## 2012-12-22 DIAGNOSIS — S4980XA Other specified injuries of shoulder and upper arm, unspecified arm, initial encounter: Secondary | ICD-10-CM | POA: Insufficient documentation

## 2012-12-22 DIAGNOSIS — Z8739 Personal history of other diseases of the musculoskeletal system and connective tissue: Secondary | ICD-10-CM | POA: Insufficient documentation

## 2012-12-22 DIAGNOSIS — S0100XA Unspecified open wound of scalp, initial encounter: Secondary | ICD-10-CM | POA: Insufficient documentation

## 2012-12-22 DIAGNOSIS — Y9389 Activity, other specified: Secondary | ICD-10-CM | POA: Insufficient documentation

## 2012-12-22 DIAGNOSIS — F3289 Other specified depressive episodes: Secondary | ICD-10-CM | POA: Insufficient documentation

## 2012-12-22 DIAGNOSIS — R55 Syncope and collapse: Secondary | ICD-10-CM | POA: Insufficient documentation

## 2012-12-22 DIAGNOSIS — F329 Major depressive disorder, single episode, unspecified: Secondary | ICD-10-CM | POA: Insufficient documentation

## 2012-12-22 DIAGNOSIS — W1809XA Striking against other object with subsequent fall, initial encounter: Secondary | ICD-10-CM | POA: Insufficient documentation

## 2012-12-22 DIAGNOSIS — S0993XA Unspecified injury of face, initial encounter: Secondary | ICD-10-CM | POA: Insufficient documentation

## 2012-12-22 DIAGNOSIS — S46909A Unspecified injury of unspecified muscle, fascia and tendon at shoulder and upper arm level, unspecified arm, initial encounter: Secondary | ICD-10-CM | POA: Insufficient documentation

## 2012-12-22 DIAGNOSIS — S022XXA Fracture of nasal bones, initial encounter for closed fracture: Secondary | ICD-10-CM

## 2012-12-22 DIAGNOSIS — Y929 Unspecified place or not applicable: Secondary | ICD-10-CM | POA: Insufficient documentation

## 2012-12-22 DIAGNOSIS — Z79899 Other long term (current) drug therapy: Secondary | ICD-10-CM | POA: Insufficient documentation

## 2012-12-22 LAB — CBC WITH DIFFERENTIAL/PLATELET
Basophils Relative: 0 % (ref 0–1)
Eosinophils Absolute: 0 10*3/uL (ref 0.0–0.7)
Hemoglobin: 13 g/dL (ref 12.0–15.0)
Lymphs Abs: 1.6 10*3/uL (ref 0.7–4.0)
MCHC: 34.9 g/dL (ref 30.0–36.0)
Monocytes Relative: 6 % (ref 3–12)
Neutro Abs: 9.2 10*3/uL — ABNORMAL HIGH (ref 1.7–7.7)
Neutrophils Relative %: 79 % — ABNORMAL HIGH (ref 43–77)
Platelets: 246 10*3/uL (ref 150–400)
RBC: 4.68 MIL/uL (ref 3.87–5.11)

## 2012-12-22 LAB — BASIC METABOLIC PANEL
BUN: 19 mg/dL (ref 6–23)
Chloride: 102 mEq/L (ref 96–112)
GFR calc Af Amer: >90 mL/min (ref >90)
GFR calc non Af Amer: 87 mL/min — ABNORMAL LOW (ref >90)
Potassium: 4.1 mEq/L (ref 3.5–5.1)
Sodium: 139 mEq/L (ref 135–145)

## 2012-12-22 MED ORDER — SODIUM CHLORIDE 0.9 % IV BOLUS (SEPSIS)
500.0000 mL | Freq: Once | INTRAVENOUS | Status: AC
  Administered 2012-12-22: 500 mL via INTRAVENOUS

## 2012-12-22 MED ORDER — HYDROCODONE-ACETAMINOPHEN 5-325 MG PO TABS: 1.0000 | ORAL_TABLET | Freq: Four times a day (QID) | ORAL | Status: DC | PRN

## 2012-12-22 NOTE — ED Notes (Signed)
Ice applied to nose

## 2012-12-22 NOTE — ED Notes (Signed)
Patient transported to radiology.will attempt to obtain blood for labs upon patient's return 

## 2012-12-22 NOTE — ED Provider Notes (Signed)
History     CSN: [id number] Arrival date & time 12/22/12  1258 First MD Initiated Contact with Patient 12/22/12 1407      Chief Complaint  Patient presents with  . Fall  . Headache    due to fall   . Neck Pain    due to fall  . Shoulder Pain    due to fall   HPI Pt was taking a very hot bath.  More than usual.  Pt was actually sweating in the tub.  She started to feel lightheaded.  She tried to put on cool water but when she tried to get out of the tub she still felt lightheaded.  She had a syncopal episode and hit her nose.  Her husband found her on the bathroom floor flat on her face.  She had injured her nose and there was some bleeding.  The syncopal episode around noon.  This lasted for a few minutes.  She has pain in her nose, shoulder and neck.  Her shoulder is a little sore but nothing severe.  No CP, or SOB.  NO vomiting or diarrhea.  Past Medical History  Diagnosis Date  . Vaginitis, atrophic     recurrent  . Depression   . Arthritis KNEES, HANDS, SHOULDERS  . Impingement syndrome of right shoulder   . Hyperlipidemia, mixed     Past Surgical History  Procedure Laterality Date  . Carpal tunnel release    . Abdominal hysterectomy  1992    W/ BILATERAL SALPINGO-OOPHORECTOMY  . Tonsillectomy  AS CHILD  . Shoulder arthroscopy with subacromial decompression  08/06/2012    Procedure: SHOULDER ARTHROSCOPY WITH SUBACROMIAL DECOMPRESSION;  Surgeon: Drucilla Schmidt, MD;  Location: [facility];  Service: Orthopedics;  Laterality: Right;  debridement of labral  . Shoulder arthroscopy with rotator cuff repair  08/06/2012    Procedure: SHOULDER ARTHROSCOPY WITH ROTATOR CUFF REPAIR;  Surgeon: Drucilla Schmidt, MD;  Location: [facility];  Service: Orthopedics;  Laterality: Right;    Family History  Problem Relation Age of Onset  . Arthritis Sister      CPPD autoinmmune  . Hyperlipidemia      family hx  . Hypertension      family hx  .  Sudden death      family hx  . Heart disease      family hx  . Multiple myeloma Father     deceased  . Stroke Mother 79    History  Substance Use Topics  . Smoking status: Never Smoker   . Smokeless tobacco: Never Used  . Alcohol Use: Yes     Comment: OCCASIONAL    OB History   Grav Para Term Preterm Abortions TAB SAB Ect Mult Living                  Review of Systems  All other systems reviewed and are negative.    Allergies  Simvastatin  Home Medications   Current Outpatient Rx  Name  Route  Sig  Dispense  Refill  . Cholecalciferol (D3-1000 PO)   Oral   Take 1 capsule by mouth daily.         . DULoxetine (CYMBALTA) 60 MG capsule   Oral   Take 60 mg by mouth every morning.         . methocarbamol (ROBAXIN) 500 MG tablet   Oral   Take 500 mg by mouth 4 (four) times daily as needed.         Marland Kitchen  Multiple Vitamin (MULTIVITAMIN) tablet   Oral   Take 1 tablet by mouth daily.         . naproxen sodium (ANAPROX) 220 MG tablet   Oral   Take 220 mg by mouth as needed (pain).          Marland Kitchen HYDROcodone-acetaminophen (NORCO) 5-325 MG per tablet   Oral   Take 1-2 tablets by mouth every 6 (six) hours as needed for pain.   24 tablet   0     BP 133/71  Temp(Src) 97.6 F (36.4 C) (Oral)  Resp 13  Ht 5' 4.5" (1.638 m)  Wt 146 lb (66.225 kg)  BMI 24.68 kg/m2  SpO2 100%  Physical Exam  Nursing note and vitals reviewed. Constitutional: She appears well-developed and well-nourished. No distress.  HENT:  Head: Normocephalic.  Right Ear: External ear normal.  Left Ear: External ear normal.  bruising face and nares, small abrasion/superficial laceration bridge of nose; small 1 cm scalp laceration posterior right side  Eyes: Conjunctivae are normal. Right eye exhibits no discharge. Left eye exhibits no discharge. No scleral icterus.  Neck: Neck supple. No tracheal deviation present.  Paraspinal ttp greatest on right  Cardiovascular: Normal rate, regular  rhythm and intact distal pulses.   Pulmonary/Chest: Effort normal and breath sounds normal. No stridor. No respiratory distress. She has no wheezes. She has no rales.  Abdominal: Soft. Bowel sounds are normal. She exhibits no distension. There is no tenderness. There is no rebound and no guarding.  Musculoskeletal: She exhibits no edema and no tenderness.  No ttp shoulder, full rom right shoulder without discomfort  Neurological: She is alert. She has normal strength. No sensory deficit. Cranial nerve deficit:  no gross defecits noted. She exhibits normal muscle tone. She displays no seizure activity. Coordination normal.  Skin: Skin is warm and dry. No rash noted.  Psychiatric: She has a normal mood and affect.    ED Course  Procedures (including critical care time)  EKG Normal sinus rhythm rate 71 Normal axis, normal intervals Normal ST-T waves  Labs Reviewed  CBC WITH DIFFERENTIAL - Abnormal; Notable for the following:    WBC 11.6 (*)    Neutrophils Relative 79 (*)    Neutro Abs 9.2 (*)    All other components within normal limits  BASIC METABOLIC PANEL - Abnormal; Notable for the following:    GFR calc non Af Amer 87 (*)    All other components within normal limits   Ct Head Wo Contrast  12/22/2012  *RADIOLOGY REPORT*  Clinical Data:  Fall.  Headache, neck pain.  Nose and right facial pain.  CT HEAD WITHOUT CONTRAST CT MAXILLOFACIAL WITHOUT CONTRAST CT CERVICAL SPINE WITHOUT CONTRAST  Technique:  Multidetector CT imaging of the head, cervical spine, and maxillofacial structures were performed using the standard protocol without intravenous contrast. Multiplanar CT image reconstructions of the cervical spine and maxillofacial structures were also generated.  Comparison:  None.  CT HEAD  Findings: Old lacunar infarct in the right basal ganglia. No acute intracranial abnormality.  Specifically, no hemorrhage, hydrocephalus, mass lesion, acute infarction, or significant intracranial  injury.  No acute calvarial abnormality.  IMPRESSION: No acute intracranial abnormality.  CT MAXILLOFACIAL  Findings:  There is irregularity noted at the tip of the nose and in the right nasal bones compatible with nasal bone fractures. Orbital and sinus walls are intact.  Orbital soft tissues are unremarkable.  Flattening of the left mandibular head suggesting degenerative changes.  No acute  mandibular abnormality.  Zygomatic arches are intact.  Paranasal sinuses are clear.  IMPRESSION: Nasal bone fractures near the midline at the tip of and to the right of midline.  CT CERVICAL SPINE  Findings:   Degenerative disc disease changes at C5-6 and C6-7. Degenerative facet disease bilaterally.  Normal alignment.  No fracture.  No epidural or paraspinal hematoma.  Prevertebral soft tissues are normal.  IMPRESSION: Degenerative disc and facet disease.  No acute findings.   Original Report Authenticated By: Charlett Nose, M.D.    Ct Cervical Spine Wo Contrast  12/22/2012  *RADIOLOGY REPORT*  Clinical Data:  Fall.  Headache, neck pain.  Nose and right facial pain.  CT HEAD WITHOUT CONTRAST CT MAXILLOFACIAL WITHOUT CONTRAST CT CERVICAL SPINE WITHOUT CONTRAST  Technique:  Multidetector CT imaging of the head, cervical spine, and maxillofacial structures were performed using the standard protocol without intravenous contrast. Multiplanar CT image reconstructions of the cervical spine and maxillofacial structures were also generated.  Comparison:  None.  CT HEAD  Findings: Old lacunar infarct in the right basal ganglia. No acute intracranial abnormality.  Specifically, no hemorrhage, hydrocephalus, mass lesion, acute infarction, or significant intracranial injury.  No acute calvarial abnormality.  IMPRESSION: No acute intracranial abnormality.  CT MAXILLOFACIAL  Findings:  There is irregularity noted at the tip of the nose and in the right nasal bones compatible with nasal bone fractures. Orbital and sinus walls are intact.   Orbital soft tissues are unremarkable.  Flattening of the left mandibular head suggesting degenerative changes.  No acute mandibular abnormality.  Zygomatic arches are intact.  Paranasal sinuses are clear.  IMPRESSION: Nasal bone fractures near the midline at the tip of and to the right of midline.  CT CERVICAL SPINE  Findings:   Degenerative disc disease changes at C5-6 and C6-7. Degenerative facet disease bilaterally.  Normal alignment.  No fracture.  No epidural or paraspinal hematoma.  Prevertebral soft tissues are normal.  IMPRESSION: Degenerative disc and facet disease.  No acute findings.   Original Report Authenticated By: Charlett Nose, M.D.    Ct Maxillofacial Wo Cm  12/22/2012  *RADIOLOGY REPORT*  Clinical Data:  Fall.  Headache, neck pain.  Nose and right facial pain.  CT HEAD WITHOUT CONTRAST CT MAXILLOFACIAL WITHOUT CONTRAST CT CERVICAL SPINE WITHOUT CONTRAST  Technique:  Multidetector CT imaging of the head, cervical spine, and maxillofacial structures were performed using the standard protocol without intravenous contrast. Multiplanar CT image reconstructions of the cervical spine and maxillofacial structures were also generated.  Comparison:  None.  CT HEAD  Findings: Old lacunar infarct in the right basal ganglia. No acute intracranial abnormality.  Specifically, no hemorrhage, hydrocephalus, mass lesion, acute infarction, or significant intracranial injury.  No acute calvarial abnormality.  IMPRESSION: No acute intracranial abnormality.  CT MAXILLOFACIAL  Findings:  There is irregularity noted at the tip of the nose and in the right nasal bones compatible with nasal bone fractures. Orbital and sinus walls are intact.  Orbital soft tissues are unremarkable.  Flattening of the left mandibular head suggesting degenerative changes.  No acute mandibular abnormality.  Zygomatic arches are intact.  Paranasal sinuses are clear.  IMPRESSION: Nasal bone fractures near the midline at the tip of and to the  right of midline.  CT CERVICAL SPINE  Findings:   Degenerative disc disease changes at C5-6 and C6-7. Degenerative facet disease bilaterally.  Normal alignment.  No fracture.  No epidural or paraspinal hematoma.  Prevertebral soft tissues are normal.  IMPRESSION: Degenerative  disc and facet disease.  No acute findings.   Original Report Authenticated By: Charlett Nose, M.D.      1. Vasovagal syncope   2. Nasal fracture, closed, initial encounter   3. Scalp laceration, initial encounter     MDM  Syncope Patient's symptoms are pretty consistent with vasovagal syncope. She had been sitting in a hot tub and got dizzy and lightheaded. The patient's laboratory tests are unremarkable. She is no longer hypotensive or bradycardic here.  Traumatic injuries Patient had a very mild scalp laceration. Do not feel suture or staple closure is necessary. She is very minor nasal laceration it does not according them closer. Considering her nasal fracture we'll start her on antibiotics.        Celene Kras, MD 12/22/12 [phone]

## 2012-12-22 NOTE — ED Notes (Signed)
Per Pt:  Pt felt dizzy, nauseated and light-headed (taking hot bath, then stood up and rinsed under cool water because pt felt hot) prior to passing out (lost consciousness- pt doesn't remember falling); pt gained consciousness in tub.  Pt stepped out of tub and lost consciousness again.  Pt's husband fell pt face down on vinyl flooring in bathroom. Pt took Cymbalta this morning and has been mostly drinking coffee.  Pt had right shoulder surgery Dec. 2013.

## 2013-04-27 ENCOUNTER — Other Ambulatory Visit: Payer: Self-pay | Admitting: Internal Medicine

## 2013-04-27 DIAGNOSIS — Z1231 Encounter for screening mammogram for malignant neoplasm of breast: Secondary | ICD-10-CM

## 2013-05-11 ENCOUNTER — Ambulatory Visit (HOSPITAL_COMMUNITY)
Admission: RE | Admit: 2013-05-11 | Discharge: 2013-05-11 | Disposition: A | Discharge status: Home / Self Care | Payer: BC Managed Care – PPO | Source: Ambulatory Visit | Attending: Internal Medicine | Admitting: Internal Medicine

## 2013-05-11 DIAGNOSIS — Z1231 Encounter for screening mammogram for malignant neoplasm of breast: Secondary | ICD-10-CM | POA: Insufficient documentation

## 2013-05-12 ENCOUNTER — Other Ambulatory Visit: Payer: Self-pay | Admitting: Internal Medicine

## 2013-05-12 DIAGNOSIS — R928 Other abnormal and inconclusive findings on diagnostic imaging of breast: Secondary | ICD-10-CM

## 2013-05-25 ENCOUNTER — Other Ambulatory Visit: Payer: Self-pay | Admitting: Internal Medicine

## 2013-05-26 ENCOUNTER — Ambulatory Visit
Admission: RE | Admit: 2013-05-26 | Discharge: 2013-05-26 | Disposition: A | Discharge status: Home / Self Care | Payer: BC Managed Care – PPO | Source: Ambulatory Visit | Attending: Internal Medicine | Admitting: Internal Medicine

## 2013-05-26 DIAGNOSIS — R928 Other abnormal and inconclusive findings on diagnostic imaging of breast: Secondary | ICD-10-CM

## 2013-06-13 ENCOUNTER — Other Ambulatory Visit (INDEPENDENT_AMBULATORY_CARE_PROVIDER_SITE_OTHER): Payer: BC Managed Care – PPO

## 2013-06-13 DIAGNOSIS — Z0000 Encounter for general adult medical examination without abnormal findings: Secondary | ICD-10-CM

## 2013-06-13 DIAGNOSIS — E782 Mixed hyperlipidemia: Secondary | ICD-10-CM

## 2013-06-13 LAB — CBC WITH DIFFERENTIAL/PLATELET
Basophils Relative: 1.1 % (ref 0.0–3.0)
Eosinophils Absolute: 0.1 10*3/uL (ref 0.0–0.7)
Eosinophils Relative: 1.6 % (ref 0.0–5.0)
HCT: 40.3 % (ref 36.0–46.0)
Hemoglobin: 13.6 g/dL (ref 12.0–15.0)
Lymphs Abs: 2.5 10*3/uL (ref 0.7–4.0)
MCHC: 33.8 g/dL (ref 30.0–36.0)
MCV: 82.9 fl (ref 78.0–100.0)
Monocytes Absolute: 0.4 10*3/uL (ref 0.1–1.0)
Neutro Abs: 2.3 10*3/uL (ref 1.4–7.7)
Neutrophils Relative %: 42.3 % — ABNORMAL LOW (ref 43.0–77.0)
RBC: 4.86 Mil/uL (ref 3.87–5.11)
WBC: 5.4 10*3/uL (ref 4.5–10.5)

## 2013-06-13 LAB — HEPATIC FUNCTION PANEL
ALT: 19 U/L (ref 0–35)
Albumin: 4.4 g/dL (ref 3.5–5.2)
Bilirubin, Direct: 0 mg/dL (ref 0.0–0.3)
Total Protein: 6.9 g/dL (ref 6.0–8.3)

## 2013-06-13 LAB — LIPID PANEL
Cholesterol: 262 mg/dL — ABNORMAL HIGH (ref 0–200)
Total CHOL/HDL Ratio: 5
Triglycerides: 128 mg/dL (ref 0.0–149.0)

## 2013-06-13 LAB — BASIC METABOLIC PANEL
CO2: 30 mEq/L (ref 19–32)
Chloride: 103 mEq/L (ref 96–112)
Creatinine, Ser: 0.9 mg/dL (ref 0.4–1.2)
Potassium: 4.4 mEq/L (ref 3.5–5.1)
Sodium: 140 mEq/L (ref 135–145)

## 2013-06-20 ENCOUNTER — Encounter: Payer: Self-pay | Admitting: Internal Medicine

## 2013-06-20 ENCOUNTER — Ambulatory Visit (INDEPENDENT_AMBULATORY_CARE_PROVIDER_SITE_OTHER): Payer: BC Managed Care – PPO | Admitting: Internal Medicine

## 2013-06-20 VITALS — BP 122/80 | HR 75 | Temp 98.2°F | Ht 64.0 in | Wt 148.0 lb

## 2013-06-20 DIAGNOSIS — Z23 Encounter for immunization: Secondary | ICD-10-CM

## 2013-06-20 DIAGNOSIS — Z0000 Encounter for general adult medical examination without abnormal findings: Secondary | ICD-10-CM | POA: Insufficient documentation

## 2013-06-20 DIAGNOSIS — K6389 Other specified diseases of intestine: Secondary | ICD-10-CM | POA: Insufficient documentation

## 2013-06-20 DIAGNOSIS — N952 Postmenopausal atrophic vaginitis: Secondary | ICD-10-CM

## 2013-06-20 DIAGNOSIS — E782 Mixed hyperlipidemia: Secondary | ICD-10-CM

## 2013-06-20 DIAGNOSIS — Z79899 Other long term (current) drug therapy: Secondary | ICD-10-CM

## 2013-06-20 HISTORY — DX: Other specified diseases of intestine: K63.89

## 2013-06-20 MED ORDER — DULOXETINE HCL 60 MG PO CPEP: ORAL_CAPSULE | ORAL | Status: DC

## 2013-06-20 MED ORDER — ESTROGENS, CONJUGATED 0.625 MG/GM VA CREA: TOPICAL_CREAM | VAGINAL | Status: DC

## 2013-06-20 MED ORDER — ATORVASTATIN CALCIUM 10 MG PO TABS: 10.0000 mg | ORAL_TABLET | Freq: Every day | ORAL | Status: DC

## 2013-06-20 NOTE — Progress Notes (Signed)
Chief Complaint  Patient presents with  . Annual Exam    HPI: Patient comes in today for Preventive Health Care visit  No major change in health status since last visit . Dr Loreta Ave bacterial overgrowth  rifaxim   Helped so far  And probiotic  Had knee arthro  Has knee arthritis  DR Charlann Boxer.  ocass etoh.  Neg tobacco  Exercise  Modified  Recumbent  And weight.  Yoga.  cymbalta doin well. Needs refill  Also prem vaginal no utis recently ROS:  GEN/ HEENT: No fever, significant weight changes sweats headaches vision problems hearing changes, CV/ PULM; No chest pain shortness of breath cough, syncope,edema  change in exercise tolerance. GI /GU: No adominal pain, vomiting, change in bowel habits. No blood in the stool. No significant GU symptoms. SKIN/HEME: ,no acute skin rashes suspicious lesions or bleeding. No lymphadenopathy, nodules, masses.  NEURO/ PSYCH:  No neurologic signs such as weakness numbness. No depression anxiety. IMM/ Allergy: No unusual infections.  Allergy .   REST of 12 system review negative except as per HPI   Past Medical History  Diagnosis Date  . Vaginitis, atrophic     recurrent  . Depression   . Arthritis KNEES, HANDS, SHOULDERS  . Impingement syndrome of right shoulder   . Hyperlipidemia, mixed   . Hx of Clostridium difficile infection     Family History  Problem Relation Age of Onset  . Arthritis Sister      CPPD autoinmmune  . Hyperlipidemia      family hx  . Hypertension      family hx  . Sudden death      family hx  . Heart disease      family hx  . Multiple myeloma Father     deceased  . Stroke Mother 69    History   Social History  . Marital Status: Married    Spouse Name: N/A    Number of Children: N/A  . Years of Education: N/A   Occupational History  . retired    Social History Main Topics  . Smoking status: Never Smoker   . Smokeless tobacco: Never Used  . Alcohol Use: Yes     Comment: OCCASIONAL  . Drug Use: No  .  Sexual Activity: None   Other Topics Concern  . 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  Has smoke detector and wears seat belts.  No firearms. No excess sun exposure. Sees dentist regularly .   Dexa nl 2005 and 2008    Outpatient Encounter Prescriptions as of 06/20/2013  Medication Sig Dispense Refill  . Cholecalciferol (D3-1000 PO) Take 1 capsule by mouth daily.      Marland Kitchen conjugated estrogens (PREMARIN) vaginal cream Use vaginally as directed  42.5 g  11  . DULoxetine (CYMBALTA) 60 MG capsule TAKE 1 CAPSULE (60 MG TOTAL) BY MOUTH DAILY.  90 capsule  3  . [DISCONTINUED] DULoxetine (CYMBALTA) 60 MG capsule TAKE 1 CAPSULE (60 MG TOTAL) BY MOUTH DAILY.  30 capsule  0  . [DISCONTINUED] PREMARIN vaginal cream       . atorvastatin (LIPITOR) 10 MG tablet Take 1 tablet (10 mg total) by mouth daily.  90 tablet  3  . [DISCONTINUED] DULoxetine (CYMBALTA) 60 MG capsule Take 60 mg by mouth every  morning.      . [DISCONTINUED] HYDROcodone-acetaminophen (NORCO) 5-325 MG per tablet Take 1-2 tablets by mouth every 6 (six) hours as needed for pain.  24 tablet  0  . [DISCONTINUED] methocarbamol (ROBAXIN) 500 MG tablet Take 500 mg by mouth 4 (four) times daily as needed.      . [DISCONTINUED] Multiple Vitamin (MULTIVITAMIN) tablet Take 1 tablet by mouth daily.      . [DISCONTINUED] naproxen sodium (ANAPROX) 220 MG tablet Take 220 mg by mouth as needed (pain).        No facility-administered encounter medications on file as of 06/20/2013.    EXAM:  BP 122/80  Pulse 75  Temp(Src) 98.2 F (36.8 C) (Oral)  Ht 5\' 4"  (1.626 m)  Wt 148 lb (67.132 kg)  BMI 25.39 kg/m2  SpO2 98%  Body mass index is 25.39 kg/(m^2).  Physical Exam: Vital signs reviewed [id number] is a well-developed well-nourished alert cooperative   female who appears her stated age in no acute distress.  HEENT: normocephalic  atraumatic , Eyes: PERRL EOM's full, conjunctiva clear,  glassesNares: paten,t no deformity discharge or tenderness., Ears: no deformity EAC's clear TMs with normal landmarks. Mouth: clear OP, no lesions, edema.  Moist mucous membranes. Dentition in adequate repair. NECK: supple without masses, thyromegaly or bruits. CHEST/PULM:  Clear to auscultation and percussion breath sounds equal no wheeze , rales or rhonchi. No chest wall deformities or tenderness. Breast: normal by inspection . No dimpling, discharge, masses, tenderness or discharge . CV: PMI is nondisplaced, S1 S2 no gallops, murmurs, rubs. Peripheral pulses are full without delay.No JVD .  ABDOMEN: Bowel sounds normal nontender  No guard or rebound, no hepato splenomegal no CVA tenderness.  No hernia. Extremtities:  No clubbing cyanosis or edema, no acute joint swelling or redness no focal atrophy NEURO:  Oriented x3, cranial nerves 3-12 appear to be intact, no obvious focal weakness,gait within normal limits no abnormal reflexes or asymmetrical SKIN: No acute rashes normal turgor, color, no bruising or petechiae. PSYCH: Oriented, good eye contact, no obvious depression anxiety, cognition and judgment appear normal. LN: no cervical axillary inguinal adenopathy  Lab Results  Component Value Date   WBC 5.4 06/13/2013   HGB 13.6 06/13/2013   HCT 40.3 06/13/2013   PLT 251.0 06/13/2013   GLUCOSE 90 06/13/2013   CHOL 262* 06/13/2013   TRIG 128.0 06/13/2013   HDL 57.40 06/13/2013   LDLDIRECT 173.5 06/13/2013   LDLCALC 93 05/10/2009   ALT 19 06/13/2013   AST 21 06/13/2013   NA 140 06/13/2013   K 4.4 06/13/2013   CL 103 06/13/2013   CREATININE 0.9 06/13/2013   BUN 14 06/13/2013   CO2 30 06/13/2013   TSH 3.08 06/13/2013    ASSESSMENT AND PLAN:  Discussed the following assessment and plan:  Visit for preventive health examination  HYPERLIPIDEMIA - goo dlsi genetic perdispo has se of simva consider trial low dose atorva lab only  if tolerates,mom on crestor  - Plan: Lipid panel  Medication management - continue cymbalta  Postmenopausal atrophic vaginitis - refil med  Need for prophylactic vaccination and inoculation against influenza - Plan: Flu Vaccine QUAD 36+ mos PF IM (Fluarix)  Intestinal bacterial overgrowth Counseled regarding healthy nutrition, exercise, sleep, injury prevention, calcium vit d and healthy weight .  Patient Care Team: Madelin Headings, MD as PCP - General Shelda Pal, MD (Orthopedic Surgery) [facility] Bjorn Loser, MD (Dermatology) Charna Elizabeth, MD as Attending Physician (Gastroenterology) Shelda Pal, MD  as Consulting Physician (Orthopedic Surgery) Patient Instructions  Continue lifestyle intervention healthy eating and exercise . 150 minutes of exercise weeks  , weight below 29 bmi healthy levels. Avoid trans fats and processed foods;   fresh fruits and veges to 5 servings per day. And avoid sweet beverages  Including tea and juice.  Can try generic lipitor   10 mg per day and get lab appt for 2-3 month after beginning   If doing ok . If has se then contact our office for advice .    Neta Mends. Shylah Dossantos M.D. Health Maintenance  Topic Date Due  . Influenza Vaccine  03/25/2014  . Mammogram  05/27/2015  . Tetanus/tdap  07/05/2017  . Colonoscopy  12/07/2018  . Zostavax  Completed   Health Maintenance Review }

## 2013-06-20 NOTE — Patient Instructions (Addendum)
Continue lifestyle intervention healthy eating and exercise . 150 minutes of exercise weeks  , weight below 29 bmi healthy levels. Avoid trans fats and processed foods;   fresh fruits and veges to 5 servings per day. And avoid sweet beverages  Including tea and juice.  Can try generic lipitor   10 mg per day and get lab appt for 2-3 month after beginning   If doing ok . If has se then contact our office for advice .

## 2013-07-26 ENCOUNTER — Other Ambulatory Visit: Payer: Self-pay | Admitting: Dermatology

## 2013-11-22 ENCOUNTER — Other Ambulatory Visit (INDEPENDENT_AMBULATORY_CARE_PROVIDER_SITE_OTHER): Payer: BC Managed Care – PPO

## 2013-11-22 ENCOUNTER — Telehealth: Payer: Self-pay | Admitting: Internal Medicine

## 2013-11-22 DIAGNOSIS — E782 Mixed hyperlipidemia: Secondary | ICD-10-CM

## 2013-11-22 LAB — LIPID PANEL
CHOLESTEROL: 162 mg/dL (ref 0–200)
HDL: 62.3 mg/dL (ref >39.00)
LDL CALC: 83 mg/dL (ref 0–99)
Total CHOL/HDL Ratio: 3
Triglycerides: 83 mg/dL (ref 0.0–149.0)
VLDL: 16.6 mg/dL (ref 0.0–40.0)

## 2013-11-22 NOTE — Telephone Encounter (Signed)
Usually doesn't cause joint pain  Could cause muscle pain . Offer her an OV .  If she wishes she can stop temporarily in the short run . Until evaluation complete by ortho  And [place]

## 2013-11-22 NOTE — Telephone Encounter (Signed)
Spoke to the pt.  She has chronic knee pain but it has become so much worse.  She thinks it may be due to the Lipitor.  Has been on it for 3.5 months.  She made an appt with ortho at the end of April (first available) just to make sure.  She has been unable to get out of bed today and move around.  Please advise.  Thanks!

## 2013-11-22 NOTE — Telephone Encounter (Signed)
Patient is having severe pain in her knees and wants to know if it is from the arthritis or if it could be a side effect from taking Lipitor.

## 2013-11-23 NOTE — Telephone Encounter (Signed)
Spoke to the pt and she will temporarily stop the medication until she sees Ortho.  Instructed her to call after her visit for further directions from [facility]

## 2014-01-02 ENCOUNTER — Telehealth: Payer: Self-pay

## 2014-01-02 NOTE — Telephone Encounter (Signed)
Pt states that she has stopped taking her lipitor because it was causing her leg pain.  She states that she will not take any statins unless she has to, but they normally cause some kind of reaction

## 2014-01-02 NOTE — Telephone Encounter (Signed)
Noted  Will follow for now  Intensify lifestyle interventions. amd [;am recheck ar her cpx or as desired

## 2014-04-22 ENCOUNTER — Other Ambulatory Visit: Payer: Self-pay | Admitting: Internal Medicine

## 2014-04-24 NOTE — Telephone Encounter (Signed)
Denied.  Filled for 1 year in Oct. 2014

## 2014-05-15 ENCOUNTER — Other Ambulatory Visit: Payer: Self-pay | Admitting: Internal Medicine

## 2014-05-15 DIAGNOSIS — Z1231 Encounter for screening mammogram for malignant neoplasm of breast: Secondary | ICD-10-CM

## 2014-05-31 ENCOUNTER — Ambulatory Visit (HOSPITAL_COMMUNITY)
Admission: RE | Admit: 2014-05-31 | Discharge: 2014-05-31 | Disposition: A | Discharge status: Home / Self Care | Payer: 59 | Source: Ambulatory Visit | Attending: Internal Medicine | Admitting: Internal Medicine

## 2014-05-31 DIAGNOSIS — Z1231 Encounter for screening mammogram for malignant neoplasm of breast: Secondary | ICD-10-CM

## 2014-06-14 ENCOUNTER — Other Ambulatory Visit (INDEPENDENT_AMBULATORY_CARE_PROVIDER_SITE_OTHER): Payer: Medicare Other

## 2014-06-14 DIAGNOSIS — Z0000 Encounter for general adult medical examination without abnormal findings: Secondary | ICD-10-CM

## 2014-06-14 DIAGNOSIS — E782 Mixed hyperlipidemia: Secondary | ICD-10-CM

## 2014-06-14 LAB — CBC WITH DIFFERENTIAL/PLATELET
BASOS PCT: 1 % (ref 0.0–3.0)
Basophils Absolute: 0 10*3/uL (ref 0.0–0.1)
EOS PCT: 3.1 % (ref 0.0–5.0)
Eosinophils Absolute: 0.1 10*3/uL (ref 0.0–0.7)
HEMATOCRIT: 40.5 % (ref 36.0–46.0)
HEMOGLOBIN: 13.1 g/dL (ref 12.0–15.0)
LYMPHS ABS: 2.1 10*3/uL (ref 0.7–4.0)
Lymphocytes Relative: 45.7 % (ref 12.0–46.0)
MCHC: 32.3 g/dL (ref 30.0–36.0)
MCV: 84.8 fl (ref 78.0–100.0)
MONO ABS: 0.4 10*3/uL (ref 0.1–1.0)
Monocytes Relative: 8.5 % (ref 3.0–12.0)
NEUTROS ABS: 1.9 10*3/uL (ref 1.4–7.7)
Neutrophils Relative %: 41.7 % — ABNORMAL LOW (ref 43.0–77.0)
PLATELETS: 246 10*3/uL (ref 150.0–400.0)
RBC: 4.78 Mil/uL (ref 3.87–5.11)
RDW: 14.2 % (ref 11.5–15.5)
WBC: 4.5 10*3/uL (ref 4.0–10.5)

## 2014-06-14 LAB — BASIC METABOLIC PANEL
BUN: 14 mg/dL (ref 6–23)
CHLORIDE: 104 meq/L (ref 96–112)
CO2: 24 mEq/L (ref 19–32)
Calcium: 9.4 mg/dL (ref 8.4–10.5)
Creatinine, Ser: 1 mg/dL (ref 0.4–1.2)
GFR: 60.52 mL/min (ref >60.00)
Glucose, Bld: 75 mg/dL (ref 70–99)
POTASSIUM: 4.4 meq/L (ref 3.5–5.1)
SODIUM: 142 meq/L (ref 135–145)

## 2014-06-14 LAB — HEPATIC FUNCTION PANEL
ALBUMIN: 4.1 g/dL (ref 3.5–5.2)
ALT: 18 U/L (ref 0–35)
AST: 23 U/L (ref 0–37)
Alkaline Phosphatase: 47 U/L (ref 39–117)
BILIRUBIN TOTAL: 0.8 mg/dL (ref 0.2–1.2)
Bilirubin, Direct: 0 mg/dL (ref 0.0–0.3)
TOTAL PROTEIN: 6.9 g/dL (ref 6.0–8.3)

## 2014-06-14 LAB — LIPID PANEL
Cholesterol: 225 mg/dL — ABNORMAL HIGH (ref 0–200)
HDL: 61.6 mg/dL (ref >39.00)
LDL CALC: 146 mg/dL — AB (ref 0–99)
NONHDL: 163.4
Total CHOL/HDL Ratio: 4
Triglycerides: 85 mg/dL (ref 0.0–149.0)
VLDL: 17 mg/dL (ref 0.0–40.0)

## 2014-06-14 LAB — TSH: TSH: 1.19 u[IU]/mL (ref 0.35–4.50)

## 2014-06-21 ENCOUNTER — Encounter: Payer: Self-pay | Admitting: Internal Medicine

## 2014-06-21 ENCOUNTER — Ambulatory Visit (INDEPENDENT_AMBULATORY_CARE_PROVIDER_SITE_OTHER): Payer: Medicare Other | Admitting: Internal Medicine

## 2014-06-21 VITALS — BP 130/76 | Temp 98.6°F | Ht 64.0 in | Wt 151.1 lb

## 2014-06-21 DIAGNOSIS — Z79899 Other long term (current) drug therapy: Secondary | ICD-10-CM

## 2014-06-21 DIAGNOSIS — Z23 Encounter for immunization: Secondary | ICD-10-CM

## 2014-06-21 DIAGNOSIS — E782 Mixed hyperlipidemia: Secondary | ICD-10-CM

## 2014-06-21 DIAGNOSIS — Z0000 Encounter for general adult medical examination without abnormal findings: Secondary | ICD-10-CM

## 2014-06-21 MED ORDER — ESTROGENS, CONJUGATED 0.625 MG/GM VA CREA: TOPICAL_CREAM | VAGINAL | Status: DC

## 2014-06-21 MED ORDER — DULOXETINE HCL 60 MG PO CPEP: ORAL_CAPSULE | ORAL | Status: DC

## 2014-06-21 NOTE — Patient Instructions (Addendum)
Continue lifestyle intervention healthy eating and exercise . Yearly flu vvaccine  mammo when due. Your ACA 10 year risk for vascular events is 5.6 % . Acceptable to stay off statin meds and  Continue lifestyle intervention healthy eating and exercise . Follow  Attend to advance directive documents.

## 2014-06-21 NOTE — Progress Notes (Signed)
Pre visit review using our clinic review tool, if applicable. No additional management support is needed unless otherwise documented below in the visit note.   Chief Complaint  Patient presents with  . First Medicare Wellness    HPI: Patient comes in today for Preventive Medicare wellness visit . No major injuries, ed visits ,hospitalizations , new medications since last visit.  Health Maintenance  Topic Date Due  . Pneumococcal Polysaccharide Vaccine Age 59 And Over  05/04/2014  . Influenza Vaccine  03/26/2015  . Mammogram  05/31/2016  . Tetanus/tdap  07/05/2017  . Colonoscopy  12/07/2018  . Zostavax  Completed   Health Maintenance Review  LIFESTYLE:  Exercise:    Yoga and machines  Bike   Knees  Tobacco/ETS: no Alcohol: social  Sugar beverages: no Sleep: 8 hours  Drug use: no  Bone density:  Colonoscopy: ? 2010  10 year recall    Hearing:  Excellent   Vision:  No limitations at present . Last eye check UTD  Safety:  Has smoke detector and wears seat belts.  No firearms. No excess sun exposure. Sees dentist regularly.  Falls: no  Advance directive :  Reviewed .Marland Kitchen Has 5 wishes to fill out.   Memory: Felt to be good  , no concern from her or her family.  Depression: No anhedonia unusual crying or depressive symptoms  Nutrition: Eats well balanced diet; adequate calcium and vitamin D. No swallowing chewing problems.  Injury: no major injuries in the last six months.  Other healthcare providers:  Reviewed today .  Social:  Lives with spouse married..   2 1 dog  Preventive parameters: up-to-date  Reviewed   ADLS:   There are no problems or need for assistance  driving, feeding, obtaining food, dressing, toileting and bathing, managing money using phone. She is independent. Knee is limiting      ROS:  GEN/ HEENT: No fever, significant weight changes sweats headaches vision problems hearing changes, CV/ PULM; No chest pain shortness of breath cough,  syncope,edema  change in exercise tolerance. GI /GU: No adominal pain, vomiting, change in bowel habits. No blood in the stool. No significant GU symptoms. SKIN/HEME: ,no acute skin rashes suspicious lesions or bleeding. No lymphadenopathy, nodules, masses.  NEURO/ PSYCH:  No neurologic signs such as weakness numbness. No depression anxiety. Good on cymbalta  IMM/ Allergy: No unusual infections.  Allergy .   REST of 12 system review negative except as per HPI   Past Medical History  Diagnosis Date  . Vaginitis, atrophic     recurrent  . Depression   . Arthritis KNEES, HANDS, SHOULDERS  . Impingement syndrome of right shoulder   . Hyperlipidemia, mixed   . Hx of Clostridium difficile infection     Family History  Problem Relation Age of Onset  . Arthritis Sister      CPPD autoinmmune  . Hyperlipidemia      family hx  . Hypertension      family hx  . Sudden death      family hx  . Heart disease      family hx  . Multiple myeloma Father     deceased  . Stroke Mother 50    History   Social History  . Marital Status: Married    Spouse Name: N/A    Number of Children: N/A  . Years of Education: N/A   Occupational History  . retired    Social History Main Topics  . Smoking status:  Never Smoker   . Smokeless tobacco: Never Used  . Alcohol Use: Yes     Comment: OCCASIONAL  . Drug Use: No  . Sexual Activity: None   Other Topics Concern  . 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  Has smoke detector and wears seat belts.  No firearms. No excess sun exposure. Sees dentist regularly .   Dexa nl 2005 and 2008    Outpatient Encounter Prescriptions as of 06/21/2014  Medication Sig  . Cholecalciferol (D3-1000 PO) Take 1 capsule by mouth daily.  Marland Kitchen conjugated estrogens (PREMARIN) vaginal cream Use vaginally as directed  . DULoxetine (CYMBALTA) 60 MG capsule  TAKE 1 CAPSULE (60 MG TOTAL) BY MOUTH DAILY.  . [DISCONTINUED] conjugated estrogens (PREMARIN) vaginal cream Use vaginally as directed  . [DISCONTINUED] DULoxetine (CYMBALTA) 60 MG capsule TAKE 1 CAPSULE (60 MG TOTAL) BY MOUTH DAILY.    EXAM:  BP 130/76  Temp(Src) 98.6 F (37 C) (Oral)  Ht '5\' 4"'  (1.626 m)  Wt 151 lb 1.6 oz (68.539 kg)  BMI 25.92 kg/m2  Body mass index is 25.92 kg/(m^2).  Physical Exam: Vital signs reviewed [id number] is a well-developed well-nourished alert cooperative   who appears stated age in no acute distress.  HEENT: normocephalic atraumatic , Eyes: PERRL EOM's full, conjunctiva clear, Nares: paten,t no deformity discharge or tenderness., Ears: no deformity EAC's clear TMs with normal landmarks. Mouth: clear OP, no lesions, edema.  Moist mucous membranes. Dentition in adequate repair. NECK: supple without masses, thyromegaly or bruits. CHEST/PULM:  Clear to auscultation and percussion breath sounds equal no wheeze , rales or rhonchi. No chest wall deformities or tenderness. Breast: normal by inspection . No dimpling, discharge, masses, tenderness or discharge . CV: PMI is nondisplaced, S1 S2 no gallops, murmurs, rubs. Peripheral pulses are full without delay.No JVD .  ABDOMEN: Bowel sounds normal nontender  No guard or rebound, no hepato splenomegal no CVA tenderness.  No hernia. Extremtities:  No clubbing cyanosis or edema, no acute joint swelling or redness no focal atrophy NEURO:  Oriented x3, cranial nerves 3-12 appear to be intact, no obvious focal weakness,gait within normal limits no abnormal reflexes or asymmetrical SKIN: No acute rashes normal turgor, color, no bruising or petechiae. PSYCH: Oriented, good eye contact, no obvious depression anxiety, cognition and judgment appear normal. LN: no cervical axillary inguinal adenopathy No noted deficits in memory, attention, and speech.   Lab Results  Component Value Date   WBC 4.5 06/14/2014   HGB 13.1  06/14/2014   HCT 40.5 06/14/2014   PLT 246.0 06/14/2014   GLUCOSE 75 06/14/2014   CHOL 225* 06/14/2014   TRIG 85.0 06/14/2014   HDL 61.60 06/14/2014   LDLDIRECT 173.5 06/13/2013   LDLCALC 146* 06/14/2014   ALT 18 06/14/2014   AST 23 06/14/2014   NA 142 06/14/2014   K 4.4 06/14/2014   CL 104 06/14/2014   CREATININE 1.0 06/14/2014   BUN 14 06/14/2014   CO2 24 06/14/2014   TSH 1.19 06/14/2014    ASSESSMENT AND PLAN:  Discussed the following assessment and plan:  Welcome to Medicare preventive visit - getting advance directives forms  utd uncertain consider dex next year   Visit for preventive health examination  Need for influenza vaccination - Plan: Flu Vaccine QUAD 36+ mos PF IM (  Fluarix Quad PF)  Need for vaccination with 13-polyvalent pneumococcal conjugate vaccine - Plan: Pneumococcal conjugate vaccine 13-valent  Mixed hyperlipidemia - has had se of statins  family tolerates crestor but  10 yr risk aca 2013 is only 5.6 % at this time  Medication management - refill cymbatal and premarin  Counseled regarding healthy nutrition, exercise, sleep, injury prevention, calcium vit d and healthy weight . ? Bone density   Patient Care Team: Burnis Medin, MD as PCP - General Mauri Pole, MD (Orthopedic Surgery) [facility] Myrtice Lauth, MD (Dermatology) Juanita Craver, MD as Attending Physician (Gastroenterology) Mauri Pole, MD as Consulting Physician (Orthopedic Surgery)  Patient Instructions  Continue lifestyle intervention healthy eating and exercise . Yearly flu vvaccine  mammo when due. Your ACA 10 year risk for vascular events is 5.6 % . Acceptable to stay off statin meds and  Continue lifestyle intervention healthy eating and exercise . Follow  Attend to advance directive documents.     Standley Brooking. Panosh M.D.

## 2014-07-04 ENCOUNTER — Encounter: Payer: Self-pay | Admitting: Internal Medicine

## 2014-07-04 ENCOUNTER — Ambulatory Visit (INDEPENDENT_AMBULATORY_CARE_PROVIDER_SITE_OTHER): Payer: Medicare Other | Admitting: Internal Medicine

## 2014-07-04 VITALS — BP 152/80 | Temp 98.7°F | Ht 64.0 in | Wt 151.0 lb

## 2014-07-04 DIAGNOSIS — Z79899 Other long term (current) drug therapy: Secondary | ICD-10-CM

## 2014-07-04 DIAGNOSIS — R030 Elevated blood-pressure reading, without diagnosis of hypertension: Secondary | ICD-10-CM

## 2014-07-04 DIAGNOSIS — F4329 Adjustment disorder with other symptoms: Secondary | ICD-10-CM

## 2014-07-04 MED ORDER — DULOXETINE HCL 30 MG PO CPEP: ORAL_CAPSULE | ORAL | Status: DC

## 2014-07-04 NOTE — Patient Instructions (Addendum)
Some of the stress and symptoms  may be  From your moms situation. Considering.  Counseling. For support .   Cymbalta wean: Take 60 alternating with 30 mg   For 1- 2 weeks then  down to 30 mg. Per day . After 2-3 weeks contact us for other directions.    May decrease to 20 mg  To go off.

## 2014-07-04 NOTE — Progress Notes (Signed)
Pre visit review using our clinic review tool, if applicable. No additional management support is needed unless otherwise documented below in the visit note.  Chief Complaint  Patient presents with  . Follow-up    HPI:  Patient Pamela [name]  comes in today for SDA for   problem evaluation. Although she reported doing well with her depression when she came in for her wellness visit a few weeks ago she feels that her medicine isn't working. Realized that was worrying about  A problem.  over a car that didn't work out right doesn't feel that happy.   Not ahndling stress real well. Was surprised that her reaction to a disappointing situation Has usedprozac  In the past ( wellbutrin not that good)   Would like to wean off medication first and then decide if other medication would be helpful.Marland Kitchen   Has been in and out of the hospital has health risks  Lives in [place].  ROS: See pertinent positives and negatives per HPI.no hopelessness her irritable bowel might be flaring up.blood pressure usually is normal been exercising as much.   Past Medical History  Diagnosis Date  . Vaginitis, atrophic     recurrent  . Depression   . Arthritis KNEES, HANDS, SHOULDERS  . Impingement syndrome of right shoulder   . Hyperlipidemia, mixed   . Hx of Clostridium difficile infection     Family History  Problem Relation Age of Onset  . Arthritis Sister      CPPD autoinmmune  . Hyperlipidemia      family hx  . Hypertension      family hx  . Sudden death      family hx  . Heart disease      family hx  . Multiple myeloma Father     deceased  . Stroke Mother 36    History   Social History  . Marital Status: Married    Spouse Name: N/A    Number of Children: N/A  . Years of Education: N/A   Occupational History  . retired    Social History Main Topics  . Smoking status: Never Smoker   . Smokeless tobacco: Never Used  . Alcohol Use: Yes     Comment: OCCASIONAL  . Drug Use: No  . Sexual  Activity: None   Other Topics Concern  . 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  Has smoke detector and wears seat belts.  No firearms. No excess sun exposure. Sees dentist regularly .   Dexa nl 2005 and 2008    Outpatient Encounter Prescriptions as of 07/04/2014  Medication Sig  . Cholecalciferol (D3-1000 PO) Take 1 capsule by mouth daily.  Marland Kitchen conjugated estrogens (PREMARIN) vaginal cream Use vaginally as directed  . DULoxetine (CYMBALTA) 60 MG capsule TAKE 1 CAPSULE (60 MG TOTAL) BY MOUTH DAILY.  . DULoxetine (CYMBALTA) 30 MG capsule Take daily as  directed for weaning    EXAM:  BP 152/80 mmHg  Temp(Src) 98.7 F (37.1 C) (Oral)  Ht '5\' 4"'  (1.626 m)  Wt 151 lb (68.493 kg)  BMI 25.91 kg/m2  Body mass index is 25.91 kg/(m^2).  GENERAL: vitals reviewed and listed above, alert, oriented, appears well hydrated and in no acute distress PSYCH: pleasant and cooperative, no obvious depression or anxiety nl speech  and t hought looks slightly worried   BP Readings from Last 3 Encounters:  07/04/14 152/80  06/21/14 130/76  06/20/13 122/80  150/90   ASSESSMENT AND PLAN:  Discussed the following assessment and plan:  Adjustment disorder with other symptom - wishes to go off Cymbalta discussed weaning advise counseling is helpful  Elevated blood pressure reading - usually in range monitor at home get back with [place] if elevated office visit  Medication management Uncertain how weaning the Cymbalta will help her but reasonable to do this we'll go slow call us when she gets down to 30 mg a day considering going to 20 and alternating. -Patient advised to return or notify health care team  if symptoms worsen ,persist or new concerns arise.  Patient Instructions  Some of the stress and symptoms  may be  From your moms situation. Considering.  Counseling. For support .    Cymbalta wean: Take 60 alternating with 30 mg   For 1- 2 weeks then  down to 30 mg. Per day . After 2-3 weeks contact us for other directions.    May decrease to 20 mg  To go off.     Standley Brooking. Panosh M.D.  Total visit 48mns > 50% spent counseling and coordinating care

## 2014-08-04 ENCOUNTER — Telehealth: Payer: Self-pay | Admitting: Internal Medicine

## 2014-08-04 NOTE — Telephone Encounter (Signed)
Pt states dr Regis Bill is weaning her off cymbalta   Pt states she doing fine on 30 mg  Pt has 7 tabs left. Should she continue to 30 mg every other day ot get a new rx for 20mg  and continue to decrease. Pt will needs a rx either way [facility]

## 2014-08-06 NOTE — Telephone Encounter (Signed)
rx cymbalta 20 mg  Take 1 po   Wean as directed . Disp 30 refill x 1   Tell her to take daily and then qod and wean

## 2014-08-08 MED ORDER — DULOXETINE HCL 20 MG PO CPEP: 20.0000 mg | ORAL_CAPSULE | Freq: Every day | ORAL | Status: DC

## 2014-08-08 NOTE — Telephone Encounter (Signed)
Patient notified to pick up at the pharmacy.  Sent by e-scribe.

## 2014-09-01 ENCOUNTER — Other Ambulatory Visit: Payer: Self-pay | Admitting: Internal Medicine

## 2015-02-15 ENCOUNTER — Encounter: Payer: Self-pay | Admitting: Internal Medicine

## 2015-02-15 ENCOUNTER — Ambulatory Visit (INDEPENDENT_AMBULATORY_CARE_PROVIDER_SITE_OTHER): Payer: Medicare Other | Admitting: Internal Medicine

## 2015-02-15 VITALS — BP 128/70 | Temp 98.5°F | Ht 64.0 in | Wt 154.7 lb

## 2015-02-15 DIAGNOSIS — R454 Irritability and anger: Secondary | ICD-10-CM | POA: Diagnosis not present

## 2015-02-15 DIAGNOSIS — F4329 Adjustment disorder with other symptoms: Secondary | ICD-10-CM

## 2015-02-15 NOTE — Patient Instructions (Signed)
Counseling as dicussed and get back about medication intevention as appropriate.

## 2015-02-15 NOTE — Progress Notes (Signed)
Pre visit review using our clinic review tool, if applicable. No additional management support is needed unless otherwise documented below in the visit note.  Chief Complaint  Patient presents with  . Anxiety    Has been anxious about making decisions.  Has a short temper.    HPI: Pamela [name]  64 y.o. comes in because she has had some difficulty with irritability upset saying anxiety and short temper recently. This surprised her and her husband advise she talked to me first.Weaned  cymbalta as wished last visit  Since then seems to have been okay until most recently feels sensitive cries at times Hx of prozac   Couldn't take  Wellbutrin prozac  . cymbalta  . Seem to help the issues at time 1 related to work one was related to the death of her father. She's been off medication at least 3 months. Doesn't know whether she would do better with counseling prefers not to take medicine if possible. ROS: See pertinent positives and negatives per HPI. Has seen counselors 2 in the past short-term remote didn't work out. Has sciatica the flares up which itched could she take. Past Medical History  Diagnosis Date  . Vaginitis, atrophic     recurrent  . Depression   . Arthritis KNEES, HANDS, SHOULDERS  . Impingement syndrome of right shoulder   . Hyperlipidemia, mixed   . Hx of Clostridium difficile infection     Family History  Problem Relation Age of Onset  . Arthritis Sister      CPPD autoinmmune  . Hyperlipidemia      family hx  . Hypertension      family hx  . Sudden death      family hx  . Heart disease      family hx  . Multiple myeloma Father     deceased  . Stroke Mother 65    History   Social History  . Marital Status: Married    Spouse Name: N/A  . Number of Children: N/A  . Years of Education: N/A   Occupational History  . retired    Social History Main Topics  . Smoking status: Never Smoker   . Smokeless tobacco: Never Used  . Alcohol Use: Yes   Comment: OCCASIONAL  . Drug Use: No  . Sexual Activity: Not on file   Other Topics Concern  . 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  Has smoke detector and wears seat belts.  No firearms. No excess sun exposure. Sees dentist regularly .   Dexa nl 2005 and 2008    Outpatient Prescriptions Prior to Visit  Medication Sig Dispense Refill  . Cholecalciferol (D3-1000 PO) Take 1 capsule by mouth daily.    Marland Kitchen conjugated estrogens (PREMARIN) vaginal cream Use vaginally as directed 42.5 g 11  . DULoxetine (CYMBALTA) 20 MG capsule TAKE 1 CAPSULE (20 MG TOTAL) BY MOUTH DAILY. WEAN AS DIRECTED 30 capsule 3   No facility-administered medications prior to visit.     EXAM:  BP 128/70 mmHg  Temp(Src) 98.5 F (36.9 C) (Oral)  Ht '5\' 4"'  (1.626 m)  Wt 154 lb 11.2 oz (70.171 kg)  BMI 26.54 kg/m2  Body mass index is 26.54 kg/(m^2).  GENERAL: vitals reviewed and listed above, alert, oriented, appears well hydrated and in  no acute distress PSYCH: pleasant and cooperative, cognition appears intact  ASSESSMENT AND PLAN:  Discussed the following assessment and plan:  Irritable  Adjustment disorder with other symptom Poss some obsessive sx it appears  that although she was placed on Prozac and Cymbalta  In the past for different reasons that she was not having irritability or problems on these medications. She prefers to not take medicine and she has been able to identify trigger issues. Advise counseling cognitive therapy or emotional behavioral therapy and to try counseling about expectant management compact back with me about medication as needed. Or if  counselor deems it appropriate. HO pamphlet given to make appt .  Sciatica  Hx  Can do aleve bid for a week and then as needed .  -Patient advised to return or notify health care team  if symptoms worsen ,persist or new concerns  arise.  Patient Instructions  Counseling as dicussed and get back about medication intevention as appropriate.   Standley Brooking. Kyleena Scheirer M.D.

## 2015-05-28 ENCOUNTER — Other Ambulatory Visit: Payer: Self-pay

## 2015-05-28 DIAGNOSIS — Z1231 Encounter for screening mammogram for malignant neoplasm of breast: Secondary | ICD-10-CM

## 2015-06-08 ENCOUNTER — Ambulatory Visit
Admission: RE | Admit: 2015-06-08 | Discharge: 2015-06-08 | Disposition: A | Discharge status: Home / Self Care | Payer: Medicare Other | Source: Ambulatory Visit

## 2015-06-08 DIAGNOSIS — Z1231 Encounter for screening mammogram for malignant neoplasm of breast: Secondary | ICD-10-CM

## 2015-06-19 ENCOUNTER — Other Ambulatory Visit (INDEPENDENT_AMBULATORY_CARE_PROVIDER_SITE_OTHER): Payer: Medicare Other

## 2015-06-19 DIAGNOSIS — E785 Hyperlipidemia, unspecified: Secondary | ICD-10-CM | POA: Diagnosis not present

## 2015-06-19 DIAGNOSIS — Z0000 Encounter for general adult medical examination without abnormal findings: Secondary | ICD-10-CM

## 2015-06-19 LAB — LIPID PANEL
CHOL/HDL RATIO: 4
Cholesterol: 184 mg/dL (ref 0–200)
HDL: 47.6 mg/dL (ref >39.00)
LDL Cholesterol: 113 mg/dL — ABNORMAL HIGH (ref 0–99)
NONHDL: 136.5
TRIGLYCERIDES: 116 mg/dL (ref 0.0–149.0)
VLDL: 23.2 mg/dL (ref 0.0–40.0)

## 2015-06-19 LAB — CBC WITH DIFFERENTIAL/PLATELET
Basophils Absolute: 0 10*3/uL (ref 0.0–0.1)
Basophils Relative: 0.9 % (ref 0.0–3.0)
EOS PCT: 1.4 % (ref 0.0–5.0)
Eosinophils Absolute: 0.1 10*3/uL (ref 0.0–0.7)
HEMATOCRIT: 35 % — AB (ref 36.0–46.0)
HEMOGLOBIN: 11.9 g/dL — AB (ref 12.0–15.0)
LYMPHS PCT: 33.4 % (ref 12.0–46.0)
Lymphs Abs: 1.5 10*3/uL (ref 0.7–4.0)
MCHC: 34 g/dL (ref 30.0–36.0)
MCV: 81.6 fl (ref 78.0–100.0)
MONOS PCT: 11.2 % (ref 3.0–12.0)
Monocytes Absolute: 0.5 10*3/uL (ref 0.1–1.0)
NEUTROS ABS: 2.4 10*3/uL (ref 1.4–7.7)
Neutrophils Relative %: 53.1 % (ref 43.0–77.0)
PLATELETS: 232 10*3/uL (ref 150.0–400.0)
RBC: 4.29 Mil/uL (ref 3.87–5.11)
RDW: 13.6 % (ref 11.5–15.5)
WBC: 4.6 10*3/uL (ref 4.0–10.5)

## 2015-06-19 LAB — HEPATIC FUNCTION PANEL
ALBUMIN: 3.9 g/dL (ref 3.5–5.2)
ALT: 16 U/L (ref 0–35)
AST: 17 U/L (ref 0–37)
Alkaline Phosphatase: 62 U/L (ref 39–117)
Bilirubin, Direct: 0 mg/dL (ref 0.0–0.3)
TOTAL PROTEIN: 6.3 g/dL (ref 6.0–8.3)
Total Bilirubin: 0.3 mg/dL (ref 0.2–1.2)

## 2015-06-19 LAB — BASIC METABOLIC PANEL
BUN: 14 mg/dL (ref 6–23)
CHLORIDE: 107 meq/L (ref 96–112)
CO2: 27 meq/L (ref 19–32)
CREATININE: 0.82 mg/dL (ref 0.40–1.20)
Calcium: 8.8 mg/dL (ref 8.4–10.5)
GFR: 74.1 mL/min (ref >60.00)
Glucose, Bld: 108 mg/dL — ABNORMAL HIGH (ref 70–99)
POTASSIUM: 3.8 meq/L (ref 3.5–5.1)
SODIUM: 141 meq/L (ref 135–145)

## 2015-06-19 LAB — TSH: TSH: 1.24 u[IU]/mL (ref 0.35–4.50)

## 2015-06-26 ENCOUNTER — Ambulatory Visit (INDEPENDENT_AMBULATORY_CARE_PROVIDER_SITE_OTHER): Payer: Medicare Other | Admitting: Internal Medicine

## 2015-06-26 ENCOUNTER — Encounter: Payer: Self-pay | Admitting: Internal Medicine

## 2015-06-26 VITALS — BP 110/70 | Temp 99.0°F | Ht 64.0 in | Wt 154.5 lb

## 2015-06-26 DIAGNOSIS — E785 Hyperlipidemia, unspecified: Secondary | ICD-10-CM

## 2015-06-26 DIAGNOSIS — Z23 Encounter for immunization: Secondary | ICD-10-CM

## 2015-06-26 DIAGNOSIS — Z0000 Encounter for general adult medical examination without abnormal findings: Secondary | ICD-10-CM | POA: Diagnosis not present

## 2015-06-26 LAB — POCT URINALYSIS DIP (MANUAL ENTRY)
BILIRUBIN UA: NEGATIVE
Bilirubin, UA: NEGATIVE
Blood, UA: NEGATIVE
Glucose, UA: NEGATIVE
LEUKOCYTES UA: NEGATIVE
Nitrite, UA: NEGATIVE
PH UA: 7
PROTEIN UA: NEGATIVE
SPEC GRAV UA: 1.01
UROBILINOGEN UA: 0.2

## 2015-06-26 NOTE — Progress Notes (Signed)
Pre visit review using our clinic review tool, if applicable. No additional management support is needed unless otherwise documented below in the visit note.  Chief Complaint  Patient presents with  . Medicare Wellness    HPI: Pamela [name] 64 y.o. comes in today for Preventive Medicare wellness visit .  And medication disease management.  Had instant oatmeal and emergency packets,  Last dexa   2008 nl risk  No fx  Sees dr Collene Mares to have colon nov  Had a gi but  Has labs from her also  Mood good  Without meds  lsi   Health Maintenance  Topic Date Due  . Hepatitis C Screening  [birth year]  . DEXA SCAN  05/04/2014  . INFLUENZA VACCINE  03/25/2016  . MAMMOGRAM  06/07/2017  . TETANUS/TDAP  07/05/2017  . COLONOSCOPY  12/07/2018  . ZOSTAVAX  Completed  . PNA vac Low Risk Adult  Completed   Health Maintenance Review LIFESTYLE:  TAD no social etoh.  Sugar beverages: rare  Sleep: 6-7   Exercises  recenetly     MEDICARE DOCUMENT QUESTIONS  TO SCAN   Hearing:  Ok   Vision:  No limitations at present . Last eye check UTD  Safety:  Has smoke detector and wears seat belts.  No firearms. No excess sun exposure. Sees dentist regularly.  Falls: n  Advance directive :  Reviewed  Has one. 5 wishes   Memory: Felt to be good  , no concern from her or her family.  Depression: No anhedonia unusual crying or depressive symptoms  Doing better  Self management   Nutrition: Eats well balanced diet; adequate calcium and vitamin D. No swallowing chewing problems.  Injury: no major injuries in the last six months.  Other healthcare providers:  Reviewed today .  Social:  Lives with spouse married. No pets.   Preventive parameters: up-to-date  Reviewed   ADLS:   There are no problems or need for assistance  driving, feeding, obtaining food, dressing, toileting and bathing, managing money using phone. She is independent.   ROS:  GEN/ HEENT: No fever, significant weight changes sweats  headaches vision problems hearing changes, CV/ PULM; No chest pain shortness of breath cough, syncope,edema  change in exercise tolerance. GI /GU: No adominal pain, vomiting, change in bowel habits. No blood in the stool. No significant GU symptoms. SKIN/HEME: ,no acute skin rashes suspicious lesions or bleeding. No lymphadenopathy, nodules, masses.  NEURO/ PSYCH:  No neurologic signs such as weakness numbness. No depression anxiety. IMM/ Allergy: No unusual infections.  Allergy .   REST of 12 system review negative except as per HPI   Past Medical History  Diagnosis Date  . Vaginitis, atrophic     recurrent  . Depression   . Arthritis KNEES, HANDS, SHOULDERS  . Impingement syndrome of right shoulder   . Hyperlipidemia, mixed   . Hx of Clostridium difficile infection   . Skin cancer     Tonia Brooms     Family History  Problem Relation Age of Onset  . Arthritis Sister      CPPD autoinmmune  . Hyperlipidemia      family hx  . Hypertension      family hx  . Sudden death      family hx  . Heart disease      family hx  . Multiple myeloma Father     deceased  . Stroke Mother 49    Social History   Social History  .  Marital Status: Married    Spouse Name: N/A  . Number of Children: N/A  . Years of Education: N/A   Occupational History  . retired    Social History Main Topics  . Smoking status: Never Smoker   . Smokeless tobacco: Never Used  . Alcohol Use: Yes     Comment: OCCASIONAL  . Drug Use: No  . Sexual Activity: Not Asked   Other Topics Concern  . 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  Has smoke detector and wears seat belts.  No firearms. No excess sun exposure. Sees dentist regularly .   Dexa nl 2005 and 2008    Outpatient Encounter Prescriptions as of 06/26/2015  Medication Sig  . aspirin 500 MG tablet Take 500 mg by mouth as needed for  pain. Uses for Arthritis  . Cholecalciferol (D3-1000 PO) Take 1 capsule by mouth daily.  Marland Kitchen conjugated estrogens (PREMARIN) vaginal cream Use vaginally as directed  . Probiotic Product (PROBIOTIC PO) Take 1 capsule by mouth daily.   No facility-administered encounter medications on file as of 06/26/2015.    EXAM:  BP 110/70 mmHg  Temp(Src) 99 F (37.2 C) (Oral)  Ht _0  (1.626 m)  Wt 154 lb 8 oz (70.081 kg)  BMI 26.51 kg/m2  Body mass index is 26.51 kg/(m^2).  Physical Exam: Vital signs reviewed HQP:RFFM is a well-developed well-nourished alert cooperative   who appears stated age in no acute distress.  HEENT: normocephalic atraumatic , Eyes: PERRL EOM's full, conjunctiva clear, Nares: paten,t no deformity discharge or tenderness., Ears: no deformity EAC's clear TMs with normal landmarks. Mouth: clear OP, no lesions, edema.  Moist mucous membranes. Dentition in adequate repair. NECK: supple without masses, thyromegaly or bruits. CHEST/PULM:  Clear to auscultation and percussion breath sounds equal no wheeze , rales or rhonchi. No chest wall deformities or tenderness. CV: PMI is nondisplaced, S1 S2 no gallops, murmurs, rubs. Peripheral pulses are full without delay.No JVD Breast: normal by inspection . No dimpling, discharge, masses, tenderness or discharge .Marland Kitchen  ABDOMEN: Bowel sounds normal nontender  No guard or rebound, no hepato splenomegal no CVA tenderness.   Extremtities:  No clubbing cyanosis or edema, no acute joint swelling or redness no focal atrophy NEURO:  Oriented x3, cranial nerves 3-12 appear to be intact, no obvious focal weakness,gait within normal limits no abnormal reflexes or asymmetrical SKIN: No acute rashes normal turgor, color, no bruising or petechiae. PSYCH: Oriented, good eye contact, no obvious depression anxiety, cognition and judgment appear normal. LN: no cervical axillary inguinal adenopathy No noted deficits in memory, attention, and speech.   Lab  Results  Component Value Date   WBC 4.6 06/19/2015   HGB 11.9* 06/19/2015   HCT 35.0* 06/19/2015   PLT 232.0 06/19/2015   GLUCOSE 108* 06/19/2015   CHOL 184 06/19/2015   TRIG 116.0 06/19/2015   HDL 47.60 06/19/2015   LDLDIRECT 173.5 06/13/2013   LDLCALC 113* 06/19/2015   ALT 16 06/19/2015   AST 17 06/19/2015   NA 141 06/19/2015   K 3.8 06/19/2015   CL 107 06/19/2015   CREATININE 0.82 06/19/2015   BUN 14 06/19/2015   CO2 27 06/19/2015   TSH 1.24 06/19/2015   Wt Readings from Last 3 Encounters:  06/26/15 154 lb 8 oz (70.081 kg)  02/15/15 154 lb 11.2 oz (70.171 kg)  07/04/14 151 lb (68.493 kg)    ASSESSMENT AND PLAN:  Discussed the following assessment and plan:  Visit for preventive health examination - Plan: POCT urinalysis dipstick  Need for prophylactic vaccination and inoculation against influenza - Plan: Flu vaccine HIGH DOSE PF (Fluzone High dose)  Need for 23-polyvalent pneumococcal polysaccharide vaccine - Plan: Pneumococcal polysaccharide vaccine 23-valent greater than or equal to 2yo subcutaneous/IM  HLD (hyperlipidemia) - better    lsi  Borderline hg getting colonscopy in NOV dr Collene Mares  (bg was non fasting had oatmeal and other ) lipids are so much better with LSI  eercise and dietary changes   Mood better with lsi  consider dexa next year not high risk last 2008 Patient Care Team: Burnis Medin, MD as PCP - General Paralee Cancel, MD (Orthopedic Surgery) Crista Luria, MD (Dermatology) Juanita Craver, MD as Attending Physician (Gastroenterology) Paralee Cancel, MD as Consulting Physician (Orthopedic Surgery)  Patient Instructions  Continue lifestyle intervention healthy eating and exercise . Yearly flu vaccine    Glad  You are doing well    .    Health Maintenance, Female Adopting a healthy lifestyle and getting preventive care can go a long way to promote health and wellness. Talk with your health care provider about what schedule of regular examinations is  right for you. This is a good chance for you to check in with your provider about disease prevention and staying healthy. In between checkups, there are plenty of things you can do on your own. Experts have done a lot of research about which lifestyle changes and preventive measures are most likely to keep you healthy. Ask your health care provider for more information. WEIGHT AND DIET  Eat a healthy diet  Be sure to include plenty of vegetables, fruits, low-fat dairy products, and lean protein.  Do not eat a lot of foods high in solid fats, added sugars, or salt.  Get regular exercise. This is one of the most important things you can do for your health.  Most adults should exercise for at least 150 minutes each week. The exercise should increase your heart rate and make you sweat (moderate-intensity exercise).  Most adults should also do strengthening exercises at least twice a week. This is in addition to the moderate-intensity exercise.  Maintain a healthy weight  Body mass index (BMI) is a measurement that can be used to identify possible weight problems. It estimates body fat based on height and weight. Your health care provider can help determine your BMI and help you achieve or maintain a healthy weight.  For females 5 years of age and older:   A BMI below 18.5 is considered underweight.  A BMI of 18.5 to 24.9 is normal.  A BMI of 25 to 29.9 is considered overweight.  A BMI of 30 and above is considered obese.  Watch levels of cholesterol and blood lipids  You should start having your blood tested for lipids and cholesterol at 66 years of age, then have this test every 5 years.  You may need to have your cholesterol levels checked more often if:  Your lipid or cholesterol levels are high.  You are older than 66 years of age.  You are at high risk for heart disease.  CANCER SCREENING   Lung Cancer  Lung cancer screening is recommended for adults 60-36 years old  who are at high risk for lung cancer because of a history of  smoking.  A yearly low-dose CT scan of the lungs is recommended for people who:  Currently smoke.  Have quit within the past 15 years.  Have at least a 30-pack-year history of smoking. A pack year is smoking an average of one pack of cigarettes a day for 1 year.  Yearly screening should continue until it has been 15 years since you quit.  Yearly screening should stop if you develop a health problem that would prevent you from having lung cancer treatment.  Breast Cancer  Practice breast self-awareness. This means understanding how your breasts normally appear and feel.  It also means doing regular breast self-exams. Let your health care provider know about any changes, no matter how small.  If you are in your 20s or 30s, you should have a clinical breast exam (CBE) by a health care provider every 1-3 years as part of a regular health exam.  If you are 37 or older, have a CBE every year. Also consider having a breast X-ray (mammogram) every year.  If you have a family history of breast cancer, talk to your health care provider about genetic screening.  If you are at high risk for breast cancer, talk to your health care provider about having an MRI and a mammogram every year.  Breast cancer gene (BRCA) assessment is recommended for women who have family members with BRCA-related cancers. BRCA-related cancers include:  Breast.  Ovarian.  Tubal.  Peritoneal cancers.  Results of the assessment will determine the need for genetic counseling and BRCA1 and BRCA2 testing. Cervical Cancer Your health care provider may recommend that you be screened regularly for cancer of the pelvic organs (ovaries, uterus, and vagina). This screening involves a pelvic examination, including checking for microscopic changes to the surface of your cervix (Pap test). You may be encouraged to have this screening done every 3 years, beginning at  age 35.  For women ages 2-65, health care providers may recommend pelvic exams and Pap testing every 3 years, or they may recommend the Pap and pelvic exam, combined with testing for human papilloma virus (HPV), every 5 years. Some types of HPV increase your risk of cervical cancer. Testing for HPV may also be done on women of any age with unclear Pap test results.  Other health care providers may not recommend any screening for nonpregnant women who are considered low risk for pelvic cancer and who do not have symptoms. Ask your health care provider if a screening pelvic exam is right for you.  If you have had past treatment for cervical cancer or a condition that could lead to cancer, you need Pap tests and screening for cancer for at least 20 years after your treatment. If Pap tests have been discontinued, your risk factors (such as having a new sexual partner) need to be reassessed to determine if screening should resume. Some women have medical problems that increase the chance of getting cervical cancer. In these cases, your health care provider may recommend more frequent screening and Pap tests. Colorectal Cancer  This type of cancer can be detected and often prevented.  Routine colorectal cancer screening usually begins at 66 years of age and continues through 66 years of age.  Your health care provider may recommend screening at an earlier age if you have risk factors for colon cancer.  Your health care provider may also recommend using home test kits to check for hidden blood in the stool.  A small camera at the end of  a tube can be used to examine your colon directly (sigmoidoscopy or colonoscopy). This is done to check for the earliest forms of colorectal cancer.  Routine screening usually begins at age 67.  Direct examination of the colon should be repeated every 5-10 years through 66 years of age. However, you may need to be screened more often if early forms of precancerous  polyps or small growths are found. Skin Cancer  Check your skin from head to toe regularly.  Tell your health care provider about any new moles or changes in moles, especially if there is a change in a mole's shape or color.  Also tell your health care provider if you have a mole that is larger than the size of a pencil eraser.  Always use sunscreen. Apply sunscreen liberally and repeatedly throughout the day.  Protect yourself by wearing long sleeves, pants, a wide-brimmed hat, and sunglasses whenever you are outside. HEART DISEASE, DIABETES, AND HIGH BLOOD PRESSURE   High blood pressure causes heart disease and increases the risk of stroke. High blood pressure is more likely to develop in:  People who have blood pressure in the high end of the normal range (130-139/85-89 mm Hg).  People who are overweight or obese.  People who are African American.  If you are 63-29 years of age, have your blood pressure checked every 3-5 years. If you are 31 years of age or older, have your blood pressure checked every year. You should have your blood pressure measured twice--once when you are at a hospital or clinic, and once when you are not at a hospital or clinic. Record the average of the two measurements. To check your blood pressure when you are not at a hospital or clinic, you can use:  An automated blood pressure machine at a pharmacy.  A home blood pressure monitor.  If you are between 1 years and 92 years old, ask your health care provider if you should take aspirin to prevent strokes.  Have regular diabetes screenings. This involves taking a blood sample to check your fasting blood sugar level.  If you are at a normal weight and have a low risk for diabetes, have this test once every three years after 66 years of age.  If you are overweight and have a high risk for diabetes, consider being tested at a younger age or more often. PREVENTING INFECTION  Hepatitis B  If you have a  higher risk for hepatitis B, you should be screened for this virus. You are considered at high risk for hepatitis B if:  You were born in a country where hepatitis B is common. Ask your health care provider which countries are considered high risk.  Your parents were born in a high-risk country, and you have not been immunized against hepatitis B (hepatitis B vaccine).  You have HIV or AIDS.  You use needles to inject street drugs.  You live with someone who has hepatitis B.  You have had sex with someone who has hepatitis B.  You get hemodialysis treatment.  You take certain medicines for conditions, including cancer, organ transplantation, and autoimmune conditions. Hepatitis C  Blood testing is recommended for:  Everyone born from [birth year] through 1967.  Anyone with known risk factors for hepatitis C. Sexually transmitted infections (STIs)  You should be screened for sexually transmitted infections (STIs) including gonorrhea and chlamydia if:  You are sexually active and are younger than 66 years of age.  You are older than 66  years of age and your health care provider tells you that you are at risk for this type of infection.  Your sexual activity has changed since you were last screened and you are at an increased risk for chlamydia or gonorrhea. Ask your health care provider if you are at risk.  If you do not have HIV, but are at risk, it may be recommended that you take a prescription medicine daily to prevent HIV infection. This is called pre-exposure prophylaxis (PrEP). You are considered at risk if:  You are sexually active and do not regularly use condoms or know the HIV status of your partner(s).  You take drugs by injection.  You are sexually active with a partner who has HIV. Talk with your health care provider about whether you are at high risk of being infected with HIV. If you choose to begin PrEP, you should first be tested for HIV. You should then be tested  every 3 months for as long as you are taking PrEP.  PREGNANCY   If you are premenopausal and you may become pregnant, ask your health care provider about preconception counseling.  If you may become pregnant, take 400 to 800 micrograms (mcg) of folic acid every day.  If you want to prevent pregnancy, talk to your health care provider about birth control (contraception). OSTEOPOROSIS AND MENOPAUSE   Osteoporosis is a disease in which the bones lose minerals and strength with aging. This can result in serious bone fractures. Your risk for osteoporosis can be identified using a bone density scan.  If you are 55 years of age or older, or if you are at risk for osteoporosis and fractures, ask your health care provider if you should be screened.  Ask your health care provider whether you should take a calcium or vitamin D supplement to lower your risk for osteoporosis.  Menopause may have certain physical symptoms and risks.  Hormone replacement therapy may reduce some of these symptoms and risks. Talk to your health care provider about whether hormone replacement therapy is right for you.  HOME CARE INSTRUCTIONS   Schedule regular health, dental, and eye exams.  Stay current with your immunizations.   Do not use any tobacco products including cigarettes, chewing tobacco, or electronic cigarettes.  If you are pregnant, do not drink alcohol.  If you are breastfeeding, limit how much and how often you drink alcohol.  Limit alcohol intake to no more than 1 drink per day for nonpregnant women. One drink equals 12 ounces of beer, 5 ounces of wine, or 1 ounces of hard liquor.  Do not use street drugs.  Do not share needles.  Ask your health care provider for help if you need support or information about quitting drugs.  Tell your health care provider if you often feel depressed.  Tell your health care provider if you have ever been abused or do not feel safe at home.   This  information is not intended to replace advice given to you by your health care provider. Make sure you discuss any questions you have with your health care provider.   Document Released: 02/24/2011 Document Revised: 09/01/2014 Document Reviewed: 07/13/2013 Elsevier Interactive Patient Education 2016 [place] K. Jessaca Philippi M.D.

## 2015-06-26 NOTE — Patient Instructions (Addendum)
Continue lifestyle intervention healthy eating and exercise . Yearly flu vaccine    Glad  You are doing well    .    Health Maintenance, Female Adopting a healthy lifestyle and getting preventive care can go a long way to promote health and wellness. Talk with your health care provider about what schedule of regular examinations is right for you. This is a good chance for you to check in with your provider about disease prevention and staying healthy. In between checkups, there are plenty of things you can do on your own. Experts have done a lot of research about which lifestyle changes and preventive measures are most likely to keep you healthy. Ask your health care provider for more information. WEIGHT AND DIET  Eat a healthy diet  Be sure to include plenty of vegetables, fruits, low-fat dairy products, and lean protein.  Do not eat a lot of foods high in solid fats, added sugars, or salt.  Get regular exercise. This is one of the most important things you can do for your health.  Most adults should exercise for at least 150 minutes each week. The exercise should increase your heart rate and make you sweat (moderate-intensity exercise).  Most adults should also do strengthening exercises at least twice a week. This is in addition to the moderate-intensity exercise.  Maintain a healthy weight  Body mass index (BMI) is a measurement that can be used to identify possible weight problems. It estimates body fat based on height and weight. Your health care provider can help determine your BMI and help you achieve or maintain a healthy weight.  For females 9 years of age and older:   A BMI below 18.5 is considered underweight.  A BMI of 18.5 to 24.9 is normal.  A BMI of 25 to 29.9 is considered overweight.  A BMI of 30 and above is considered obese.  Watch levels of cholesterol and blood lipids  You should start having your blood tested for lipids and cholesterol at 66 years of  age, then have this test every 5 years.  You may need to have your cholesterol levels checked more often if:  Your lipid or cholesterol levels are high.  You are older than 66 years of age.  You are at high risk for heart disease.  CANCER SCREENING   Lung Cancer  Lung cancer screening is recommended for adults 43-68 years old who are at high risk for lung cancer because of a history of smoking.  A yearly low-dose CT scan of the lungs is recommended for people who:  Currently smoke.  Have quit within the past 15 years.  Have at least a 30-pack-year history of smoking. A pack year is smoking an average of one pack of cigarettes a day for 1 year.  Yearly screening should continue until it has been 15 years since you quit.  Yearly screening should stop if you develop a health problem that would prevent you from having lung cancer treatment.  Breast Cancer  Practice breast self-awareness. This means understanding how your breasts normally appear and feel.  It also means doing regular breast self-exams. Let your health care provider know about any changes, no matter how small.  If you are in your 20s or 30s, you should have a clinical breast exam (CBE) by a health care provider every 1-3 years as part of a regular health exam.  If you are 49 or older, have a CBE every year. Also consider having a  breast X-ray (mammogram) every year.  If you have a family history of breast cancer, talk to your health care provider about genetic screening.  If you are at high risk for breast cancer, talk to your health care provider about having an MRI and a mammogram every year.  Breast cancer gene (BRCA) assessment is recommended for women who have family members with BRCA-related cancers. BRCA-related cancers include:  Breast.  Ovarian.  Tubal.  Peritoneal cancers.  Results of the assessment will determine the need for genetic counseling and BRCA1 and BRCA2 testing. Cervical  Cancer Your health care provider may recommend that you be screened regularly for cancer of the pelvic organs (ovaries, uterus, and vagina). This screening involves a pelvic examination, including checking for microscopic changes to the surface of your cervix (Pap test). You may be encouraged to have this screening done every 3 years, beginning at age 63.  For women ages 28-65, health care providers may recommend pelvic exams and Pap testing every 3 years, or they may recommend the Pap and pelvic exam, combined with testing for human papilloma virus (HPV), every 5 years. Some types of HPV increase your risk of cervical cancer. Testing for HPV may also be done on women of any age with unclear Pap test results.  Other health care providers may not recommend any screening for nonpregnant women who are considered low risk for pelvic cancer and who do not have symptoms. Ask your health care provider if a screening pelvic exam is right for you.  If you have had past treatment for cervical cancer or a condition that could lead to cancer, you need Pap tests and screening for cancer for at least 20 years after your treatment. If Pap tests have been discontinued, your risk factors (such as having a new sexual partner) need to be reassessed to determine if screening should resume. Some women have medical problems that increase the chance of getting cervical cancer. In these cases, your health care provider may recommend more frequent screening and Pap tests. Colorectal Cancer  This type of cancer can be detected and often prevented.  Routine colorectal cancer screening usually begins at 66 years of age and continues through 66 years of age.  Your health care provider may recommend screening at an earlier age if you have risk factors for colon cancer.  Your health care provider may also recommend using home test kits to check for hidden blood in the stool.  A small camera at the end of a tube can be used to  examine your colon directly (sigmoidoscopy or colonoscopy). This is done to check for the earliest forms of colorectal cancer.  Routine screening usually begins at age 36.  Direct examination of the colon should be repeated every 5-10 years through 66 years of age. However, you may need to be screened more often if early forms of precancerous polyps or small growths are found. Skin Cancer  Check your skin from head to toe regularly.  Tell your health care provider about any new moles or changes in moles, especially if there is a change in a mole's shape or color.  Also tell your health care provider if you have a mole that is larger than the size of a pencil eraser.  Always use sunscreen. Apply sunscreen liberally and repeatedly throughout the day.  Protect yourself by wearing long sleeves, pants, a wide-brimmed hat, and sunglasses whenever you are outside. HEART DISEASE, DIABETES, AND HIGH BLOOD PRESSURE   High blood pressure causes  heart disease and increases the risk of stroke. High blood pressure is more likely to develop in:  People who have blood pressure in the high end of the normal range (130-139/85-89 mm Hg).  People who are overweight or obese.  People who are African American.  If you are 38-75 years of age, have your blood pressure checked every 3-5 years. If you are 30 years of age or older, have your blood pressure checked every year. You should have your blood pressure measured twice--once when you are at a hospital or clinic, and once when you are not at a hospital or clinic. Record the average of the two measurements. To check your blood pressure when you are not at a hospital or clinic, you can use:  An automated blood pressure machine at a pharmacy.  A home blood pressure monitor.  If you are between 39 years and 69 years old, ask your health care provider if you should take aspirin to prevent strokes.  Have regular diabetes screenings. This involves taking a  blood sample to check your fasting blood sugar level.  If you are at a normal weight and have a low risk for diabetes, have this test once every three years after 66 years of age.  If you are overweight and have a high risk for diabetes, consider being tested at a younger age or more often. PREVENTING INFECTION  Hepatitis B  If you have a higher risk for hepatitis B, you should be screened for this virus. You are considered at high risk for hepatitis B if:  You were born in a country where hepatitis B is common. Ask your health care provider which countries are considered high risk.  Your parents were born in a high-risk country, and you have not been immunized against hepatitis B (hepatitis B vaccine).  You have HIV or AIDS.  You use needles to inject street drugs.  You live with someone who has hepatitis B.  You have had sex with someone who has hepatitis B.  You get hemodialysis treatment.  You take certain medicines for conditions, including cancer, organ transplantation, and autoimmune conditions. Hepatitis C  Blood testing is recommended for:  Everyone born from [birth year] through 1967.  Anyone with known risk factors for hepatitis C. Sexually transmitted infections (STIs)  You should be screened for sexually transmitted infections (STIs) including gonorrhea and chlamydia if:  You are sexually active and are younger than 66 years of age.  You are older than 66 years of age and your health care provider tells you that you are at risk for this type of infection.  Your sexual activity has changed since you were last screened and you are at an increased risk for chlamydia or gonorrhea. Ask your health care provider if you are at risk.  If you do not have HIV, but are at risk, it may be recommended that you take a prescription medicine daily to prevent HIV infection. This is called pre-exposure prophylaxis (PrEP). You are considered at risk if:  You are sexually active and do  not regularly use condoms or know the HIV status of your partner(s).  You take drugs by injection.  You are sexually active with a partner who has HIV. Talk with your health care provider about whether you are at high risk of being infected with HIV. If you choose to begin PrEP, you should first be tested for HIV. You should then be tested every 3 months for as long as you are  taking PrEP.  PREGNANCY   If you are premenopausal and you may become pregnant, ask your health care provider about preconception counseling.  If you may become pregnant, take 400 to 800 micrograms (mcg) of folic acid every day.  If you want to prevent pregnancy, talk to your health care provider about birth control (contraception). OSTEOPOROSIS AND MENOPAUSE   Osteoporosis is a disease in which the bones lose minerals and strength with aging. This can result in serious bone fractures. Your risk for osteoporosis can be identified using a bone density scan.  If you are 4 years of age or older, or if you are at risk for osteoporosis and fractures, ask your health care provider if you should be screened.  Ask your health care provider whether you should take a calcium or vitamin D supplement to lower your risk for osteoporosis.  Menopause may have certain physical symptoms and risks.  Hormone replacement therapy may reduce some of these symptoms and risks. Talk to your health care provider about whether hormone replacement therapy is right for you.  HOME CARE INSTRUCTIONS   Schedule regular health, dental, and eye exams.  Stay current with your immunizations.   Do not use any tobacco products including cigarettes, chewing tobacco, or electronic cigarettes.  If you are pregnant, do not drink alcohol.  If you are breastfeeding, limit how much and how often you drink alcohol.  Limit alcohol intake to no more than 1 drink per day for nonpregnant women. One drink equals 12 ounces of beer, 5 ounces of wine, or 1  ounces of hard liquor.  Do not use street drugs.  Do not share needles.  Ask your health care provider for help if you need support or information about quitting drugs.  Tell your health care provider if you often feel depressed.  Tell your health care provider if you have ever been abused or do not feel safe at home.   This information is not intended to replace advice given to you by your health care provider. Make sure you discuss any questions you have with your health care provider.   Document Released: 02/24/2011 Document Revised: 09/01/2014 Document Reviewed: 07/13/2013 Elsevier Interactive Patient Education [employer].

## 2015-07-23 LAB — HM COLONOSCOPY

## 2015-08-06 ENCOUNTER — Ambulatory Visit (INDEPENDENT_AMBULATORY_CARE_PROVIDER_SITE_OTHER): Payer: Medicare Other | Admitting: Family Medicine

## 2015-08-06 ENCOUNTER — Encounter: Payer: Self-pay | Admitting: Family Medicine

## 2015-08-06 VITALS — BP 120/62 | HR 99 | Temp 98.6°F | Ht 64.0 in | Wt 154.9 lb

## 2015-08-06 DIAGNOSIS — J069 Acute upper respiratory infection, unspecified: Secondary | ICD-10-CM | POA: Diagnosis not present

## 2015-08-06 NOTE — Progress Notes (Signed)
HPI:  -started: about 6 days ago, now better today  -symptoms:nasal congestion, sore throat, cough, temp of 99 a few times -denies:fever >99, SOB, NVD, tooth pain -has tried: OTC options -sick contacts/travel/risks: denies flu exposure, tick exposure or or Ebola risks  ROS: See pertinent positives and negatives per HPI.  Past Medical History  Diagnosis Date  . Vaginitis, atrophic     recurrent  . Depression   . Arthritis KNEES, HANDS, SHOULDERS  . Impingement syndrome of right shoulder   . Hyperlipidemia, mixed   . Hx of Clostridium difficile infection   . Skin cancer     Tonia Brooms     Past Surgical History  Procedure Laterality Date  . Carpal tunnel release    . Abdominal hysterectomy  1992    W/ BILATERAL SALPINGO-OOPHORECTOMY  . Tonsillectomy  AS CHILD  . Shoulder arthroscopy with subacromial decompression  08/06/2012    Procedure: SHOULDER ARTHROSCOPY WITH SUBACROMIAL DECOMPRESSION;  Surgeon: Magnus Sinning, MD;  Location: [place];  Service: Orthopedics;  Laterality: Right;  debridement of labral  . Shoulder arthroscopy with rotator cuff repair  08/06/2012    Procedure: SHOULDER ARTHROSCOPY WITH ROTATOR CUFF REPAIR;  Surgeon: Magnus Sinning, MD;  Location: [place];  Service: Orthopedics;  Laterality: Right;    Family History  Problem Relation Age of Onset  . Arthritis Sister      CPPD autoinmmune  . Hyperlipidemia      family hx  . Hypertension      family hx  . Sudden death      family hx  . Heart disease      family hx  . Multiple myeloma Father     deceased  . Stroke Mother 60    Social History   Social History  . Marital Status: Married    Spouse Name: N/A  . Number of Children: N/A  . Years of Education: N/A   Occupational History  . retired    Social History Main Topics  . Smoking status: Never Smoker   . Smokeless tobacco: Never Used  . Alcohol Use: Yes     Comment: OCCASIONAL  . Drug Use: No    . Sexual Activity: Not Asked   Other Topics Concern  . 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  Has smoke detector and wears seat belts.  No firearms. No excess sun exposure. Sees dentist regularly .   Dexa nl 2005 and 2008     Current outpatient prescriptions:  .  aspirin 500 MG tablet, Take 500 mg by mouth as needed for pain. Uses for Arthritis, Disp: , Rfl:  .  Cholecalciferol (D3-1000 PO), Take 1 capsule by mouth daily., Disp: , Rfl:  .  conjugated estrogens (PREMARIN) vaginal cream, Use vaginally as directed, Disp: 42.5 g, Rfl: 11 .  Probiotic Product (PROBIOTIC PO), Take 1 capsule by mouth daily., Disp: , Rfl:   EXAM:  Filed Vitals:   08/06/15 1346  BP: 120/62  Pulse: 99  Temp: 98.6 F (37 C)    Body mass index is 26.58 kg/(m^2).  GENERAL: vitals reviewed and listed above, alert, oriented, appears well hydrated and in no acute distress  HEENT: atraumatic, conjunttiva clear, no obvious abnormalities on inspection of external nose and ears, normal appearance of  ear canals and TMs, clear nasal congestion, mild post oropharyngeal erythema with PND, no tonsillar edema or exudate, no sinus TTP  NECK: no obvious masses on inspection  LUNGS: clear to auscultation bilaterally, no wheezes, rales or rhonchi, good air movement  CV: HRRR, no peripheral edema  MS: moves all extremities without noticeable abnormality  PSYCH: pleasant and cooperative, no obvious depression or anxiety  ASSESSMENT AND PLAN:  Discussed the following assessment and plan:  Acute upper respiratory infection  -given HPI and exam findings today, a serious infection or illness is unlikely. We discussed potential etiologies, with VURI being most likely, and advised supportive care and monitoring. We discussed treatment side effects, likely course, antibiotic misuse, transmission, and signs  of developing a serious illness. -of course, we advised to return or notify a doctor immediately if symptoms worsen or persist or new concerns arise.    Patient Instructions  INSTRUCTIONS FOR UPPER RESPIRATORY INFECTION:  -plenty of rest and fluids  -nasal saline wash 2-3 times daily (use prepackaged nasal saline or bottled/distilled water if making your own)   -can use AFRIN nasal spray for drainage and nasal congestion - but do NOT use longer then 3-4 days  -can use tylenol (in no history of liver disease) or ibuprofen (if no history of kidney disease, bowel bleeding or significant heart disease) as directed for aches and sorethroat  -in the winter time, using a humidifier at night is helpful (please follow cleaning instructions)  -if you are taking a cough medication - use only as directed, may also try a teaspoon of honey to coat the throat and throat lozenges. If given a cough medication with codeine or hydrocodone or other narcotic please be advised that this contains a strong and  potentially addicting medication. Please follow instructions carefully, take as little as possible and only use AS NEEDED for severe cough. Discuss potential side effects with your pharmacy. Please do not drive or operate machinery while taking these types of medications. Please do not take other sedating medications, drugs or alcohol while taking this medication without discussing with your doctor.  -for sore throat, salt water gargles can help  -follow up if you have fevers, facial pain, tooth pain, difficulty breathing or are worsening or symptoms persist longer then expected  Upper Respiratory Infection, Adult An upper respiratory infection (URI) is also known as the common cold. It is often caused by a type of germ (virus). Colds are easily spread (contagious). You can pass it to others by kissing, coughing, sneezing, or drinking out of the same glass. Usually, you get better in 1 to 3  weeks.  However,  the cough can last for even longer. HOME CARE   Only take medicine as told by your doctor. Follow instructions provided above.  Drink enough water and fluids to keep your pee (urine) clear or pale yellow.  Get plenty of rest.  Return to work when your temperature is < 100 for 24 hours or as told by your doctor. You may use a face mask and wash your hands to stop your cold from spreading. GET HELP RIGHT AWAY IF:   After the first few days, you feel you are getting worse.  You have questions about your medicine.  You have chills, shortness of breath, or red spit (mucus).  You have pain in the face for more then 1-2 days, especially when you bend forward.  You have a fever, puffy (swollen) neck, pain when you swallow, or white spots in the  back of your throat.  You have a bad headache, ear pain, sinus pain, or chest pain.  You have a high-pitched whistling sound when you breathe in and out (wheezing).  You cough up blood.  You have sore muscles or a stiff neck. MAKE SURE YOU:   Understand these instructions.  Will watch your condition.  Will get help right away if you are not doing well or get worse. Document Released: 01/28/2008 Document Revised: 11/03/2011 Document Reviewed: 11/16/2013 [facility] Patient Information 2015 [place], [place]. This information is not intended to replace advice given to you by your health care provider. Make sure you discuss any questions you have with your health care provider.      Colin Benton R.

## 2015-08-06 NOTE — Patient Instructions (Signed)

## 2015-08-06 NOTE — Progress Notes (Signed)
Pre visit review using our clinic review tool, if applicable. No additional management support is needed unless otherwise documented below in the visit note. 

## 2015-08-14 ENCOUNTER — Encounter: Payer: Self-pay | Admitting: Family Medicine

## 2015-09-10 ENCOUNTER — Encounter: Payer: Self-pay | Admitting: Internal Medicine

## 2015-09-10 ENCOUNTER — Other Ambulatory Visit: Payer: Self-pay | Admitting: Internal Medicine

## 2015-09-10 ENCOUNTER — Ambulatory Visit (INDEPENDENT_AMBULATORY_CARE_PROVIDER_SITE_OTHER): Payer: Medicare Other | Admitting: Internal Medicine

## 2015-09-10 VITALS — BP 150/72 | Temp 98.3°F | Ht 64.0 in | Wt 156.4 lb

## 2015-09-10 DIAGNOSIS — R454 Irritability and anger: Secondary | ICD-10-CM | POA: Diagnosis not present

## 2015-09-10 DIAGNOSIS — F419 Anxiety disorder, unspecified: Secondary | ICD-10-CM | POA: Diagnosis not present

## 2015-09-10 MED ORDER — SERTRALINE HCL 50 MG PO TABS: ORAL_TABLET | ORAL | Status: DC

## 2015-09-10 NOTE — Progress Notes (Signed)
Pre visit review using our clinic review tool, if applicable. No additional management support is needed unless otherwise documented below in the visit note.  Chief Complaint  Patient presents with  . Depression    Needs new depression medication.  On such medication in the past.    HPI: Patient Pamela [name]  comes in today for SDA for  new problem evaluation.  Hx of turmoik inside and not sad but irritable and jsut not happy  In past has been on prozac and then recently cymbalta thinking she didt get much help  She though ( weaned about 6-8 months ago?)  IBS  Per dr Collene Mares. Not sleeping as well and eating  Too much  Uncertain if anxiety or depression.  Irritable   And unhappy negative.  Gets  Mad easily.    Not going to talk to counselor  . Does yoga and feels well with that.  ROS: See pertinent positives and negatives per HPI.  Past Medical History  Diagnosis Date  . Vaginitis, atrophic     recurrent  . Depression   . Arthritis KNEES, HANDS, SHOULDERS  . Impingement syndrome of right shoulder   . Hyperlipidemia, mixed   . Hx of Clostridium difficile infection   . Skin cancer     Tonia Brooms     Family History  Problem Relation Age of Onset  . Arthritis Sister      CPPD autoinmmune  . Hyperlipidemia      family hx  . Hypertension      family hx  . Sudden death      family hx  . Heart disease      family hx  . Multiple myeloma Father     deceased  . Stroke Mother 47    Social History   Social History  . Marital Status: Married    Spouse Name: N/A  . Number of Children: N/A  . Years of Education: N/A   Occupational History  . retired    Social History Main Topics  . Smoking status: Never Smoker   . Smokeless tobacco: Never Used  . Alcohol Use: Yes     Comment: OCCASIONAL  . Drug Use: No  . Sexual Activity: Not Asked   Other Topics Concern  . 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  Has smoke detector and wears seat belts.  No firearms. No excess sun exposure. Sees dentist regularly .   Dexa nl 2005 and 2008    Outpatient Prescriptions Prior to Visit  Medication Sig Dispense Refill  . aspirin 500 MG tablet Take 500 mg by mouth as needed for pain. Uses for Arthritis    . Cholecalciferol (D3-1000 PO) Take 1 capsule by mouth daily.    Marland Kitchen conjugated estrogens (PREMARIN) vaginal cream Use vaginally as directed 42.5 g 11  . Probiotic Product (PROBIOTIC PO) Take 1 capsule by mouth daily.     No facility-administered medications prior to visit.     EXAM:  BP 150/72 mmHg  Temp(Src) 98.3 F (36.8 C) (Oral)  Ht '5\' 4"'  (1.626 m)  Wt 156 lb 6.4 oz (70.943 kg)  BMI 26.83 kg/m2  Body mass index is 26.83 kg/(m^2).  GENERAL: vitals reviewed and listed above, alert, oriented, appears well hydrated and in no acute distress HEENT: atraumatic, conjunctiva  clear, no obvious abnormalities on inspection of external nose and ears PSYCH: pleasant and cooperative, no obvious depression or anxiety some stress    ASSESSMENT AND PLAN:  Discussed the following assessment and plan:  Irritable  Anxiety stress   affecting relationships   Gad score high depression sx mild mod .  Disc  many options  Use her yoga technique and add medication and fu in a month Last labs [place] Total visit 54mns > 50% spent counseling and coordinating care as indicated in above note and in instructions to patient .   -Patient advised to return or notify health care team  if symptoms worsen ,persist or new concerns arise.  Patient Instructions  Begin  Sertraline  .   As directed and plan  rov in 1 month or as needed.    Generalized Anxiety Disorder Generalized anxiety disorder (GAD) is a mental disorder. It interferes with life functions, including relationships, work, and school. GAD is different from normal anxiety, which everyone experiences at some  point in their lives in response to specific life events and activities. Normal anxiety actually helps [place] for and get through these life events and activities. Normal anxiety goes away after the event or activity is over.  GAD causes anxiety that is not necessarily related to specific events or activities. It also causes excess anxiety in proportion to specific events or activities. The anxiety associated with GAD is also difficult to control. GAD can vary from mild to severe. People with severe GAD can have intense waves of anxiety with physical symptoms (panic attacks).  SYMPTOMS The anxiety and worry associated with GAD are difficult to control. This anxiety and worry are related to many life events and activities and also occur more days than not for 6 months or longer. People with GAD also have three or more of the following symptoms (one or more in children):  Restlessness.   Fatigue.  Difficulty concentrating.   Irritability.  Muscle tension.  Difficulty sleeping or unsatisfying sleep. DIAGNOSIS GAD is diagnosed through an assessment by your health care provider. Your health care provider will ask you questions aboutyour mood,physical symptoms, and events in your life. Your health care provider may ask you about your medical history and use of alcohol or drugs, including prescription medicines. Your health care provider may also do a physical exam and blood tests. Certain medical conditions and the use of certain substances can cause symptoms similar to those associated with GAD. Your health care provider may refer you to a mental health specialist for further evaluation. TREATMENT The following therapies are usually used to treat GAD:   Medication. Antidepressant medication usually is prescribed for long-term daily control. Antianxiety medicines may be added in severe cases, especially when panic attacks occur.   Talk therapy (psychotherapy). Certain types of talk therapy  can be helpful in treating GAD by providing support, education, and guidance. A form of talk therapy called cognitive behavioral therapy can teach you healthy ways to think about and react to daily life events and activities.  Stress managementtechniques. These include yoga, meditation, and exercise and can be very helpful when they are practiced regularly. A mental health specialist can help determine which treatment is best for you. Some people see improvement with one therapy. However, other people require a combination of therapies.   This information is not intended to replace advice given to you by your health care provider. Make sure you discuss any questions you have with your health care provider.  Document Released: 12/06/2012 Document Revised: 09/01/2014 Document Reviewed: 12/06/2012 Elsevier Interactive Patient Education 2016 [place]. Reese Stockman M.D.

## 2015-09-10 NOTE — Telephone Encounter (Signed)
Sent to the pharmacy by e-scribe. 

## 2015-09-10 NOTE — Patient Instructions (Signed)
Begin  Sertraline  .   As directed and plan  rov in 1 month or as needed.    Generalized Anxiety Disorder Generalized anxiety disorder (GAD) is a mental disorder. It interferes with life functions, including relationships, work, and school. GAD is different from normal anxiety, which everyone experiences at some point in their lives in response to specific life events and activities. Normal anxiety actually helps [place] prepare for and get through these life events and activities. Normal anxiety goes away after the event or activity is over.  GAD causes anxiety that is not necessarily related to specific events or activities. It also causes excess anxiety in proportion to specific events or activities. The anxiety associated with GAD is also difficult to control. GAD can vary from mild to severe. People with severe GAD can have intense waves of anxiety with physical symptoms (panic attacks).  SYMPTOMS The anxiety and worry associated with GAD are difficult to control. This anxiety and worry are related to many life events and activities and also occur more days than not for 6 months or longer. People with GAD also have three or more of the following symptoms (one or more in children):  Restlessness.   Fatigue.  Difficulty concentrating.   Irritability.  Muscle tension.  Difficulty sleeping or unsatisfying sleep. DIAGNOSIS GAD is diagnosed through an assessment by your health care provider. Your health care provider will ask you questions aboutyour mood,physical symptoms, and events in your life. Your health care provider may ask you about your medical history and use of alcohol or drugs, including prescription medicines. Your health care provider may also do a physical exam and blood tests. Certain medical conditions and the use of certain substances can cause symptoms similar to those associated with GAD. Your health care provider may refer you to a mental health specialist for further  evaluation. TREATMENT The following therapies are usually used to treat GAD:   Medication. Antidepressant medication usually is prescribed for long-term daily control. Antianxiety medicines may be added in severe cases, especially when panic attacks occur.   Talk therapy (psychotherapy). Certain types of talk therapy can be helpful in treating GAD by providing support, education, and guidance. A form of talk therapy called cognitive behavioral therapy can teach you healthy ways to think about and react to daily life events and activities.  Stress managementtechniques. These include yoga, meditation, and exercise and can be very helpful when they are practiced regularly. A mental health specialist can help determine which treatment is best for you. Some people see improvement with one therapy. However, other people require a combination of therapies.   This information is not intended to replace advice given to you by your health care provider. Make sure you discuss any questions you have with your health care provider.   Document Released: 12/06/2012 Document Revised: 09/01/2014 Document Reviewed: 12/06/2012 Elsevier Interactive Patient Education [employer].

## 2015-10-11 ENCOUNTER — Encounter: Payer: Self-pay | Admitting: Internal Medicine

## 2015-10-11 ENCOUNTER — Ambulatory Visit (INDEPENDENT_AMBULATORY_CARE_PROVIDER_SITE_OTHER): Payer: Medicare Other | Admitting: Internal Medicine

## 2015-10-11 VITALS — BP 122/62 | HR 67 | Temp 98.4°F | Wt 153.0 lb

## 2015-10-11 DIAGNOSIS — F4329 Adjustment disorder with other symptoms: Secondary | ICD-10-CM

## 2015-10-11 DIAGNOSIS — F419 Anxiety disorder, unspecified: Secondary | ICD-10-CM | POA: Diagnosis not present

## 2015-10-11 DIAGNOSIS — Z79899 Other long term (current) drug therapy: Secondary | ICD-10-CM | POA: Diagnosis not present

## 2015-10-11 DIAGNOSIS — R454 Irritability and anger: Secondary | ICD-10-CM | POA: Diagnosis not present

## 2015-10-11 NOTE — Progress Notes (Signed)
Chief Complaint  Patient presents with  . Follow-up    HPI: Pamela [name] 65 y.o. here for fu  Anxiety depression  Seeing much better   ..general   Thinks  Doing well and  No sig se at this time   50 mg per day  ROS: See pertinent positives and negatives per HPI. Mom 90s  Med issues and helping get care  Past Medical History  Diagnosis Date  . Vaginitis, atrophic     recurrent  . Depression   . Arthritis KNEES, HANDS, SHOULDERS  . Impingement syndrome of right shoulder   . Hyperlipidemia, mixed   . Hx of Clostridium difficile infection   . Skin cancer     Tonia Brooms     Family History  Problem Relation Age of Onset  . Arthritis Sister      CPPD autoinmmune  . Hyperlipidemia      family hx  . Hypertension      family hx  . Sudden death      family hx  . Heart disease      family hx  . Multiple myeloma Father     deceased  . Stroke Mother 50    Social History   Social History  . Marital Status: Married    Spouse Name: N/A  . Number of Children: N/A  . Years of Education: N/A   Occupational History  . retired    Social History Main Topics  . Smoking status: Never Smoker   . Smokeless tobacco: Never Used  . Alcohol Use: Yes     Comment: OCCASIONAL  . Drug Use: No  . Sexual Activity: Not Asked   Other Topics Concern  . 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  Has smoke detector and wears seat belts.  No firearms. No excess sun exposure. Sees dentist regularly .   Dexa nl 2005 and 2008    Outpatient Prescriptions Prior to Visit  Medication Sig Dispense Refill  . aspirin 500 MG tablet Take 500 mg by mouth as needed for pain. Uses for Arthritis    . Cholecalciferol (D3-1000 PO) Take 1 capsule by mouth daily.    Marland Kitchen conjugated estrogens (PREMARIN) vaginal cream Use vaginally as directed 42.5 g 11  . PREMARIN vaginal cream USE VAGINALLY AS  DIRECTED 30 g 8  . Probiotic Product (PROBIOTIC PO) Take 1 capsule by mouth daily.    . sertraline (ZOLOFT) 50 MG tablet Take 25 mg per day for a week and then increase to 50 m or as directed . 30 tablet 3   No facility-administered medications prior to visit.     EXAM:  BP 122/62 mmHg  Pulse 67  Temp(Src) 98.4 F (36.9 C) (Oral)  Wt 153 lb (69.4 kg)  SpO2 97%  Body mass index is 26.25 kg/(m^2).  GENERAL: vitals reviewed and listed above, alert, oriented, appears well hydrated and in no acute distress HPSYCH: pleasant and cooperative, no obvious depression or anxiety Gad down to 2!  interview   ASSESSMENT AND PLAN:  Discussed the following assessment and plan:  Irritable - excellent response to medicine at this time   continue life style changes and med recheck at [facility] in novemeber or as needed  Anxiety  Medication management  Adjustment disorder with other symptom Total  visit 71mns > 50% spent counseling and coordinating care as indicated in above note and in instructions to patient .  -Patient advised to return or notify health care team  if symptoms worsen ,persist or new concerns arise.  Patient Instructions  Glad you are doing better  Continue healthy lifestyle   Conditions .  Continue medications.  Have pharmacy get refills requests.  Otherwise see   You in   November .     WStandley Brooking Panosh M.D.

## 2015-10-11 NOTE — Patient Instructions (Signed)
Glad you are doing better  Continue healthy lifestyle   Conditions .  Continue medications.  Have pharmacy get refills requests.  Otherwise see   You in   November .

## 2016-01-07 ENCOUNTER — Other Ambulatory Visit: Payer: Self-pay | Admitting: Internal Medicine

## 2016-01-08 NOTE — Telephone Encounter (Signed)
Sent to the pharmacy by e-scribe. 

## 2016-04-09 ENCOUNTER — Encounter: Payer: Self-pay | Admitting: Internal Medicine

## 2016-06-04 ENCOUNTER — Other Ambulatory Visit: Payer: Self-pay | Admitting: Internal Medicine

## 2016-06-04 DIAGNOSIS — Z1231 Encounter for screening mammogram for malignant neoplasm of breast: Secondary | ICD-10-CM

## 2016-06-11 ENCOUNTER — Ambulatory Visit
Admission: RE | Admit: 2016-06-11 | Discharge: 2016-06-11 | Disposition: A | Discharge status: Home / Self Care | Payer: Medicare Other | Source: Ambulatory Visit | Attending: Internal Medicine | Admitting: Internal Medicine

## 2016-06-11 DIAGNOSIS — Z1231 Encounter for screening mammogram for malignant neoplasm of breast: Secondary | ICD-10-CM

## 2016-06-20 ENCOUNTER — Other Ambulatory Visit (INDEPENDENT_AMBULATORY_CARE_PROVIDER_SITE_OTHER): Payer: Medicare Other

## 2016-06-20 DIAGNOSIS — Z0000 Encounter for general adult medical examination without abnormal findings: Secondary | ICD-10-CM

## 2016-06-20 LAB — CBC WITH DIFFERENTIAL/PLATELET
BASOS ABS: 0 10*3/uL (ref 0.0–0.1)
Basophils Relative: 0.7 % (ref 0.0–3.0)
EOS ABS: 0.1 10*3/uL (ref 0.0–0.7)
Eosinophils Relative: 1.6 % (ref 0.0–5.0)
HEMATOCRIT: 36.6 % (ref 36.0–46.0)
HEMOGLOBIN: 12.3 g/dL (ref 12.0–15.0)
LYMPHS PCT: 42.9 % (ref 12.0–46.0)
Lymphs Abs: 2 10*3/uL (ref 0.7–4.0)
MCHC: 33.6 g/dL (ref 30.0–36.0)
MCV: 80.9 fl (ref 78.0–100.0)
Monocytes Absolute: 0.4 10*3/uL (ref 0.1–1.0)
Monocytes Relative: 7.9 % (ref 3.0–12.0)
NEUTROS ABS: 2.2 10*3/uL (ref 1.4–7.7)
Neutrophils Relative %: 46.9 % (ref 43.0–77.0)
PLATELETS: 231 10*3/uL (ref 150.0–400.0)
RBC: 4.52 Mil/uL (ref 3.87–5.11)
RDW: 14.6 % (ref 11.5–15.5)
WBC: 4.7 10*3/uL (ref 4.0–10.5)

## 2016-06-20 LAB — HEPATIC FUNCTION PANEL
ALK PHOS: 52 U/L (ref 39–117)
ALT: 12 U/L (ref 0–35)
AST: 16 U/L (ref 0–37)
Albumin: 4.3 g/dL (ref 3.5–5.2)
BILIRUBIN DIRECT: 0.1 mg/dL (ref 0.0–0.3)
TOTAL PROTEIN: 6.4 g/dL (ref 6.0–8.3)
Total Bilirubin: 0.5 mg/dL (ref 0.2–1.2)

## 2016-06-20 LAB — BASIC METABOLIC PANEL
BUN: 20 mg/dL (ref 6–23)
CALCIUM: 8.8 mg/dL (ref 8.4–10.5)
CO2: 29 mEq/L (ref 19–32)
CREATININE: 0.97 mg/dL (ref 0.40–1.20)
Chloride: 105 mEq/L (ref 96–112)
GFR: 60.86 mL/min (ref >60.00)
Glucose, Bld: 88 mg/dL (ref 70–99)
Potassium: 3.5 mEq/L (ref 3.5–5.1)
Sodium: 140 mEq/L (ref 135–145)

## 2016-06-20 LAB — LIPID PANEL
CHOL/HDL RATIO: 4
Cholesterol: 260 mg/dL — ABNORMAL HIGH (ref 0–200)
HDL: 62.2 mg/dL (ref >39.00)
LDL Cholesterol: 174 mg/dL — ABNORMAL HIGH (ref 0–99)
NONHDL: 197.42
Triglycerides: 115 mg/dL (ref 0.0–149.0)
VLDL: 23 mg/dL (ref 0.0–40.0)

## 2016-06-23 LAB — TSH: TSH: 1.82 u[IU]/mL (ref 0.35–4.50)

## 2016-06-26 NOTE — Progress Notes (Signed)
Pre visit review using our clinic review tool, if applicable. No additional management support is needed unless otherwise documented below in the visit note.  Chief Complaint  Patient presents with  . Medicare Wellness    HPI: Pamela [name] 65 y.o. comes in today for Preventive  Exam and Medicare wellness visit .Since last visit.  Doing ok not as much exercise cause of husband rx with tongue cancer finishing raidation and has  A feeding tube. Still exercises  Sertraline 50 doing well  Some irritability but doing ok   Health Maintenance  Topic Date Due  . Hepatitis C Screening  07/13/1951  . DEXA SCAN  05/04/2014  . TETANUS/TDAP  07/05/2017  . MAMMOGRAM  06/11/2018  . COLONOSCOPY  07/22/2025  . INFLUENZA VACCINE  Completed  . ZOSTAVAX  Completed  . PNA vac Low Risk Adult  Completed   Health Maintenance Review LIFESTYLE:  TAD ,no to soc etoh 1-2  Yoga cardio  Sugar beverages: Sleep:ok    MEDICARE DOCUMENT QUESTIONS  TO SCAN   Hearing: ok  Vision:  No limitations at present . Last eye check UTD  Safety:  Has smoke detector and wears seat belts.  No firearms. No excess sun exposure. Sees dentist regularly.  Falls: n  Advance directive :  Reviewed  Has one.  Memory: Felt to be good  , no concern from her or her family.  Depression: No anhedonia unusual crying or depressive symptoms  Nutrition: Eats well balanced diet; adequate calcium and vitamin D. No swallowing chewing problems.  Injury: no major injuries in the last six months.  Other healthcare providers:  Reviewed today .  Social:  Lives with spouse married. No pets.   Preventive parameters: up-to-date  Reviewed   ADLS:   There are no problems or need for assistance  driving, feeding, obtaining food, dressing, toileting and bathing, managing money using phone. She is independent.    ROS:  GEN/ HEENT: No fever, significant weight changes sweats headaches vision problems hearing changes, CV/ PULM; No  chest pain shortness of breath cough, syncope,edema  change in exercise tolerance. GI /GU: No adominal pain, vomiting, change in bowel habits. No blood in the stool. No significant GU symptoms. SKIN/HEME: ,no acute skin rashes suspicious lesions or bleeding. No lymphadenopathy, nodules, masses.  NEURO/ PSYCH:  No neurologic signs such as weakness numbness. No depression anxiety. IMM/ Allergy: No unusual infections.  Allergy .   REST of 12 system review negative except as per HPI   Past Medical History:  Diagnosis Date  . Arthritis KNEES, HANDS, SHOULDERS  . Depression   . Hx of Clostridium difficile infection   . Hyperlipidemia, mixed   . Impingement syndrome of right shoulder   . Skin cancer    Tonia Brooms   . Vaginitis, atrophic    recurrent    Family History  Problem Relation Age of Onset  . Arthritis Sister      CPPD autoinmmune  . Hyperlipidemia      family hx  . Hypertension      family hx  . Sudden death      family hx  . Heart disease      family hx  . Multiple myeloma Father     deceased  . Stroke Mother 41    Social History   Social History  . Marital status: Married    Spouse name: N/A  . Number of children: N/A  . Years of education: N/A   Occupational History  .  retired Retired   Social History Main Topics  . Smoking status: Never Smoker  . Smokeless tobacco: Never Used  . Alcohol use Yes     Comment: OCCASIONAL  . Drug use: No  . Sexual activity: Not Asked   Other Topics Concern  . 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  Has smoke detector and wears seat belts.  No firearms. No excess sun exposure. Sees dentist regularly .   Dexa nl 2005 and 2008    Outpatient Encounter Prescriptions as of 06/27/2016  Medication Sig  . aspirin 500 MG tablet Take 500 mg by mouth as needed for pain. Uses for Arthritis  . Cholecalciferol (D3-1000  PO) Take 1 capsule by mouth daily.  Marland Kitchen conjugated estrogens (PREMARIN) vaginal cream Use vaginally as directed  . PREMARIN vaginal cream USE VAGINALLY AS DIRECTED  . Probiotic Product (PROBIOTIC PO) Take 1 capsule by mouth daily.  . sertraline (ZOLOFT) 50 MG tablet 1 po qd  . [DISCONTINUED] conjugated estrogens (PREMARIN) vaginal cream Use vaginally as directed  . [DISCONTINUED] sertraline (ZOLOFT) 50 MG tablet TAKE 1/2 tablet PER DAY FOR A WEEK AND THEN INCREASE TO 1 tablet OR AS DIRECTED.   No facility-administered encounter medications on file as of 06/27/2016.     EXAM:  BP 122/74 (BP Location: Right Arm, Patient Position: Sitting, Cuff Size: Normal)   Temp 98.3 F (36.8 C) (Oral)   Ht 5' 4.5" (1.638 m)   Wt 152 lb 9.6 oz (69.2 kg)   BMI 25.79 kg/m   Body mass index is 25.79 kg/m.  Physical Exam: Vital signs reviewed KZL:DJTT is a well-developed well-nourished alert cooperative   who appears stated age in no acute distress.  HEENT: normocephalic atraumatic , Eyes: PERRL EOM's full, conjunctiva clear, Nares: paten,t no deformity discharge or tenderness., Ears: no deformity EAC's clear TMs with normal landmarks. Mouth: clear OP, no lesions, edema.  Moist mucous membranes. Dentition in adequate repair. NECK: supple without masses, thyromegaly or bruits. CHEST/PULM:  Clear to auscultation and percussion breath sounds equal no wheeze , rales or rhonchi. No chest wall deformities or tenderness. CV: PMI is nondisplaced, S1 S2 no gallops, murmurs, rubs. Peripheral pulses are full without delay.No JVD . Breast: normal by inspection . No dimpling, discharge, masses, tenderness or discharge . ABDOMEN: Bowel sounds normal nontender  No guard or rebound, no hepato splenomegal no CVA tenderness.   Extremtities:  No clubbing cyanosis or edema, no acute joint swelling or redness no focal atrophy NEURO:  Oriented x3, cranial nerves 3-12 appear to be intact, no obvious focal weakness,gait within  normal limits no abnormal reflexes or asymmetrical SKIN: No acute rashes normal turgor, color, no bruising or petechiae. PSYCH: Oriented, good eye contact, no obvious depression anxiety, cognition and judgment appear normal. LN: no cervical axillary  adenopathy No noted deficits in memory, attention, and speech.   Lab Results  Component Value Date   WBC 4.7 06/20/2016   HGB 12.3 06/20/2016   HCT 36.6 06/20/2016   PLT 231.0 06/20/2016   GLUCOSE 88 06/20/2016   CHOL 260 (H) 06/20/2016   TRIG 115.0 06/20/2016   HDL 62.20 06/20/2016   LDLDIRECT 173.5 06/13/2013   LDLCALC 174 (H) 06/20/2016   ALT 12 06/20/2016   AST 16 06/20/2016   NA 140 06/20/2016  K 3.5 06/20/2016   CL 105 06/20/2016   CREATININE 0.97 06/20/2016   BUN 20 06/20/2016   CO2 29 06/20/2016   TSH 1.82 06/20/2016    ASSESSMENT AND PLAN:  Discussed the following assessment and plan:  Visit for preventive health examination  Medicare annual wellness visit, subsequent  Medication management - beneffit more than risk for  sertraline  Need for prophylactic vaccination and inoculation against influenza - Plan: Flu vaccine HIGH DOSE PF (Fluzone High dose)  Hyperlipidemia, unspecified hyperlipidemia type - 6.6 % 10 year risk lsi and fu se of simva and atorva  Estrogen deficiency - Plan: DG Bone Density Same med  dexa ordered  Check lipid and hep c screen  in 6 months  Patient Care Team: Burnis Medin, MD as PCP - General Paralee Cancel, MD (Orthopedic Surgery) Crista Luria, MD (Dermatology) Juanita Craver, MD as Attending Physician (Gastroenterology) Paralee Cancel, MD as Consulting Physician (Orthopedic Surgery)  Patient Instructions  You healthy diet and exercise to help reduce your cholesterol levels. We'll refill your sertraline for 90 days. Also your estrogen cream. Check hepatitis C and lipid panel in about 6 months no office visit needed if you're doing well. Make appointment for bone density will let you  know the results. Encourage you to sign up for my chart for better pathway for communication. Checkup in a year. Can make  wellness visit with Wynetta Fines our health coach and  physical and med labs appointments  with me.       Standley Brooking. Sutter Ahlgren M.D.

## 2016-06-27 ENCOUNTER — Encounter: Payer: Self-pay | Admitting: Internal Medicine

## 2016-06-27 ENCOUNTER — Ambulatory Visit (INDEPENDENT_AMBULATORY_CARE_PROVIDER_SITE_OTHER): Payer: Medicare Other | Admitting: Internal Medicine

## 2016-06-27 ENCOUNTER — Ambulatory Visit (INDEPENDENT_AMBULATORY_CARE_PROVIDER_SITE_OTHER)
Admission: RE | Admit: 2016-06-27 | Discharge: 2016-06-27 | Disposition: A | Discharge status: Home / Self Care | Payer: Medicare Other | Source: Ambulatory Visit | Attending: Internal Medicine | Admitting: Internal Medicine

## 2016-06-27 VITALS — BP 122/74 | Temp 98.3°F | Ht 64.5 in | Wt 152.6 lb

## 2016-06-27 DIAGNOSIS — E2839 Other primary ovarian failure: Secondary | ICD-10-CM

## 2016-06-27 DIAGNOSIS — Z0000 Encounter for general adult medical examination without abnormal findings: Secondary | ICD-10-CM | POA: Diagnosis not present

## 2016-06-27 DIAGNOSIS — Z79899 Other long term (current) drug therapy: Secondary | ICD-10-CM

## 2016-06-27 DIAGNOSIS — E785 Hyperlipidemia, unspecified: Secondary | ICD-10-CM | POA: Diagnosis not present

## 2016-06-27 DIAGNOSIS — Z23 Encounter for immunization: Secondary | ICD-10-CM | POA: Diagnosis not present

## 2016-06-27 MED ORDER — ESTROGENS, CONJUGATED 0.625 MG/GM VA CREA: TOPICAL_CREAM | VAGINAL | 11 refills | Status: DC

## 2016-06-27 MED ORDER — SERTRALINE HCL 50 MG PO TABS: ORAL_TABLET | ORAL | 3 refills | Status: DC

## 2016-06-27 NOTE — Patient Instructions (Signed)
You healthy diet and exercise to help reduce your cholesterol levels. We'll refill your sertraline for 90 days. Also your estrogen cream. Check hepatitis C and lipid panel in about 6 months no office visit needed if you're doing well. Make appointment for bone density will let you know the results. Encourage you to sign up for my chart for better pathway for communication. Checkup in a year. Can make  wellness visit with Wynetta Fines our health coach and  physical and med labs appointments  with me.

## 2016-09-04 NOTE — Progress Notes (Signed)
Pre visit review using our clinic review tool, if applicable. No additional management support is needed unless otherwise documented below in the visit note.  Chief Complaint  Patient presents with  . Vaginal Itching    X2wks    HPI: Pamela [name] 66 y.o.  sda  Co 2 weeks or external vagina itching without dc  Had been using premarin about 2 x per week or less    Not sure  Of dx   Past hx of  bv  And used to have yeast  A lot .  Little odor .    And goes away with prenmarin cream .  Felt swollen  Also  No DC  Self  rx and  antiitch  Without .  HCS  And helped with cocoumut oil  Is sensitive to some topical HCS  No dc some burning and itching  No recent antibiotic.  ROS: See pertinent positives and negatives per HPI.  Past Medical History:  Diagnosis Date  . Arthritis KNEES, HANDS, SHOULDERS  . Depression   . Hx of Clostridium difficile infection   . Hyperlipidemia, mixed   . Impingement syndrome of right shoulder   . Skin cancer    Tonia Brooms   . Vaginitis, atrophic    recurrent    Family History  Problem Relation Age of Onset  . Arthritis Sister      CPPD autoinmmune  . Hyperlipidemia      family hx  . Hypertension      family hx  . Sudden death      family hx  . Heart disease      family hx  . Multiple myeloma Father     deceased  . Stroke Mother 57    Social History   Social History  . Marital status: Married    Spouse name: N/A  . Number of children: N/A  . Years of education: N/A   Occupational History  . retired Retired   Social History Main Topics  . Smoking status: Never Smoker  . Smokeless tobacco: Never Used  . Alcohol use Yes     Comment: OCCASIONAL  . Drug use: No  . Sexual activity: Not Asked   Other Topics Concern  . 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  Has smoke detector and wears seat belts.  No firearms. No excess  sun exposure. Sees dentist regularly .   Dexa nl 2005 and 2008    Outpatient Medications Prior to Visit  Medication Sig Dispense Refill  . aspirin 500 MG tablet Take 500 mg by mouth as needed for pain. Uses for Arthritis    . Cholecalciferol (D3-1000 PO) Take 1 capsule by mouth daily.    Marland Kitchen conjugated estrogens (PREMARIN) vaginal cream Use vaginally as directed 42.5 g 11  . PREMARIN vaginal cream USE VAGINALLY AS DIRECTED 30 g 8  . Probiotic Product (PROBIOTIC PO) Take 1 capsule by mouth daily.    . sertraline (ZOLOFT) 50 MG tablet 1 po qd 90 tablet 3   No facility-administered medications prior to visit.      EXAM:  BP 126/76 (BP Location: Left Arm, Patient Position: Sitting, Cuff Size: Normal)   Temp 98.1 F (36.7 C) (Oral)   Ht 5' 4.5" (1.638 m)   Wt 149 lb (67.6 kg)  BMI 25.18 kg/m   Body mass index is 25.18 kg/m.  GENERAL: vitals reviewed and listed above, [name], oriented, appears well hydrated and in no acute distress HEENT: atraumatic, conjunctiva  clear, no obvious abnormalities on inspection of external nose and ears  EXT GU:   Nl anatomy no dc  Some irritation at posterior perineum  Labia majora  And intertrigo  area  And post fourchette but no  Dc  Pink to red vag mucos and no sig dc     PSYCH: pleasant and cooperative, no obvious depression or anxiety  ASSESSMENT AND PLAN:  Discussed the following assessment and plan:  Itching in the vaginal area  Perineal itching, female  Atrophic vaginitis consider atrophic vaginitis   And poss yeast based on exam sensitive to some topicals so will do empiric diflucan and increase premarin to at least 5 days per week for now . Let us know if not resolving  Reassurance  -Patient advised to return or notify health care team  if symptoms worsen ,persist or new concerns arise.  Patient Instructions  Go back on regular  Premarin cream    As we discussed .   We will add  Diflucan pill that will treat yeast in vagina and on  skin .   Let us know if not getting  better  In another  week or so   No alarming findings  Noted  . On your exam today     Atrophic Vaginitis Introduction Atrophic vaginitis is when the tissues that line the vagina become dry and thin. This is caused by a drop in estrogen. Estrogen helps:  To keep the vagina moist.  To make a clear fluid that helps:  To lubricate the vagina for sex.  To protect the vagina from infection. If the lining of the vagina is dry and thin, it may:  Make sex painful. It may also cause bleeding.  Cause a feeling of:  Burning.  Irritation.  Itchiness.  Make an exam of your vagina painful. It may also cause bleeding.  Make you lose interest in sex.  Cause a burning feeling when you pee.  Make your vaginal fluid (discharge) brown or yellow. For some women, there are no symptoms. This condition is most common in women who do not get their regular menstrual periods anymore (menopause). This often starts when a woman is 28-24 years old. Follow these instructions at home:  Take medicines only as told by your doctor. Do not use any herbal or alternative medicines unless your doctor says it is okay.  Use over-the-counter products for dryness only as told by your doctor. These include:  Creams.  Lubricants.  Moisturizers.  Do not douche.  Do not use products that can make your vagina dry. These include:  Scented feminine sprays.  Scented tampons.  Scented soaps.  If it hurts to have sex, tell your sexual partner. Contact a doctor if:  Your discharge looks different than normal.  Your vagina has an unusual smell.  You have new symptoms.  Your symptoms do not get better with treatment.  Your symptoms get worse. This information is not intended to replace advice given to you by your health care provider. Make sure you discuss any questions you have with your health care provider. Document Released: 01/28/2008 Document Revised:  01/17/2016 Document Reviewed: 08/02/2014  2017 Elsevier     Mariann Laster K. Desi Rowe M.D.

## 2016-09-05 ENCOUNTER — Ambulatory Visit (INDEPENDENT_AMBULATORY_CARE_PROVIDER_SITE_OTHER): Payer: Medicare Other | Admitting: Internal Medicine

## 2016-09-05 ENCOUNTER — Encounter: Payer: Self-pay | Admitting: Internal Medicine

## 2016-09-05 VITALS — BP 126/76 | Temp 98.1°F | Ht 64.5 in | Wt 149.0 lb

## 2016-09-05 DIAGNOSIS — N952 Postmenopausal atrophic vaginitis: Secondary | ICD-10-CM | POA: Diagnosis not present

## 2016-09-05 DIAGNOSIS — L298 Other pruritus: Secondary | ICD-10-CM

## 2016-09-05 DIAGNOSIS — N898 Other specified noninflammatory disorders of vagina: Secondary | ICD-10-CM

## 2016-09-05 DIAGNOSIS — L293 Anogenital pruritus, unspecified: Secondary | ICD-10-CM

## 2016-09-05 MED ORDER — FLUCONAZOLE 150 MG PO TABS: 150.0000 mg | ORAL_TABLET | Freq: Once | ORAL | 0 refills | Status: AC

## 2016-09-05 NOTE — Patient Instructions (Addendum)
Go back on regular  Premarin cream    As we discussed .   We will add  Diflucan pill that will treat yeast in vagina and on skin .   Let us know if not getting  better  In another  week or so   No alarming findings  Noted  . On your exam today     Atrophic Vaginitis Introduction Atrophic vaginitis is when the tissues that line the vagina become dry and thin. This is caused by a drop in estrogen. Estrogen helps:  To keep the vagina moist.  To make a clear fluid that helps:  To lubricate the vagina for sex.  To protect the vagina from infection. If the lining of the vagina is dry and thin, it may:  Make sex painful. It may also cause bleeding.  Cause a feeling of:  Burning.  Irritation.  Itchiness.  Make an exam of your vagina painful. It may also cause bleeding.  Make you lose interest in sex.  Cause a burning feeling when you pee.  Make your vaginal fluid (discharge) brown or yellow. For some women, there are no symptoms. This condition is most common in women who do not get their regular menstrual periods anymore (menopause). This often starts when a woman is 18-64 years old. Follow these instructions at home:  Take medicines only as told by your doctor. Do not use any herbal or alternative medicines unless your doctor says it is okay.  Use over-the-counter products for dryness only as told by your doctor. These include:  Creams.  Lubricants.  Moisturizers.  Do not douche.  Do not use products that can make your vagina dry. These include:  Scented feminine sprays.  Scented tampons.  Scented soaps.  If it hurts to have sex, tell your sexual partner. Contact a doctor if:  Your discharge looks different than normal.  Your vagina has an unusual smell.  You have new symptoms.  Your symptoms do not get better with treatment.  Your symptoms get worse. This information is not intended to replace advice given to you by your health care provider. Make  sure you discuss any questions you have with your health care provider. Document Released: 01/28/2008 Document Revised: 01/17/2016 Document Reviewed: 08/02/2014  2017 Elsevier

## 2016-12-24 ENCOUNTER — Other Ambulatory Visit: Payer: Self-pay | Admitting: Family Medicine

## 2016-12-24 DIAGNOSIS — E782 Mixed hyperlipidemia: Secondary | ICD-10-CM

## 2016-12-24 DIAGNOSIS — Z7289 Other problems related to lifestyle: Secondary | ICD-10-CM

## 2016-12-24 DIAGNOSIS — Z0184 Encounter for antibody response examination: Secondary | ICD-10-CM

## 2016-12-25 ENCOUNTER — Other Ambulatory Visit: Payer: Medicare Other

## 2016-12-25 ENCOUNTER — Other Ambulatory Visit (INDEPENDENT_AMBULATORY_CARE_PROVIDER_SITE_OTHER): Payer: Medicare Other

## 2016-12-25 DIAGNOSIS — E782 Mixed hyperlipidemia: Secondary | ICD-10-CM | POA: Diagnosis not present

## 2016-12-25 DIAGNOSIS — Z0184 Encounter for antibody response examination: Secondary | ICD-10-CM

## 2016-12-25 DIAGNOSIS — Z7289 Other problems related to lifestyle: Secondary | ICD-10-CM

## 2016-12-25 LAB — LIPID PANEL
CHOLESTEROL: 253 mg/dL — AB (ref 0–200)
HDL: 73.3 mg/dL (ref >39.00)
LDL Cholesterol: 160 mg/dL — ABNORMAL HIGH (ref 0–99)
NONHDL: 179.97
Total CHOL/HDL Ratio: 3
Triglycerides: 101 mg/dL (ref 0.0–149.0)
VLDL: 20.2 mg/dL (ref 0.0–40.0)

## 2016-12-26 LAB — HEPATITIS C ANTIBODY: HCV AB: NEGATIVE

## 2017-01-01 ENCOUNTER — Ambulatory Visit: Payer: Medicare Other

## 2017-01-12 ENCOUNTER — Ambulatory Visit (INDEPENDENT_AMBULATORY_CARE_PROVIDER_SITE_OTHER): Payer: Medicare Other | Admitting: Internal Medicine

## 2017-01-12 ENCOUNTER — Encounter: Payer: Self-pay | Admitting: Internal Medicine

## 2017-01-12 VITALS — BP 110/70 | HR 69 | Temp 98.1°F | Ht 64.5 in | Wt 150.2 lb

## 2017-01-12 DIAGNOSIS — Z79899 Other long term (current) drug therapy: Secondary | ICD-10-CM

## 2017-01-12 DIAGNOSIS — T148XXA Other injury of unspecified body region, initial encounter: Secondary | ICD-10-CM | POA: Diagnosis not present

## 2017-01-12 DIAGNOSIS — Z23 Encounter for immunization: Secondary | ICD-10-CM | POA: Diagnosis not present

## 2017-01-12 DIAGNOSIS — L089 Local infection of the skin and subcutaneous tissue, unspecified: Secondary | ICD-10-CM

## 2017-01-12 DIAGNOSIS — W540XXA Bitten by dog, initial encounter: Secondary | ICD-10-CM | POA: Diagnosis not present

## 2017-01-12 MED ORDER — AMOXICILLIN-POT CLAVULANATE 875-125 MG PO TABS: 1.0000 | ORAL_TABLET | Freq: Two times a day (BID) | ORAL | 0 refills | Status: DC

## 2017-01-12 NOTE — Patient Instructions (Addendum)
It appears that your dog bite wound is infected Continue warm soaks gentle cleaning with antibiotic topical avoid trauma begin antibiotic Take probiotic to minimize the risk of bowel discomfort infection. Expect improvement in the redness and swelling over the next 3 days. If before the weekend it is not starting to improve contact us consider reevaluation. Otherwise protected with with time and recheck as needed. Can send in picture my chart if  Possible if needed.   Continue   Sertraline.     Animal Bite Animal bites can range from mild to serious. An animal bite can result in a scratch on the skin, a deep open cut, a puncture of the skin, a crush injury, or tearing away of the skin or a body part. A small bite from a house pet will usually not cause serious problems. However, some animal bites can become infected or injure a bone or other tissue. Bites from certain animals can be more dangerous because of the risk of spreading rabies, which is a serious viral infection. This risk is higher with bites from stray animals or wild animals, such as raccoons, foxes, skunks, and bats. Dogs are responsible for most animal bites. Children are bitten more often than adults. What are the signs or symptoms? Common symptoms of an animal bite include:  Pain.  Bleeding.  Swelling.  Bruising. How is this diagnosed? This condition may be diagnosed based on a physical exam and medical history. Your health care provider will examine the wound and ask for details about the animal and how the bite happened. You may also have tests, such as:  Blood tests to check for infection or to determine if surgery is needed.  X-rays to check for damage to bones or joints.  Culture test. This uses a sample of fluid from the wound to check for infection. How is this treated? Treatment varies depending on the location and type of animal bite and your medical history. Treatment may include:  Wound care. This  often includes cleaning the wound, flushing the wound with saline solution, and applying a bandage (dressing). Sometimes, the wound is left open to heal because of the high risk of infection. However, in some cases, the wound may be closed with stitches (sutures), staples, skin glue, or adhesive strips.  Antibiotic medicine.  Tetanus shot.  Rabies treatment if the animal could have rabies. In some cases, bites that have become infected may require IV antibiotics and surgical treatment in the hospital. Follow these instructions at home: Wound care   Follow instructions from your health care provider about how to take care of your wound. Make sure you:  Wash your hands with soap and water before you change your dressing. If soap and water are not available, use hand sanitizer.  Change your dressing as told by your health care provider.  Leave sutures, skin glue, or adhesive strips in place. These skin closures may need to be in place for 2 weeks or longer. If adhesive strip edges start to loosen and curl up, you may trim the loose edges. Do not remove adhesive strips completely unless your health care provider tells you to do that.  Check your wound every day for signs of infection. Watch for:  Increasing redness, swelling, or pain.  Fluid, blood, or pus. General instructions   Take or apply over-the-counter and prescription medicines only as told by your health care provider.  If you were prescribed an antibiotic, take or apply it as told by your health  care provider. Do not stop using the antibiotic even if your condition improves.  Keep the injured area raised (elevated) above the level of your heart while you are sitting or lying down, if this is possible.  If directed, apply ice to the injured area.  Put ice in a plastic bag.  Place a towel between your skin and the bag.  Leave the ice on for 20 minutes, 2-3 times per day.  Keep all follow-up visits as told by your health  care provider. This is important. Contact a health care provider if:  You have increasing redness, swelling, or pain at the site of your wound.  You have a general feeling of sickness (malaise).  You feel nauseous or you vomit.  You have pain that does not get better. Get help right away if:  You have a red streak extending away from your wound.  You have fluid, blood, or pus coming from your wound.  You have a fever or chills.  You have trouble moving your injured area.  You have numbness or tingling extending beyond the wound. This information is not intended to replace advice given to you by your health care provider. Make sure you discuss any questions you have with your health care provider. Document Released: 04/29/2011 Document Revised: 12/19/2015 Document Reviewed: 12/27/2014 Elsevier Interactive Patient Education  2017 [employer].

## 2017-01-12 NOTE — Progress Notes (Signed)
Chief Complaint  Patient presents with  . Animal Bite    HPI: Pamela [name] 66 y.o.  sda   1 week ago neighbors dog bit her right hand dorsum possibly related to a new puppy being around. Had a lot of bruising cleaned with soap water iodine for husband local care Neosporin and had been improving in size until about 48 hours ago when there was increased redness and swelling in the wound area No numbness tingling dysfunction of hand or serious pain no fever or adenopathy. Family members who is seen a picture are discussed told her to come in and see clinician because it was infected. No drainage. ROS: See pertinent positives and negatives per HPI.  On sertraline mom died and husb cancer rx and issues  Med helping a lot and coping  Past Medical History:  Diagnosis Date  . Arthritis KNEES, HANDS, SHOULDERS  . Depression   . Hx of Clostridium difficile infection   . Hyperlipidemia, mixed   . Impingement syndrome of right shoulder   . Skin cancer    Tonia Brooms   . Vaginitis, atrophic    recurrent    Family History  Problem Relation Age of Onset  . Arthritis Sister         CPPD autoinmmune  . Hyperlipidemia Unknown        family hx  . Hypertension Unknown        family hx  . Sudden death Unknown        family hx  . Heart disease Unknown        family hx  . Multiple myeloma Father        deceased  . Stroke Mother 79    Social History   Social History  . Marital status: Married    Spouse name: N/A  . Number of children: N/A  . Years of education: N/A   Occupational History  . retired Retired   Social History Main Topics  . Smoking status: Never Smoker  . Smokeless tobacco: Never Used  . Alcohol use Yes     Comment: OCCASIONAL  . Drug use: No  . Sexual activity: Not Asked   Other Topics Concern  . 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  Has smoke  detector and wears seat belts.  No firearms. No excess sun exposure. Sees dentist regularly .   Dexa nl 2005 and 2008    Outpatient Medications Prior to Visit  Medication Sig Dispense Refill  . aspirin 500 MG tablet Take 500 mg by mouth as needed for pain. Uses for Arthritis    . Cholecalciferol (D3-1000 PO) Take 1 capsule by mouth daily.    Marland Kitchen conjugated estrogens (PREMARIN) vaginal cream Use vaginally as directed 42.5 g 11  . PREMARIN vaginal cream USE VAGINALLY AS DIRECTED 30 g 8  . Probiotic Product (PROBIOTIC PO) Take 1 capsule by mouth daily.    . sertraline (ZOLOFT) 50 MG tablet 1 po qd 90 tablet 3   No facility-administered medications prior to visit.      EXAM:  BP 110/70 (BP Location: Left Arm, Patient Position: Sitting, Cuff Size: Normal)   Pulse 69   Temp 98.1 F (36.7 C) (Oral)   Ht 5' 4.5" (1.638 m)   Wt 150 lb 3.2 oz (68.1  kg)   BMI 25.38 kg/m   Body mass index is 25.38 kg/m.  GENERAL: vitals reviewed and listed above, alert, oriented, appears well hydrated and in no acute distress HEENT: atraumatic, conjunctiva  clear, no obvious abnormalities on inspection of external nose and ears MS: moves all extremities right hand with  Resolving bruising  And  2 cm raised red  Laceration trying to heal with mold tenderness  And no dc  rom hand fingesrs aer normal  Reviewed  Cell photos  And progrses  PSYCH: pleasant and cooperative, no obvious depression or anxiety  ASSESSMENT AND PLAN:  Discussed the following assessment and plan:  Dog bite, initial encounter - Plan: Td vaccine greater than or equal to 7yo preservative free IM  Infected bite wound  Medication management The last 48 hours shows a significant deterioration in the bleeding does it was healing. Now there is focal redness and raised area question infection with a hematoma but not large enough nor angry enough to intervene except for antibiotic and local care. Patient warned about alarm findings I think  this will improve with antibiotic and cautious wound care. Because it is a dog bite we'll be giving Augmentin. Has had problems with diarrhea and remote history of C. difficile. Take probiotic with the antibiotic and close follow-up as appropriate Patient is coping quite well with a number of issues illnesses etc. in the recent past so to remain on sertraline as I think it is helpful as she does 2. Update tetanus booster today because of injury. -Patient advised to return or notify health care team  if symptoms worsen ,persist or new concerns arise.  Patient Instructions   It appears that your dog bite wound is infected Continue warm soaks gentle cleaning with antibiotic topical avoid trauma begin antibiotic Take probiotic to minimize the risk of bowel discomfort infection. Expect improvement in the redness and swelling over the next 3 days. If before the weekend it is not starting to improve contact us consider reevaluation. Otherwise protected with with time and recheck as needed. Can send in picture my chart if  Possible if needed.   Continue   Sertraline.     Animal Bite Animal bites can range from mild to serious. An animal bite can result in a scratch on the skin, a deep open cut, a puncture of the skin, a crush injury, or tearing away of the skin or a body part. A small bite from a house pet will usually not cause serious problems. However, some animal bites can become infected or injure a bone or other tissue. Bites from certain animals can be more dangerous because of the risk of spreading rabies, which is a serious viral infection. This risk is higher with bites from stray animals or wild animals, such as raccoons, foxes, skunks, and bats. Dogs are responsible for most animal bites. Children are bitten more often than adults. What are the signs or symptoms? Common symptoms of an animal bite include:  Pain.  Bleeding.  Swelling.  Bruising. How is this diagnosed? This  condition may be diagnosed based on a physical exam and medical history. Your health care provider will examine the wound and ask for details about the animal and how the bite happened. You may also have tests, such as:  Blood tests to check for infection or to determine if surgery is needed.  X-rays to check for damage to bones or joints.  Culture test. This uses a sample of fluid from the wound  to check for infection. How is this treated? Treatment varies depending on the location and type of animal bite and your medical history. Treatment may include:  Wound care. This often includes cleaning the wound, flushing the wound with saline solution, and applying a bandage (dressing). Sometimes, the wound is left open to heal because of the high risk of infection. However, in some cases, the wound may be closed with stitches (sutures), staples, skin glue, or adhesive strips.  Antibiotic medicine.  Tetanus shot.  Rabies treatment if the animal could have rabies. In some cases, bites that have become infected may require IV antibiotics and surgical treatment in the hospital. Follow these instructions at home: Wound care   Follow instructions from your health care provider about how to take care of your wound. Make sure you:  Wash your hands with soap and water before you change your dressing. If soap and water are not available, use hand sanitizer.  Change your dressing as told by your health care provider.  Leave sutures, skin glue, or adhesive strips in place. These skin closures may need to be in place for 2 weeks or longer. If adhesive strip edges start to loosen and curl up, you may trim the loose edges. Do not remove adhesive strips completely unless your health care provider tells you to do that.  Check your wound every day for signs of infection. Watch for:  Increasing redness, swelling, or pain.  Fluid, blood, or pus. General instructions   Take or apply over-the-counter and  prescription medicines only as told by your health care provider.  If you were prescribed an antibiotic, take or apply it as told by your health care provider. Do not stop using the antibiotic even if your condition improves.  Keep the injured area raised (elevated) above the level of your heart while you are sitting or lying down, if this is possible.  If directed, apply ice to the injured area.  Put ice in a plastic bag.  Place a towel between your skin and the bag.  Leave the ice on for 20 minutes, 2-3 times per day.  Keep all follow-up visits as told by your health care provider. This is important. Contact a health care provider if:  You have increasing redness, swelling, or pain at the site of your wound.  You have a general feeling of sickness (malaise).  You feel nauseous or you vomit.  You have pain that does not get better. Get help right away if:  You have a red streak extending away from your wound.  You have fluid, blood, or pus coming from your wound.  You have a fever or chills.  You have trouble moving your injured area.  You have numbness or tingling extending beyond the wound. This information is not intended to replace advice given to you by your health care provider. Make sure you discuss any questions you have with your health care provider. Document Released: 04/29/2011 Document Revised: 12/19/2015 Document Reviewed: 12/27/2014 Elsevier Interactive Patient Education  2017 [place]. Kadisha Goodine M.D.

## 2017-01-20 NOTE — Progress Notes (Signed)
Chief Complaint  Patient presents with  . Follow-up    HPI: Pamela [name] 66 y.o.   Fu infected hematoma and dog bite   No fever   Some tenderness but still has  Lump and worried about advice given about risk of blood infectin .  No lof minimal pain tenderness at lateral side of lump   Has 1 days left of antibiotic.  ROS: See pertinent positives and negatives per HPI. No fever new redness bleeding  Past Medical History:  Diagnosis Date  . Arthritis KNEES, HANDS, SHOULDERS  . Depression   . Hx of Clostridium difficile infection   . Hyperlipidemia, mixed   . Impingement syndrome of right shoulder   . Skin cancer    Tonia Brooms   . Vaginitis, atrophic    recurrent    Family History  Problem Relation Age of Onset  . Arthritis Sister         CPPD autoinmmune  . Hyperlipidemia Unknown        family hx  . Hypertension Unknown        family hx  . Sudden death Unknown        family hx  . Heart disease Unknown        family hx  . Multiple myeloma Father        deceased  . Stroke Mother 68    Social History   Social History  . Marital status: Married    Spouse name: N/A  . Number of children: N/A  . Years of education: N/A   Occupational History  . retired Retired   Social History Main Topics  . Smoking status: Never Smoker  . Smokeless tobacco: Never Used  . Alcohol use Yes     Comment: OCCASIONAL  . Drug use: No  . Sexual activity: Not Asked   Other Topics Concern  . 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  Has smoke detector and wears seat belts.  No firearms. No excess sun exposure. Sees dentist regularly .   Dexa nl 2005 and 2008    Outpatient Medications Prior to Visit  Medication Sig Dispense Refill  . aspirin 500 MG tablet Take 500 mg by mouth as needed for pain. Uses for Arthritis    . Cholecalciferol (D3-1000 PO) Take 1 capsule by mouth  daily.    Marland Kitchen conjugated estrogens (PREMARIN) vaginal cream Use vaginally as directed 42.5 g 11  . Probiotic Product (PROBIOTIC PO) Take 1 capsule by mouth daily.    . sertraline (ZOLOFT) 50 MG tablet 1 po qd 90 tablet 3  . amoxicillin-clavulanate (AUGMENTIN) 875-125 MG tablet Take 1 tablet by mouth every 12 (twelve) hours. 20 tablet 0  . PREMARIN vaginal cream USE VAGINALLY AS DIRECTED 30 g 8   No facility-administered medications prior to visit.      EXAM:  BP 120/60 (BP Location: Right Arm, Patient Position: Sitting, Cuff Size: Normal)   Pulse 67   Temp 98 F (36.7 C) (Oral)   There is no height or weight on file to calculate BMI.  GENERAL: vitals reviewed and listed above, alert, oriented, appears well hydrated and in no acute distress HEENT: atraumatic, conjunctiva  clear, no obvious abnormalities on inspection of external nose and ears MS: moves all extremities without noticeable focal  Abnormality right hand dorsum with smaller 2 cm lump frim not hard central scab  Min tenderness  No redness and hand  Fully yellow from fading bruises .   Grip seems nl  PSYCH: pleasant and cooperative, no obvious depression or anxiety     ASSESSMENT AND PLAN:  Discussed the following assessment and plan:  Infected bite wound  Dog bite, sequela Appears much improved reassurance can add another 3-5 days of antibiotics local care although still a large long don't think it should be attempted to drain at this time. Expectant management and follow-up as needed. Take another 3 weeks to look  better. -Patient advised to return or notify health care team  if symptoms worsen ,persist or new concerns arise.  Patient Instructions  I think this is healing as expected   The  Bump is smaller and seems  More related to  Old blood collection ( hematoma)  At this time continue anticiotic another 3-5 days  At this time I dont think tryin g to drain it is  Helpful .  It may take a while for the bump area to   Resolve   .  If getting bigger let us know.      Standley Brooking. Nadya Hopwood M.D.

## 2017-01-21 ENCOUNTER — Encounter: Payer: Self-pay | Admitting: Internal Medicine

## 2017-01-21 ENCOUNTER — Ambulatory Visit (INDEPENDENT_AMBULATORY_CARE_PROVIDER_SITE_OTHER): Payer: Medicare Other | Admitting: Internal Medicine

## 2017-01-21 VITALS — BP 120/60 | HR 67 | Temp 98.0°F

## 2017-01-21 DIAGNOSIS — T148XXA Other injury of unspecified body region, initial encounter: Secondary | ICD-10-CM

## 2017-01-21 DIAGNOSIS — W540XXS Bitten by dog, sequela: Secondary | ICD-10-CM

## 2017-01-21 DIAGNOSIS — L089 Local infection of the skin and subcutaneous tissue, unspecified: Secondary | ICD-10-CM | POA: Diagnosis not present

## 2017-01-21 MED ORDER — AMOXICILLIN-POT CLAVULANATE 875-125 MG PO TABS: 1.0000 | ORAL_TABLET | Freq: Two times a day (BID) | ORAL | 0 refills | Status: DC

## 2017-01-21 NOTE — Patient Instructions (Signed)
I think this is healing as expected   The  Bump is smaller and seems  More related to  Old blood collection ( hematoma)  At this time continue anticiotic another 3-5 days  At this time I dont think tryin g to drain it is  Helpful .  It may take a while for the bump area to  Resolve   .  If getting bigger let us know.

## 2017-05-13 ENCOUNTER — Telehealth: Payer: Self-pay | Admitting: Internal Medicine

## 2017-05-13 NOTE — Telephone Encounter (Signed)
Pt has AWV scheduled for 9/21 - left message for her to call back and reschedule.  Pamela [name] will not be in the office until 9 )

## 2017-05-15 ENCOUNTER — Encounter: Payer: Self-pay | Admitting: Internal Medicine

## 2017-05-15 ENCOUNTER — Ambulatory Visit: Payer: Medicare Other

## 2017-05-19 NOTE — Progress Notes (Addendum)
Subjective:   Pamela [name] is a 66 y.o. female who presents for Medicare Annual (Subsequent) preventive examination.  The Patient was informed that the wellness visit is to identify future health risk and educate and initiate measures that can reduce risk for increased disease through the lifespan.    Annual Wellness Assessment  Reports health as   Preventive Screening -Counseling & Management  Medicare Annual Preventive Care Visit - Subsequent Last OV 12/2016/ preventive health 06/2016 Infected bite from dog  Colonoscopy 06/2015 Mammogram 05/2016  -will make an apt  Bone Density 06/2016 (-0.7)   IMM Flu vaccine Generally waits to Nov Advised against this but will take early October     VS reviewed;   Diet  Gained 10 lbs since march 4  Did not discuss diet as she eats well Tendency to stress eating  Normally eats well   BMI 26   Exercise Advanced yoga 3 days a week  Does weight machines; recumbent bike Feels great when you get through    Stressors: Spouse is dying of cancer - cancer in throat  When they screened him, cancer moved to his liver Has coded with chemo (took 4 rounds)  Lost mother march 4th   Asked about hospice; but last PET was not horrible Spouse has not agreed to further therapy  Has a feeding tube and trying to get this discontinued  His prognosis; tumor is there but doctor recommending treatment,  Immunotherapy; may try surgery  Her spouse is declining  Has spoken to [place] who is a chaplin at the cancer center;  Generally exercise helps her; did go 5 days a week  Advanced yoga x 3 days a week Has stopped yoga; just walking the dog   Support; takes the dog for a walk every day x 1 hour Eczema now due to stress  Irritable bowel syndrome as well, stress related    ms. Almanzar tx in the past for depression; 53m zoloft currently  Next apt 06/30/2017 with the cancer doctor  Sister just had a nervous breakdown and has panic attacks and is not  able to be supportive   Physical with Panosh 11/6 and then he has PET scan the day before Advised to consider apt for increase in med;  Could talk to the chaplin at the hospital  And the patient agreed to speak with her; this is a chaplin that works at the cancer center who knows her situation    Sleep patterns: takes naps during the day      Cardiac Risk Factors Addressed Hyperlipidemia HDL 73 / 253    Advanced Directives - will bring a copy to the office   Patient Care Team: Panosh, WStandley Brooking MD as PCP - General OParalee Cancel MD (Orthopedic Surgery) GCrista Luria MD (Dermatology) MJuanita Craver MD as Attending Physician (Gastroenterology) OParalee Cancel MD as Consulting Physician (Orthopedic Surgery)         Cardiac Risk Factors include: advanced age (>523m, >6>71omen);dyslipidemia     Objective:     Vitals: BP 126/70   Pulse 61   Ht 5' 4.5" (1.638 m)   Wt 157 lb (71.2 kg)   SpO2 98%   BMI 26.53 kg/m   Body mass index is 26.53 kg/m.   Tobacco History  Smoking Status  . Never Smoker  Smokeless Tobacco  . Never Used     Counseling given: Yes   Past Medical History:  Diagnosis Date  . Arthritis KNEES, HANDS, SHOULDERS  .  Depression   . Hx of Clostridium difficile infection   . Hyperlipidemia, mixed   . Impingement syndrome of right shoulder   . Skin cancer    Tonia Brooms   . Vaginitis, atrophic    recurrent   Past Surgical History:  Procedure Laterality Date  . ABDOMINAL HYSTERECTOMY  1992   W/ BILATERAL SALPINGO-OOPHORECTOMY  . CARPAL TUNNEL RELEASE    . SHOULDER ARTHROSCOPY WITH ROTATOR CUFF REPAIR  08/06/2012   Procedure: SHOULDER ARTHROSCOPY WITH ROTATOR CUFF REPAIR;  Surgeon: Magnus Sinning, MD;  Location: [facility];  Service: Orthopedics;  Laterality: Right;  . SHOULDER ARTHROSCOPY WITH SUBACROMIAL DECOMPRESSION  08/06/2012   Procedure: SHOULDER ARTHROSCOPY WITH SUBACROMIAL DECOMPRESSION;  Surgeon: Magnus Sinning, MD;   Location: [facility];  Service: Orthopedics;  Laterality: Right;  debridement of labral  . TONSILLECTOMY  AS CHILD   Family History  Problem Relation Age of Onset  . Arthritis Sister         CPPD autoinmmune  . Hyperlipidemia Unknown        family hx  . Hypertension Unknown        family hx  . Sudden death Unknown        family hx  . Heart disease Unknown        family hx  . Multiple myeloma Father        deceased  . Stroke Mother 37   History  Sexual Activity  . Sexual activity: Not on file    Outpatient Encounter Prescriptions as of 05/20/2017  Medication Sig  . amoxicillin-clavulanate (AUGMENTIN) 875-125 MG tablet Take 1 tablet by mouth every 12 (twelve) hours.  Marland Kitchen aspirin 500 MG tablet Take 500 mg by mouth as needed for pain. Uses for Arthritis  . Cholecalciferol (D3-1000 PO) Take 1 capsule by mouth daily.  Marland Kitchen conjugated estrogens (PREMARIN) vaginal cream Use vaginally as directed  . Probiotic Product (PROBIOTIC PO) Take 1 capsule by mouth daily.  . sertraline (ZOLOFT) 50 MG tablet 1 po qd   No facility-administered encounter medications on file as of 05/20/2017.     Activities of Daily Living In your present state of health, do you have any difficulty performing the following activities: 05/20/2017  Hearing? N  Vision? N  Difficulty concentrating or making decisions? N  Walking or climbing stairs? N  Dressing or bathing? N  Doing errands, shopping? N  Preparing Food and eating ? N  Using the Toilet? N  In the past six months, have you accidently leaked urine? N  Do you have problems with loss of bowel control? N  Managing your Medications? N  Managing your Finances? N  Housekeeping or managing your Housekeeping? N  Some recent data might be hidden    Patient Care Team: Panosh, Standley Brooking, MD as PCP - General Paralee Cancel, MD (Orthopedic Surgery) Crista Luria, MD (Dermatology) Juanita Craver, MD as Attending Physician (Gastroenterology) Paralee Cancel, MD as Consulting Physician (Orthopedic Surgery)    Assessment:    Exercise Activities and Dietary recommendations Current Exercise Habits: Structured exercise class, Time (Minutes): 45, Frequency (Times/Week): 3, Weekly Exercise (Minutes/Week): 135, Intensity: Moderate  Goals    . patient          Go to a yoga class; tomorrow afternoon at 4:30       Fall Risk Fall Risk  05/20/2017 06/27/2016 06/26/2015 06/21/2014  Falls in the past year? No No No No   Depression Screen PHQ 2/9 Scores 05/20/2017 06/27/2016  09/10/2015 06/26/2015  PHQ - 2 Score 2 0 3 0  PHQ- 9 Score - - 7 -    Depression score 2; but agrees to plan of action   Cognitive Function MMSE - Mini Mental State Exam 05/20/2017  Not completed: (No Data)     Ad8 score 0     Immunization History  Administered Date(s) Administered  . Influenza Split 07/15/2011, 06/15/2012  . Influenza Whole 07/06/2007, 06/07/2008, 05/16/2009, 05/20/2010  . Influenza, High Dose Seasonal PF 06/26/2015, 06/27/2016  . Influenza,inj,Quad PF,6+ Mos 06/20/2013, 06/21/2014  . Pneumococcal Conjugate-13 06/21/2014  . Pneumococcal Polysaccharide-23 06/26/2015  . Td 07/06/2007, 01/12/2017  . Zoster 04/11/2010   Screening Tests Health Maintenance  Topic Date Due  . INFLUENZA VACCINE  03/25/2017  . MAMMOGRAM  06/11/2018  . COLONOSCOPY  07/22/2025  . TETANUS/TDAP  01/13/2027  . DEXA SCAN  Completed  . Hepatitis C Screening  Completed  . PNA vac Low Risk Adult  Completed      Plan:     PCP Notes   Health Maintenance Deferred flu until Oct  For mammogram later this year  Abnormal Screens  Spouse dying; mother died in 06-Nov-2022; sister unable to assist with estate Spouse may have chemo or other but is refusing treatment. Complicated grief;  Agreed to call Dr. Regis Bill for increase in zoloft if needed; discussed 2 to 4 weeks to be therapeutic Agreed to call the chaplin at the cancer center to talk and ventilate Agreed to call the  doctor at the cancer center to ask about prognosis etc.  Agreed to go back to yoga tomorrow  See notes above   Referrals  As noted  Patient concerns; As noted   Nurse Concerns; As noted   Next PCP apt Nov 6th after spouse has his Pet scan    I have personally reviewed and noted the following in the patient's chart:   . Medical and social history . Use of alcohol, tobacco or illicit drugs  . Current medications and supplements . Functional ability and status . Nutritional status . Physical activity . Advanced directives . List of other physicians . Hospitalizations, surgeries, and ER visits in previous 12 months . Vitals . Screenings to include cognitive, depression, and falls . Referrals and appointments  In addition, I have reviewed and discussed with patient certain preventive protocols, quality metrics, and best practice recommendations. A written personalized care plan for preventive services as well as general preventive health recommendations were provided to patient.     [id number], RN  05/20/2017  I have reviewed this note and agree with its contents.  Alysia Penna, MD

## 2017-05-20 ENCOUNTER — Ambulatory Visit (INDEPENDENT_AMBULATORY_CARE_PROVIDER_SITE_OTHER): Payer: Medicare Other

## 2017-05-20 VITALS — BP 126/70 | HR 61 | Ht 64.5 in | Wt 157.0 lb

## 2017-05-20 DIAGNOSIS — Z0000 Encounter for general adult medical examination without abnormal findings: Secondary | ICD-10-CM | POA: Diagnosis not present

## 2017-05-20 NOTE — Patient Instructions (Addendum)
[name] , Thank you for taking time to come for your Medicare Wellness Visit. I appreciate your ongoing commitment to your health goals. Please review the following plan we discussed and let me know if I can assist you in the future.   Keep in mind the flu shot is an inactivated vaccine and takes at least 2 weeks to build immunity. The flu virus can be dormant for 4 days prior to symptoms Taking the flu shot at the beginning of the season can reduce the risk for the entire community.   To bring Advanced Directive to the office when you see Dr. Peggye Ley.  3 things we discussed 1. Seeing chaplin 2. Exercise  3. Talking to the doctor regarding your concerns   Recommendations for Dexa Scan Female over the age of 10 Man age 95 or older If you broke a bone past the age of 66 Women menopausal age with risk factors (thin frame; smoker; hx of fx ) Post menopausal women under the age of 68 with risk factors A man age 68 to 68 with risk factors Other: Spine xray that is showing break of bone loss Back pain with possible break Height loss of 1/2 inch or more within one year Total loss in height of 1.5 inches from your original height  Calcium 1256m with Vit D 800u per day; more as directed by physician Strength building exercises discussed; can include walking; housework; small weights or stretch bands; silver sneakers if access to the [facility]  Please visit the osteoporosis foundation.org for up to date recommendations  Shingrix is a vaccine for the prevention of Shingles in Adults 50 and older.  If you are on Medicare, you can request a prescription from your doctor to be filled at a pharmacy.  Please check with your benefits regarding applicable copays or out of pocket expenses.  The Shingrix is given in 2 vaccines approx 8 weeks apart. You must receive the 2nd dose prior to 6 months from receipt of the first.     These are the goals we discussed: Goals    . patient          Go to a  yoga class; tomorrow afternoon at 4:30        This is a list of the screening recommended for you and due dates:  Health Maintenance  Topic Date Due  . Flu Shot  03/25/2017  . Mammogram  06/11/2018  . Colon Cancer Screening  07/22/2025  . Tetanus Vaccine  01/13/2027  . DEXA scan (bone density measurement)  Completed  .  Hepatitis C: One time screening is recommended by Center for Disease Control  (CDC) for  adults born from [birth year]through 1965.   Completed  . Pneumonia vaccines  Completed        Fall Prevention in the Home Falls can cause injuries. They can happen to people of all ages. There are many things you can do to make your home safe and to help prevent falls. What can I do on the outside of my home?  Regularly fix the edges of walkways and driveways and fix any cracks.  Remove anything that might make you trip as you walk through a door, such as a raised step or threshold.  Trim any bushes or trees on the path to your home.  Use bright outdoor lighting.  Clear any walking paths of anything that might make someone trip, such as rocks or tools.  Regularly check to see if handrails are  loose or broken. Make sure that both sides of any steps have handrails.  Any raised decks and porches should have guardrails on the edges.  Have any leaves, snow, or ice cleared regularly.  Use sand or salt on walking paths during winter.  Clean up any spills in your garage right away. This includes oil or grease spills. What can I do in the bathroom?  Use night lights.  Install grab bars by the toilet and in the tub and shower. Do not use towel bars as grab bars.  Use non-skid mats or decals in the tub or shower.  If you need to sit down in the shower, use a plastic, non-slip stool.  Keep the floor dry. Clean up any water that spills on the floor as soon as it happens.  Remove soap buildup in the tub or shower regularly.  Attach bath mats securely with double-sided  non-slip rug tape.  Do not have throw rugs and other things on the floor that can make you trip. What can I do in the bedroom?  Use night lights.  Make sure that you have a light by your bed that is easy to reach.  Do not use any sheets or blankets that are too big for your bed. They should not hang down onto the floor.  Have a firm chair that has side arms. You can use this for support while you get dressed.  Do not have throw rugs and other things on the floor that can make you trip. What can I do in the kitchen?  Clean up any spills right away.  Avoid walking on wet floors.  Keep items that you use a lot in easy-to-reach places.  If you need to reach something above you, use a strong step stool that has a grab bar.  Keep electrical cords out of the way.  Do not use floor polish or wax that makes floors slippery. If you must use wax, use non-skid floor wax.  Do not have throw rugs and other things on the floor that can make you trip. What can I do with my stairs?  Do not leave any items on the stairs.  Make sure that there are handrails on both sides of the stairs and use them. Fix handrails that are broken or loose. Make sure that handrails are as long as the stairways.  Check any carpeting to make sure that it is firmly attached to the stairs. Fix any carpet that is loose or worn.  Avoid having throw rugs at the top or bottom of the stairs. If you do have throw rugs, attach them to the floor with carpet tape.  Make sure that you have a light switch at the top of the stairs and the bottom of the stairs. If you do not have them, ask someone to add them for you. What else can I do to help prevent falls?  Wear shoes that: ? Do not have high heels. ? Have rubber bottoms. ? Are comfortable and fit you well. ? Are closed at the toe. Do not wear sandals.  If you use a stepladder: ? Make sure that it is fully opened. Do not climb a closed stepladder. ? Make sure that both  sides of the stepladder are locked into place. ? Ask someone to hold it for you, if possible.  Clearly mark and make sure that you can see: ? Any grab bars or handrails. ? First and last steps. ? Where the edge of  each step is.  Use tools that help you move around (mobility aids) if they are needed. These include: ? Canes. ? Walkers. ? Scooters. ? Crutches.  Turn on the lights when you go into a dark area. Replace any light bulbs as soon as they burn out.  Set up your furniture so you have a clear path. Avoid moving your furniture around.  If any of your floors are uneven, fix them.  If there are any pets around you, be aware of where they are.  Review your medicines with your doctor. Some medicines can make you feel dizzy. This can increase your chance of falling. Ask your doctor what other things that you can do to help prevent falls. This information is not intended to replace advice given to you by your health care provider. Make sure you discuss any questions you have with your health care provider. Document Released: 06/07/2009 Document Revised: 01/17/2016 Document Reviewed: 09/15/2014 Elsevier Interactive Patient Education  2018 [place] Maintenance, Female Adopting a healthy lifestyle and getting preventive care can go a long way to promote health and wellness. Talk with your health care provider about what schedule of regular examinations is right for you. This is a good chance for you to check in with your provider about disease prevention and staying healthy. In between checkups, there are plenty of things you can do on your own. Experts have done a lot of research about which lifestyle changes and preventive measures are most likely to keep you healthy. Ask your health care provider for more information. Weight and diet Eat a healthy diet  Be sure to include plenty of vegetables, fruits, low-fat dairy products, and lean protein.  Do not eat a lot of foods  high in solid fats, added sugars, or salt.  Get regular exercise. This is one of the most important things you can do for your health. ? Most adults should exercise for at least 150 minutes each week. The exercise should increase your heart rate and make you sweat (moderate-intensity exercise). ? Most adults should also do strengthening exercises at least twice a week. This is in addition to the moderate-intensity exercise.  Maintain a healthy weight  Body mass index (BMI) is a measurement that can be used to identify possible weight problems. It estimates body fat based on height and weight. Your health care provider can help determine your BMI and help you achieve or maintain a healthy weight.  For females 77 years of age and older: ? A BMI below 18.5 is considered underweight. ? A BMI of 18.5 to 24.9 is normal. ? A BMI of 25 to 29.9 is considered overweight. ? A BMI of 30 and above is considered obese.  Watch levels of cholesterol and blood lipids  You should start having your blood tested for lipids and cholesterol at 68 years of age, then have this test every 5 years.  You may need to have your cholesterol levels checked more often if: ? Your lipid or cholesterol levels are high. ? You are older than 68 years of age. ? You are at high risk for heart disease.  Cancer screening Lung Cancer  Lung cancer screening is recommended for adults 19-44 years old who are at high risk for lung cancer because of a history of smoking.  A yearly low-dose CT scan of the lungs is recommended for people who: ? Currently smoke. ? Have quit within the past 15 years. ? Have at least a 30-pack-year  history of smoking. A pack year is smoking an average of one pack of cigarettes a day for 1 year.  Yearly screening should continue until it has been 15 years since you quit.  Yearly screening should stop if you develop a health problem that would prevent you from having lung cancer treatment.  Breast  Cancer  Practice breast self-awareness. This means understanding how your breasts normally appear and feel.  It also means doing regular breast self-exams. Let your health care provider know about any changes, no matter how small.  If you are in your 20s or 30s, you should have a clinical breast exam (CBE) by a health care provider every 1-3 years as part of a regular health exam.  If you are 10 or older, have a CBE every year. Also consider having a breast X-ray (mammogram) every year.  If you have a family history of breast cancer, talk to your health care provider about genetic screening.  If you are at high risk for breast cancer, talk to your health care provider about having an MRI and a mammogram every year.  Breast cancer gene (BRCA) assessment is recommended for women who have family members with BRCA-related cancers. BRCA-related cancers include: ? Breast. ? Ovarian. ? Tubal. ? Peritoneal cancers.  Results of the assessment will determine the need for genetic counseling and BRCA1 and BRCA2 testing.  Cervical Cancer Your health care provider may recommend that you be screened regularly for cancer of the pelvic organs (ovaries, uterus, and vagina). This screening involves a pelvic examination, including checking for microscopic changes to the surface of your cervix (Pap test). You may be encouraged to have this screening done every 3 years, beginning at age 30.  For women ages 23-65, health care providers may recommend pelvic exams and Pap testing every 3 years, or they may recommend the Pap and pelvic exam, combined with testing for human papilloma virus (HPV), every 5 years. Some types of HPV increase your risk of cervical cancer. Testing for HPV may also be done on women of any age with unclear Pap test results.  Other health care providers may not recommend any screening for nonpregnant women who are considered low risk for pelvic cancer and who do not have symptoms. Ask your  health care provider if a screening pelvic exam is right for you.  If you have had past treatment for cervical cancer or a condition that could lead to cancer, you need Pap tests and screening for cancer for at least 20 years after your treatment. If Pap tests have been discontinued, your risk factors (such as having a new sexual partner) need to be reassessed to determine if screening should resume. Some women have medical problems that increase the chance of getting cervical cancer. In these cases, your health care provider may recommend more frequent screening and Pap tests.  Colorectal Cancer  This type of cancer can be detected and often prevented.  Routine colorectal cancer screening usually begins at 68 years of age and continues through 68 years of age.  Your health care provider may recommend screening at an earlier age if you have risk factors for colon cancer.  Your health care provider may also recommend using home test kits to check for hidden blood in the stool.  A small camera at the end of a tube can be used to examine your colon directly (sigmoidoscopy or colonoscopy). This is done to check for the earliest forms of colorectal cancer.  Routine screening usually  begins at age 40.  Direct examination of the colon should be repeated every 5-10 years through 68 years of age. However, you may need to be screened more often if early forms of precancerous polyps or small growths are found.  Skin Cancer  Check your skin from head to toe regularly.  Tell your health care provider about any new moles or changes in moles, especially if there is a change in a mole's shape or color.  Also tell your health care provider if you have a mole that is larger than the size of a pencil eraser.  Always use sunscreen. Apply sunscreen liberally and repeatedly throughout the day.  Protect yourself by wearing long sleeves, pants, a wide-brimmed hat, and sunglasses whenever you are  outside.  Heart disease, diabetes, and high blood pressure  High blood pressure causes heart disease and increases the risk of stroke. High blood pressure is more likely to develop in: ? People who have blood pressure in the high end of the normal range (130-139/85-89 mm Hg). ? People who are overweight or obese. ? People who are African American.  If you are 51-58 years of age, have your blood pressure checked every 3-5 years. If you are 62 years of age or older, have your blood pressure checked every year. You should have your blood pressure measured twice-once when you are at a hospital or clinic, and once when you are not at a hospital or clinic. Record the average of the two measurements. To check your blood pressure when you are not at a hospital or clinic, you can use: ? An automated blood pressure machine at a pharmacy. ? A home blood pressure monitor.  If you are between 12 years and 54 years old, ask your health care provider if you should take aspirin to prevent strokes.  Have regular diabetes screenings. This involves taking a blood sample to check your fasting blood sugar level. ? If you are at a normal weight and have a low risk for diabetes, have this test once every three years after 68 years of age. ? If you are overweight and have a high risk for diabetes, consider being tested at a younger age or more often. Preventing infection Hepatitis B  If you have a higher risk for hepatitis B, you should be screened for this virus. You are considered at high risk for hepatitis B if: ? You were born in a country where hepatitis B is common. Ask your health care provider which countries are considered high risk. ? Your parents were born in a high-risk country, and you have not been immunized against hepatitis B (hepatitis B vaccine). ? You have HIV or AIDS. ? You use needles to inject street drugs. ? You live with someone who has hepatitis B. ? You have had sex with someone who has  hepatitis B. ? You get hemodialysis treatment. ? You take certain medicines for conditions, including cancer, organ transplantation, and autoimmune conditions.  Hepatitis C  Blood testing is recommended for: ? Everyone born from [birth year] through 1967. ? Anyone with known risk factors for hepatitis C.  Sexually transmitted infections (STIs)  You should be screened for sexually transmitted infections (STIs) including gonorrhea and chlamydia if: ? You are sexually active and are younger than 68 years of age. ? You are older than 68 years of age and your health care provider tells you that you are at risk for this type of infection. ? Your sexual activity has changed since  you were last screened and you are at an increased risk for chlamydia or gonorrhea. Ask your health care provider if you are at risk.  If you do not have HIV, but are at risk, it may be recommended that you take a prescription medicine daily to prevent HIV infection. This is called pre-exposure prophylaxis (PrEP). You are considered at risk if: ? You are sexually active and do not regularly use condoms or know the HIV status of your partner(s). ? You take drugs by injection. ? You are sexually active with a partner who has HIV.  Talk with your health care provider about whether you are at high risk of being infected with HIV. If you choose to begin PrEP, you should first be tested for HIV. You should then be tested every 3 months for as long as you are taking PrEP. Pregnancy  If you are premenopausal and you may become pregnant, ask your health care provider about preconception counseling.  If you may become pregnant, take 400 to 800 micrograms (mcg) of folic acid every day.  If you want to prevent pregnancy, talk to your health care provider about birth control (contraception). Osteoporosis and menopause  Osteoporosis is a disease in which the bones lose minerals and strength with aging. This can result in serious bone  fractures. Your risk for osteoporosis can be identified using a bone density scan.  If you are 76 years of age or older, or if you are at risk for osteoporosis and fractures, ask your health care provider if you should be screened.  Ask your health care provider whether you should take a calcium or vitamin D supplement to lower your risk for osteoporosis.  Menopause may have certain physical symptoms and risks.  Hormone replacement therapy may reduce some of these symptoms and risks. Talk to your health care provider about whether hormone replacement therapy is right for you. Follow these instructions at home:  Schedule regular health, dental, and eye exams.  Stay current with your immunizations.  Do not use any tobacco products including cigarettes, chewing tobacco, or electronic cigarettes.  If you are pregnant, do not drink alcohol.  If you are breastfeeding, limit how much and how often you drink alcohol.  Limit alcohol intake to no more than 1 drink per day for nonpregnant women. One drink equals 12 ounces of beer, 5 ounces of wine, or 1 ounces of hard liquor.  Do not use street drugs.  Do not share needles.  Ask your health care provider for help if you need support or information about quitting drugs.  Tell your health care provider if you often feel depressed.  Tell your health care provider if you have ever been abused or do not feel safe at home. This information is not intended to replace advice given to you by your health care provider. Make sure you discuss any questions you have with your health care provider. Document Released: 02/24/2011 Document Revised: 01/17/2016 Document Reviewed: 05/15/2015 Elsevier Interactive Patient Education  [employer].

## 2017-05-26 ENCOUNTER — Other Ambulatory Visit: Payer: Self-pay | Admitting: Internal Medicine

## 2017-05-26 DIAGNOSIS — Z1231 Encounter for screening mammogram for malignant neoplasm of breast: Secondary | ICD-10-CM

## 2017-06-12 ENCOUNTER — Ambulatory Visit
Admission: RE | Admit: 2017-06-12 | Discharge: 2017-06-12 | Disposition: A | Discharge status: Home / Self Care | Payer: Medicare Other | Source: Ambulatory Visit | Attending: Internal Medicine | Admitting: Internal Medicine

## 2017-06-12 DIAGNOSIS — Z1231 Encounter for screening mammogram for malignant neoplasm of breast: Secondary | ICD-10-CM

## 2017-06-15 ENCOUNTER — Ambulatory Visit (INDEPENDENT_AMBULATORY_CARE_PROVIDER_SITE_OTHER): Payer: Medicare Other | Admitting: *Deleted

## 2017-06-15 DIAGNOSIS — Z23 Encounter for immunization: Secondary | ICD-10-CM

## 2017-06-23 ENCOUNTER — Other Ambulatory Visit: Payer: Medicare Other

## 2017-06-23 ENCOUNTER — Ambulatory Visit: Payer: Medicare Other

## 2017-06-29 NOTE — Progress Notes (Signed)
Chief Complaint  Patient presents with  . Annual Exam    Discuss medications.     HPI: Pamela [name] 66 y.o. comes in today for Preventive Medicare exam/ med check visit .Since last visit.  Been dealing with her husband's cancer that may have spread and getting a PET scan today.  Also her sister is suicidal and may be eating him with them. She has not been attending to her diet and exercise as much as usual.  She does continue her yoga. She believes that the sertraline is helping her get through these times and baby okay. No other major changes in her health.  Health Maintenance  Topic Date Due  . MAMMOGRAM  06/13/2019  . COLONOSCOPY  07/22/2025  . TETANUS/TDAP  01/13/2027  . INFLUENZA VACCINE  Completed  . DEXA SCAN  Completed  . Hepatitis C Screening  Completed  . PNA vac Low Risk Adult  Completed   Health Maintenance Review LIFESTYLE:  Exercise:   Tobacco/ETS: Alcohol:  Sugar beverages: Sleep:  6-8  Drug use: no HH:  Dog and hh of 2    Hearing: ok  Vision:  No limitations at present . Last eye check UTD  Safety:  Has smoke detector and wears seat belts.  No firearms. No excess sun exposure. Sees dentist regularly.  Falls:  no  Memory: Felt to be good  , no concern from her or her family.  Depression: No anhedonia unusual crying or depressive symptoms  Nutrition: Eats well balanced diet; adequate calcium and vitamin D. No swallowing chewing problems.  Injury: no major injuries in the last six months.  Other healthcare providers:  Reviewed today .  Social:  Lives with spouse married.  Pet dog walks a lot   Preventive parameters: up-to-date  Reviewed   ADLS:   There are no problems or need for assistance  driving, feeding, obtaining food, dressing, toileting and bathing, managing money using phone. She is independent.    ROS:  GEN/ HEENT: No fever, significant weight changes sweats headaches vision problems hearing changes, CV/ PULM; No chest pain  shortness of breath cough, syncope,edema  change in exercise tolerance. GI /GU: No adominal pain, vomiting, change in bowel habits. No blood in the stool. No significant GU symptoms. SKIN/HEME: ,no acute skin rashes suspicious lesions or bleeding. No lymphadenopathy, nodules, masses.  NEURO/ PSYCH:  No neurologic signs such as weakness numbness. No depression anxiety. IMM/ Allergy: No unusual infections.  Allergy .   REST of 12 system review negative except as per HPI   Past Medical History:  Diagnosis Date  . Arthritis KNEES, HANDS, SHOULDERS  . Depression   . Hx of Clostridium difficile infection   . Hyperlipidemia, mixed   . Impingement syndrome of right shoulder   . Intestinal bacterial overgrowth 06/20/2013   Under rx per dr Collene Mares   . Skin cancer    Tonia Brooms   . Vaginitis, atrophic    recurrent    Family History  Problem Relation Age of Onset  . Arthritis Sister         CPPD autoinmmune  . Hyperlipidemia Unknown        family hx  . Hypertension Unknown        family hx  . Sudden death Unknown        family hx  . Heart disease Unknown        family hx  . Multiple myeloma Father        deceased  .  Stroke Mother 53    Social History   Socioeconomic History  . Marital status: Married    Spouse name: None  . Number of children: None  . Years of education: None  . Highest education level: None  Social Needs  . Financial resource strain: None  . Food insecurity - worry: None  . Food insecurity - inability: None  . Transportation needs - medical: None  . Transportation needs - non-medical: None  Occupational History  . Occupation: retired    Fish farm manager: RETIRED  Tobacco Use  . Smoking status: Never Smoker  . Smokeless tobacco: Never Used  Substance and Sexual Activity  . Alcohol use: Yes    Comment: OCCASIONAL  . Drug use: No  . Sexual activity: None  Other Topics Concern  . 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  Has smoke detector and wears seat belts.  No firearms. No excess sun exposure. Sees dentist regularly .   Dexa nl 2005 and 2008    Outpatient Encounter Medications as of 06/30/2017  Medication Sig  . Cholecalciferol (D3-1000 PO) Take 1 capsule by mouth daily.  Marland Kitchen conjugated estrogens (PREMARIN) vaginal cream Use vaginally as directed  . Probiotic Product (PROBIOTIC PO) Take 1 capsule by mouth daily.  . sertraline (ZOLOFT) 50 MG tablet 1 po qd  . aspirin 500 MG tablet Take 500 mg by mouth as needed for pain. Uses for Arthritis  . Zoster Vaccine Adjuvanted [facility]) injection Inject 0.5 mLs once for 1 dose into the muscle. Repeat in 2-6 months  . [DISCONTINUED] amoxicillin-clavulanate (AUGMENTIN) 875-125 MG tablet Take 1 tablet by mouth every 12 (twelve) hours. (Patient not taking: Reported on 06/30/2017)   No facility-administered encounter medications on file as of 06/30/2017.     EXAM:  BP 118/62 (BP Location: Right Arm, Patient Position: Sitting, Cuff Size: Normal)   Pulse 62   Temp 98.4 F (36.9 C) (Oral)   Ht _0  (1.626 m)   Wt 159 lb 4.8 oz (72.3 kg)   BMI 27.34 kg/m   Body mass index is 27.34 kg/m.  Physical Exam: Vital signs reviewed KZL:DJTT is a well-developed well-nourished alert cooperative   who appears stated age in no acute distress.  HEENT: normocephalic atraumatic , Eyes: PERRL EOM's full, conjunctiva clear, Nares: paten,t no deformity discharge or tenderness., Ears: no deformity EAC's clear TMs with normal landmarks. Mouth: clear OP, no lesions, edema.  Moist mucous membranes. Dentition in adequate repair. NECK: supple without masses, thyromegaly or bruits. CHEST/PULM:  Clear to auscultation and percussion breath sounds equal no wheeze , rales or rhonchi. No chest wall deformities or tenderness.Breast: normal by inspection . No dimpling, discharge, masses, tenderness or discharge . CV: PMI is  nondisplaced, S1 S2 no gallops, murmurs, rubs. Peripheral pulses are full without delay.No JVD .  ABDOMEN: Bowel sounds normal nontender  No guard or rebound, no hepato splenomegal no CVA tenderness.   Extremtities:  No clubbing cyanosis or edema, no acute joint swelling or redness no focal atrophy NEURO:  Oriented x3, cranial nerves 3-12 appear to be intact, no obvious focal weakness,gait within normal limits no abnormal reflexes or asymmetrical SKIN: No acute rashes normal turgor, color, no bruising or petechiae. PSYCH: Oriented, good eye contact, no obvious depression anxiety, cognition and judgment appear normal. LN: no cervical  axillary inguinal adenopathy No noted deficits in memory, attention, and speech.   Lab Results  Component Value Date   WBC 4.7 06/20/2016   HGB 12.3 06/20/2016   HCT 36.6 06/20/2016   PLT 231.0 06/20/2016   GLUCOSE 99 06/30/2017   CHOL 269 (H) 06/30/2017   TRIG 112.0 06/30/2017   HDL 70.10 06/30/2017   LDLDIRECT 173.5 06/13/2013   LDLCALC 176 (H) 06/30/2017   ALT 15 06/30/2017   AST 17 06/30/2017   NA 137 06/30/2017   K 4.3 06/30/2017   CL 103 06/30/2017   CREATININE 0.80 06/30/2017   BUN 19 06/30/2017   CO2 29 06/30/2017   TSH 1.82 06/20/2016    ASSESSMENT AND PLAN:  Discussed the following assessment and plan:  Visit for preventive health examination - Plan: Lipid panel, Basic metabolic panel, Hepatic function panel  HYPERLIPIDEMIA - Plan: Lipid panel, Basic metabolic panel, Hepatic function panel  Medication management - Plan: Lipid panel, Basic metabolic panel, Hepatic function panel  Adjustment disorder with other symptom - Plan: Lipid panel, Basic metabolic panel, Hepatic function panel  Stress due to illness of family member Going through stress of family illness situational.  Sertraline seems to be at an okay dose at this time she will get back with [place] if needed more help in this area. Lipid panel today realizing she may need to  intensify her lifestyle interventions which is followed by the [facility] with her family stress. Discussed shingles vaccine can get when appropriate or convenient. Patient Care Team: Carmita Boom, Standley Brooking, MD as PCP - General Paralee Cancel, MD (Orthopedic Surgery) Crista Luria, MD (Dermatology) Juanita Craver, MD as Attending Physician (Gastroenterology) Paralee Cancel, MD as Consulting Physician (Orthopedic Surgery)  Patient Instructions  Continue on sertraline  As planned.   Get out and active as you are planning .   Will notify you  of labs when available.  Shingles vaccine  At  Pharmacy .   If allok then yearly cpx and lab monitoring    Preventive Care 13 Years and Older, Female Preventive care refers to lifestyle choices and visits with your health care provider that can promote health and wellness. What does preventive care include?  A yearly physical exam. This is also called an annual well check.  Dental exams once or twice a year.  Routine eye exams. Ask your health care provider how often you should have your eyes checked.  Personal lifestyle choices, including: ? Daily care of your teeth and gums. ? Regular physical activity. ? Eating a healthy diet. ? Avoiding tobacco and drug use. ? Limiting alcohol use. ? Practicing safe sex. ? Taking low-dose aspirin every day. ? Taking vitamin and mineral supplements as recommended by your health care provider. What happens during an annual well check? The services and screenings done by your health care provider during your annual well check will depend on your age, overall health, lifestyle risk factors, and family history of disease. Counseling Your health care provider may ask you questions about your:  Alcohol use.  Tobacco use.  Drug use.  Emotional well-being.  Home and relationship well-being.  Sexual activity.  Eating habits.  History of falls.  Memory and ability to understand (cognition).  Work and work  Statistician.  Reproductive health.  Screening You may have the following tests or measurements:  Height, weight, and BMI.  Blood pressure.  Lipid and cholesterol levels. These may be checked every 5 years, or more frequently if you are over 12 years old.  Skin check.  Lung cancer screening. You may have this screening every year starting at age 57 if you have a 30-pack-year history of smoking and currently smoke or have quit within the past 15 years.  Fecal occult blood test (FOBT) of the stool. You may have this test every year starting at age 38.  Flexible sigmoidoscopy or colonoscopy. You may have a sigmoidoscopy every 5 years or a colonoscopy every 10 years starting at age 83.  Hepatitis C blood test.  Hepatitis B blood test.  Sexually transmitted disease (STD) testing.  Diabetes screening. This is done by checking your blood sugar (glucose) after you have not eaten for a while (fasting). You may have this done every 1-3 years.  Bone density scan. This is done to screen for osteoporosis. You may have this done starting at age 15.  Mammogram. This may be done every 1-2 years. Talk to your health care provider about how often you should have regular mammograms.  Talk with your health care provider about your test results, treatment options, and if necessary, the need for more tests. Vaccines Your health care provider may recommend certain vaccines, such as:  Influenza vaccine. This is recommended every year.  Tetanus, diphtheria, and acellular pertussis (Tdap, Td) vaccine. You may need a Td booster every 10 years.  Varicella vaccine. You may need this if you have not been vaccinated.  Zoster vaccine. You may need this after age 55.  Measles, mumps, and rubella (MMR) vaccine. You may need at least one dose of MMR if you were born in 1957 or later. You may also need a second dose.  Pneumococcal 13-valent conjugate (PCV13) vaccine. One dose is recommended after age  4.  Pneumococcal polysaccharide (PPSV23) vaccine. One dose is recommended after age 56.  Meningococcal vaccine. You may need this if you have certain conditions.  Hepatitis A vaccine. You may need this if you have certain conditions or if you travel or work in places where you may be exposed to hepatitis A.  Hepatitis B vaccine. You may need this if you have certain conditions or if you travel or work in places where you may be exposed to hepatitis B.  Haemophilus influenzae type b (Hib) vaccine. You may need this if you have certain conditions.  Talk to your health care provider about which screenings and vaccines you need and how often you need them. This information is not intended to replace advice given to you by your health care provider. Make sure you discuss any questions you have with your health care provider. Document Released: 09/07/2015 Document Revised: 04/30/2016 Document Reviewed: 06/12/2015 Elsevier Interactive Patient Education  2017 [place]. Regino Fournet M.D.

## 2017-06-30 ENCOUNTER — Ambulatory Visit (INDEPENDENT_AMBULATORY_CARE_PROVIDER_SITE_OTHER): Payer: Medicare Other | Admitting: Internal Medicine

## 2017-06-30 ENCOUNTER — Encounter: Payer: Self-pay | Admitting: Internal Medicine

## 2017-06-30 VITALS — BP 118/62 | HR 62 | Temp 98.4°F | Ht 64.0 in | Wt 159.3 lb

## 2017-06-30 DIAGNOSIS — Z79899 Other long term (current) drug therapy: Secondary | ICD-10-CM

## 2017-06-30 DIAGNOSIS — Z6379 Other stressful life events affecting family and household: Secondary | ICD-10-CM | POA: Diagnosis not present

## 2017-06-30 DIAGNOSIS — F4329 Adjustment disorder with other symptoms: Secondary | ICD-10-CM

## 2017-06-30 DIAGNOSIS — E782 Mixed hyperlipidemia: Secondary | ICD-10-CM

## 2017-06-30 DIAGNOSIS — Z0000 Encounter for general adult medical examination without abnormal findings: Secondary | ICD-10-CM

## 2017-06-30 LAB — HEPATIC FUNCTION PANEL
ALK PHOS: 59 U/L (ref 39–117)
ALT: 15 U/L (ref 0–35)
AST: 17 U/L (ref 0–37)
Albumin: 4.4 g/dL (ref 3.5–5.2)
BILIRUBIN DIRECT: 0.1 mg/dL (ref 0.0–0.3)
BILIRUBIN TOTAL: 0.5 mg/dL (ref 0.2–1.2)
Total Protein: 6.5 g/dL (ref 6.0–8.3)

## 2017-06-30 LAB — BASIC METABOLIC PANEL
BUN: 19 mg/dL (ref 6–23)
CHLORIDE: 103 meq/L (ref 96–112)
CO2: 29 mEq/L (ref 19–32)
Calcium: 9.4 mg/dL (ref 8.4–10.5)
Creatinine, Ser: 0.8 mg/dL (ref 0.40–1.20)
GFR: 75.78 mL/min (ref >60.00)
Glucose, Bld: 99 mg/dL (ref 70–99)
POTASSIUM: 4.3 meq/L (ref 3.5–5.1)
SODIUM: 137 meq/L (ref 135–145)

## 2017-06-30 LAB — LIPID PANEL
CHOL/HDL RATIO: 4
CHOLESTEROL: 269 mg/dL — AB (ref 0–200)
HDL: 70.1 mg/dL (ref >39.00)
LDL CALC: 176 mg/dL — AB (ref 0–99)
NonHDL: 198.85
TRIGLYCERIDES: 112 mg/dL (ref 0.0–149.0)
VLDL: 22.4 mg/dL (ref 0.0–40.0)

## 2017-06-30 MED ORDER — ZOSTER VAC RECOMB ADJUVANTED 50 MCG/0.5ML IM SUSR: 0.5000 mL | Freq: Once | INTRAMUSCULAR | 1 refills | Status: AC

## 2017-06-30 NOTE — Patient Instructions (Signed)
Continue on sertraline  As planned.   Get out and active as you are planning .   Will notify you  of labs when available.  Shingles vaccine  At  Pharmacy .   If allok then yearly cpx and lab monitoring    Preventive Care 68 Years and Older, Female Preventive care refers to lifestyle choices and visits with your health care provider that can promote health and wellness. What does preventive care include?  A yearly physical exam. This is also called an annual well check.  Dental exams once or twice a year.  Routine eye exams. Ask your health care provider how often you should have your eyes checked.  Personal lifestyle choices, including: ? Daily care of your teeth and gums. ? Regular physical activity. ? Eating a healthy diet. ? Avoiding tobacco and drug use. ? Limiting alcohol use. ? Practicing safe sex. ? Taking low-dose aspirin every day. ? Taking vitamin and mineral supplements as recommended by your health care provider. What happens during an annual well check? The services and screenings done by your health care provider during your annual well check will depend on your age, overall health, lifestyle risk factors, and family history of disease. Counseling Your health care provider may ask you questions about your:  Alcohol use.  Tobacco use.  Drug use.  Emotional well-being.  Home and relationship well-being.  Sexual activity.  Eating habits.  History of falls.  Memory and ability to understand (cognition).  Work and work Statistician.  Reproductive health.  Screening You may have the following tests or measurements:  Height, weight, and BMI.  Blood pressure.  Lipid and cholesterol levels. These may be checked every 5 years, or more frequently if you are over 72 years old.  Skin check.  Lung cancer screening. You may have this screening every year starting at age 50 if you have a 30-pack-year history of smoking and currently smoke or have quit  within the past 15 years.  Fecal occult blood test (FOBT) of the stool. You may have this test every year starting at age 63.  Flexible sigmoidoscopy or colonoscopy. You may have a sigmoidoscopy every 5 years or a colonoscopy every 10 years starting at age 65.  Hepatitis C blood test.  Hepatitis B blood test.  Sexually transmitted disease (STD) testing.  Diabetes screening. This is done by checking your blood sugar (glucose) after you have not eaten for a while (fasting). You may have this done every 1-3 years.  Bone density scan. This is done to screen for osteoporosis. You may have this done starting at age 11.  Mammogram. This may be done every 1-2 years. Talk to your health care provider about how often you should have regular mammograms.  Talk with your health care provider about your test results, treatment options, and if necessary, the need for more tests. Vaccines Your health care provider may recommend certain vaccines, such as:  Influenza vaccine. This is recommended every year.  Tetanus, diphtheria, and acellular pertussis (Tdap, Td) vaccine. You may need a Td booster every 10 years.  Varicella vaccine. You may need this if you have not been vaccinated.  Zoster vaccine. You may need this after age 37.  Measles, mumps, and rubella (MMR) vaccine. You may need at least one dose of MMR if you were born in 1957 or later. You may also need a second dose.  Pneumococcal 13-valent conjugate (PCV13) vaccine. One dose is recommended after age 74.  Pneumococcal polysaccharide (PPSV23)  vaccine. One dose is recommended after age 68.  Meningococcal vaccine. You may need this if you have certain conditions.  Hepatitis A vaccine. You may need this if you have certain conditions or if you travel or work in places where you may be exposed to hepatitis A.  Hepatitis B vaccine. You may need this if you have certain conditions or if you travel or work in places where you may be exposed  to hepatitis B.  Haemophilus influenzae type b (Hib) vaccine. You may need this if you have certain conditions.  Talk to your health care provider about which screenings and vaccines you need and how often you need them. This information is not intended to replace advice given to you by your health care provider. Make sure you discuss any questions you have with your health care provider. Document Released: 09/07/2015 Document Revised: 04/30/2016 Document Reviewed: 06/12/2015 Elsevier Interactive Patient Education  2017 [employer].

## 2017-07-07 ENCOUNTER — Other Ambulatory Visit: Payer: Self-pay | Admitting: Internal Medicine

## 2017-08-22 ENCOUNTER — Other Ambulatory Visit: Payer: Self-pay | Admitting: Internal Medicine

## 2017-12-04 ENCOUNTER — Ambulatory Visit (INDEPENDENT_AMBULATORY_CARE_PROVIDER_SITE_OTHER): Payer: Medicare Other

## 2017-12-04 VITALS — BP 120/70 | HR 68 | Ht 64.5 in | Wt 163.0 lb

## 2017-12-04 DIAGNOSIS — Z0000 Encounter for general adult medical examination without abnormal findings: Secondary | ICD-10-CM

## 2017-12-04 NOTE — Patient Instructions (Addendum)
Pamela [name] , Thank you for taking time to come for your Medicare Wellness Visit. I appreciate your ongoing commitment to your health goals. Please review the following plan we discussed and let me know if I can assist you in the future.   May want to call Hospice and Palliative Care in [facility] Address: [address] [place], [place] [address]  Hours:  Open ? Closes 5PM   Phone: [phone]  Shingrix is a vaccine for the prevention of Shingles in Adults 50 and older.  If you are on Medicare, the shingrix is covered under your Part D plan, so you will take both of the vaccines in the series at your pharmacy. Please check with your benefits regarding applicable copays or out of pocket expenses.  The Shingrix is given in 2 vaccines approx 8 weeks apart. You must receive the 2nd dose prior to 6 months from receipt of the first. Please have the pharmacist print out you Immunization  dates for our office records    These are the goals we discussed: Goals    . Patient Stated     Try to find some personal time very day;  Try to coordinate friendly visits May discuss with palliative care they do as well as meet with them yourself   May consider a massage! Write down things that you feel would be helpful May take a class at [facility] with the flow without a paddle.       This is a list of the screening recommended for you and due dates:  Health Maintenance  Topic Date Due  . Flu Shot  03/25/2018  . Mammogram  06/13/2019  . Colon Cancer Screening  07/22/2025  . Tetanus Vaccine  01/13/2027  . DEXA scan (bone density measurement)  Completed  .  Hepatitis C: One time screening is recommended by Center for Disease Control  (CDC) for  adults born from [birth year] through 1967.   Completed  . Pneumonia vaccines  Completed      Fall Prevention in the Home Falls can cause injuries. They can happen to people of all ages. There are many things you can do to make your home safe and to  help prevent falls. What can I do on the outside of my home?  Regularly fix the edges of walkways and driveways and fix any cracks.  Remove anything that might make you trip as you walk through a door, such as a raised step or threshold.  Trim any bushes or trees on the path to your home.  Use bright outdoor lighting.  Clear any walking paths of anything that might make someone trip, such as rocks or tools.  Regularly check to see if handrails are loose or broken. Make sure that both sides of any steps have handrails.  Any raised decks and porches should have guardrails on the edges.  Have any leaves, snow, or ice cleared regularly.  Use sand or salt on walking paths during winter.  Clean up any spills in your garage right away. This includes oil or grease spills. What can I do in the bathroom?  Use night lights.  Install grab bars by the toilet and in the tub and shower. Do not use towel bars as grab bars.  Use non-skid mats or decals in the tub or shower.  If you need to sit down in the shower, use a plastic, non-slip stool.  Keep the floor dry. Clean up any water that spills on  the floor as soon as it happens.  Remove soap buildup in the tub or shower regularly.  Attach bath mats securely with double-sided non-slip rug tape.  Do not have throw rugs and other things on the floor that can make you trip. What can I do in the bedroom?  Use night lights.  Make sure that you have a light by your bed that is easy to reach.  Do not use any sheets or blankets that are too big for your bed. They should not hang down onto the floor.  Have a firm chair that has side arms. You can use this for support while you get dressed.  Do not have throw rugs and other things on the floor that can make you trip. What can I do in the kitchen?  Clean up any spills right away.  Avoid walking on wet floors.  Keep items that you use a lot in easy-to-reach places.  If you need to reach  something above you, use a strong step stool that has a grab bar.  Keep electrical cords out of the way.  Do not use floor polish or wax that makes floors slippery. If you must use wax, use non-skid floor wax.  Do not have throw rugs and other things on the floor that can make you trip. What can I do with my stairs?  Do not leave any items on the stairs.  Make sure that there are handrails on both sides of the stairs and use them. Fix handrails that are broken or loose. Make sure that handrails are as long as the stairways.  Check any carpeting to make sure that it is firmly attached to the stairs. Fix any carpet that is loose or worn.  Avoid having throw rugs at the top or bottom of the stairs. If you do have throw rugs, attach them to the floor with carpet tape.  Make sure that you have a light switch at the top of the stairs and the bottom of the stairs. If you do not have them, ask someone to add them for you. What else can I do to help prevent falls?  Wear shoes that: ? Do not have high heels. ? Have rubber bottoms. ? Are comfortable and fit you well. ? Are closed at the toe. Do not wear sandals.  If you use a stepladder: ? Make sure that it is fully opened. Do not climb a closed stepladder. ? Make sure that both sides of the stepladder are locked into place. ? Ask someone to hold it for you, if possible.  Clearly mark and make sure that you can see: ? Any grab bars or handrails. ? First and last steps. ? Where the edge of each step is.  Use tools that help you move around (mobility aids) if they are needed. These include: ? Canes. ? Walkers. ? Scooters. ? Crutches.  Turn on the lights when you go into a dark area. Replace any light bulbs as soon as they burn out.  Set up your furniture so you have a clear path. Avoid moving your furniture around.  If any of your floors are uneven, fix them.  If there are any pets around you, be aware of where they are.  Review  your medicines with your doctor. Some medicines can make you feel dizzy. This can increase your chance of falling. Ask your doctor what other things that you can do to help prevent falls. This information is not intended to  replace advice given to you by your health care provider. Make sure you discuss any questions you have with your health care provider. Document Released: 06/07/2009 Document Revised: 01/17/2016 Document Reviewed: 09/15/2014 Elsevier Interactive Patient Education  2018 [place] Maintenance, Female Adopting a healthy lifestyle and getting preventive care can go a long way to promote health and wellness. Talk with your health care provider about what schedule of regular examinations is right for you. This is a good chance for you to check in with your provider about disease prevention and staying healthy. In between checkups, there are plenty of things you can do on your own. Experts have done a lot of research about which lifestyle changes and preventive measures are most likely to keep you healthy. Ask your health care provider for more information. Weight and diet Eat a healthy diet  Be sure to include plenty of vegetables, fruits, low-fat dairy products, and lean protein.  Do not eat a lot of foods high in solid fats, added sugars, or salt.  Get regular exercise. This is one of the most important things you can do for your health. ? Most adults should exercise for at least 150 minutes each week. The exercise should increase your heart rate and make you sweat (moderate-intensity exercise). ? Most adults should also do strengthening exercises at least twice a week. This is in addition to the moderate-intensity exercise.  Maintain a healthy weight  Body mass index (BMI) is a measurement that can be used to identify possible weight problems. It estimates body fat based on height and weight. Your health care provider can help determine your BMI and help you achieve  or maintain a healthy weight.  For females 81 years of age and older: ? A BMI below 18.5 is considered underweight. ? A BMI of 18.5 to 24.9 is normal. ? A BMI of 25 to 29.9 is considered overweight. ? A BMI of 30 and above is considered obese.  Watch levels of cholesterol and blood lipids  You should start having your blood tested for lipids and cholesterol at 69 years of age, then have this test every 5 years.  You may need to have your cholesterol levels checked more often if: ? Your lipid or cholesterol levels are high. ? You are older than 69 years of age. ? You are at high risk for heart disease.  Cancer screening Lung Cancer  Lung cancer screening is recommended for adults 45-4 years old who are at high risk for lung cancer because of a history of smoking.  A yearly low-dose CT scan of the lungs is recommended for people who: ? Currently smoke. ? Have quit within the past 15 years. ? Have at least a 30-pack-year history of smoking. A pack year is smoking an average of one pack of cigarettes a day for 1 year.  Yearly screening should continue until it has been 15 years since you quit.  Yearly screening should stop if you develop a health problem that would prevent you from having lung cancer treatment.  Breast Cancer  Practice breast self-awareness. This means understanding how your breasts normally appear and feel.  It also means doing regular breast self-exams. Let your health care provider know about any changes, no matter how small.  If you are in your 20s or 30s, you should have a clinical breast exam (CBE) by a health care provider every 1-3 years as part of a regular health exam.  If you are 40 or  older, have a CBE every year. Also consider having a breast X-ray (mammogram) every year.  If you have a family history of breast cancer, talk to your health care provider about genetic screening.  If you are at high risk for breast cancer, talk to your health care  provider about having an MRI and a mammogram every year.  Breast cancer gene (BRCA) assessment is recommended for women who have family members with BRCA-related cancers. BRCA-related cancers include: ? Breast. ? Ovarian. ? Tubal. ? Peritoneal cancers.  Results of the assessment will determine the need for genetic counseling and BRCA1 and BRCA2 testing.  Cervical Cancer Your health care provider may recommend that you be screened regularly for cancer of the pelvic organs (ovaries, uterus, and vagina). This screening involves a pelvic examination, including checking for microscopic changes to the surface of your cervix (Pap test). You may be encouraged to have this screening done every 3 years, beginning at age 86.  For women ages 46-65, health care providers may recommend pelvic exams and Pap testing every 3 years, or they may recommend the Pap and pelvic exam, combined with testing for human papilloma virus (HPV), every 5 years. Some types of HPV increase your risk of cervical cancer. Testing for HPV may also be done on women of any age with unclear Pap test results.  Other health care providers may not recommend any screening for nonpregnant women who are considered low risk for pelvic cancer and who do not have symptoms. Ask your health care provider if a screening pelvic exam is right for you.  If you have had past treatment for cervical cancer or a condition that could lead to cancer, you need Pap tests and screening for cancer for at least 20 years after your treatment. If Pap tests have been discontinued, your risk factors (such as having a new sexual partner) need to be reassessed to determine if screening should resume. Some women have medical problems that increase the chance of getting cervical cancer. In these cases, your health care provider may recommend more frequent screening and Pap tests.  Colorectal Cancer  This type of cancer can be detected and often prevented.  Routine  colorectal cancer screening usually begins at 69 years of age and continues through 69 years of age.  Your health care provider may recommend screening at an earlier age if you have risk factors for colon cancer.  Your health care provider may also recommend using home test kits to check for hidden blood in the stool.  A small camera at the end of a tube can be used to examine your colon directly (sigmoidoscopy or colonoscopy). This is done to check for the earliest forms of colorectal cancer.  Routine screening usually begins at age 54.  Direct examination of the colon should be repeated every 5-10 years through 69 years of age. However, you may need to be screened more often if early forms of precancerous polyps or small growths are found.  Skin Cancer  Check your skin from head to toe regularly.  Tell your health care provider about any new moles or changes in moles, especially if there is a change in a mole's shape or color.  Also tell your health care provider if you have a mole that is larger than the size of a pencil eraser.  Always use sunscreen. Apply sunscreen liberally and repeatedly throughout the day.  Protect yourself by wearing long sleeves, pants, a wide-brimmed hat, and sunglasses whenever you are outside.  Heart disease, diabetes, and high blood pressure  High blood pressure causes heart disease and increases the risk of stroke. High blood pressure is more likely to develop in: ? People who have blood pressure in the high end of the normal range (130-139/85-89 mm Hg). ? People who are overweight or obese. ? People who are African American.  If you are 95-66 years of age, have your blood pressure checked every 3-5 years. If you are 46 years of age or older, have your blood pressure checked every year. You should have your blood pressure measured twice-once when you are at a hospital or clinic, and once when you are not at a hospital or clinic. Record the average of the  two measurements. To check your blood pressure when you are not at a hospital or clinic, you can use: ? An automated blood pressure machine at a pharmacy. ? A home blood pressure monitor.  If you are between 34 years and 67 years old, ask your health care provider if you should take aspirin to prevent strokes.  Have regular diabetes screenings. This involves taking a blood sample to check your fasting blood sugar level. ? If you are at a normal weight and have a low risk for diabetes, have this test once every three years after 69 years of age. ? If you are overweight and have a high risk for diabetes, consider being tested at a younger age or more often. Preventing infection Hepatitis B  If you have a higher risk for hepatitis B, you should be screened for this virus. You are considered at high risk for hepatitis B if: ? You were born in a country where hepatitis B is common. Ask your health care provider which countries are considered high risk. ? Your parents were born in a high-risk country, and you have not been immunized against hepatitis B (hepatitis B vaccine). ? You have HIV or AIDS. ? You use needles to inject street drugs. ? You live with someone who has hepatitis B. ? You have had sex with someone who has hepatitis B. ? You get hemodialysis treatment. ? You take certain medicines for conditions, including cancer, organ transplantation, and autoimmune conditions.  Hepatitis C  Blood testing is recommended for: ? Everyone born from [birth year] through 1967. ? Anyone with known risk factors for hepatitis C.  Sexually transmitted infections (STIs)  You should be screened for sexually transmitted infections (STIs) including gonorrhea and chlamydia if: ? You are sexually active and are younger than 69 years of age. ? You are older than 69 years of age and your health care provider tells you that you are at risk for this type of infection. ? Your sexual activity has changed since you  were last screened and you are at an increased risk for chlamydia or gonorrhea. Ask your health care provider if you are at risk.  If you do not have HIV, but are at risk, it may be recommended that you take a prescription medicine daily to prevent HIV infection. This is called pre-exposure prophylaxis (PrEP). You are considered at risk if: ? You are sexually active and do not regularly use condoms or know the HIV status of your partner(s). ? You take drugs by injection. ? You are sexually active with a partner who has HIV.  Talk with your health care provider about whether you are at high risk of being infected with HIV. If you choose to begin PrEP, you should first be tested for HIV.  You should then be tested every 3 months for as long as you are taking PrEP. Pregnancy  If you are premenopausal and you may become pregnant, ask your health care provider about preconception counseling.  If you may become pregnant, take 400 to 800 micrograms (mcg) of folic acid every day.  If you want to prevent pregnancy, talk to your health care provider about birth control (contraception). Osteoporosis and menopause  Osteoporosis is a disease in which the bones lose minerals and strength with aging. This can result in serious bone fractures. Your risk for osteoporosis can be identified using a bone density scan.  If you are 28 years of age or older, or if you are at risk for osteoporosis and fractures, ask your health care provider if you should be screened.  Ask your health care provider whether you should take a calcium or vitamin D supplement to lower your risk for osteoporosis.  Menopause may have certain physical symptoms and risks.  Hormone replacement therapy may reduce some of these symptoms and risks. Talk to your health care provider about whether hormone replacement therapy is right for you. Follow these instructions at home:  Schedule regular health, dental, and eye exams.  Stay current  with your immunizations.  Do not use any tobacco products including cigarettes, chewing tobacco, or electronic cigarettes.  If you are pregnant, do not drink alcohol.  If you are breastfeeding, limit how much and how often you drink alcohol.  Limit alcohol intake to no more than 1 drink per day for nonpregnant women. One drink equals 12 ounces of beer, 5 ounces of wine, or 1 ounces of hard liquor.  Do not use street drugs.  Do not share needles.  Ask your health care provider for help if you need support or information about quitting drugs.  Tell your health care provider if you often feel depressed.  Tell your health care provider if you have ever been abused or do not feel safe at home. This information is not intended to replace advice given to you by your health care provider. Make sure you discuss any questions you have with your health care provider. Document Released: 02/24/2011 Document Revised: 01/17/2016 Document Reviewed: 05/15/2015 Elsevier Interactive Patient Education  [employer].

## 2017-12-04 NOTE — Progress Notes (Addendum)
Subjective:   Pamela [name] is a 67 y.o. female who presents for Medicare Annual (Subsequent) preventive examination.  Reports health as challenged  Lost mother x 1 yo Spouse has throat cancer mets to liver  anaphylactic reaction to first chemo States feels most of it is palliative Wife notes he is changing  Every one feels she is doing well, but she is not as sure Her family is in [place]  Explained hospice does palliative care. May call Hospice and discuss palliative care  Nephew is a doctor and "good" at Palliative 73 Diet Eating is her escape; knows she is eating chocolate;  Will try to cut back and develop a list of ways to manage stress through writing and other     BMI 27.5  Exercise Try to get back to your yoga  There are no preventive care reminders to display for this patient. Qualifies for Shingles Vaccine- Educated  Dexa 06/2015 -0.7  Exercise: yoga  Trying to walk the dog     Objective:     Vitals: BP 120/70   Pulse 68   Ht 5' 4.5" (1.638 m)   Wt 163 lb (73.9 kg)   SpO2 98%   BMI 27.55 kg/m   Body mass index is 27.55 kg/m.  Advanced Directives 12/04/2017 05/20/2017 08/06/2012  Does Patient Have a Medical Advance Directive? Yes Yes Patient would like information  Would patient like information on creating a medical advance directive? - - Advance directive packet given    Tobacco Social History   Tobacco Use  Smoking Status Never Smoker  Smokeless Tobacco Never Used     Counseling given: Yes   Clinical Intake:     Past Medical History:  Diagnosis Date  . Arthritis KNEES, HANDS, SHOULDERS  . Depression   . Hx of Clostridium difficile infection   . Hyperlipidemia, mixed   . Impingement syndrome of right shoulder   . Intestinal bacterial overgrowth 06/20/2013   Under rx per dr Collene Mares   . Skin cancer    Tonia Brooms   . Vaginitis, atrophic    recurrent   Past Surgical History:  Procedure Laterality Date  . ABDOMINAL HYSTERECTOMY  1992   W/ BILATERAL SALPINGO-OOPHORECTOMY  . CARPAL TUNNEL RELEASE    . SHOULDER ARTHROSCOPY WITH ROTATOR CUFF REPAIR  08/06/2012   Procedure: SHOULDER ARTHROSCOPY WITH ROTATOR CUFF REPAIR;  Surgeon: Magnus Sinning, MD;  Location: [facility];  Service: Orthopedics;  Laterality: Right;  . SHOULDER ARTHROSCOPY WITH SUBACROMIAL DECOMPRESSION  08/06/2012   Procedure: SHOULDER ARTHROSCOPY WITH SUBACROMIAL DECOMPRESSION;  Surgeon: Magnus Sinning, MD;  Location: [facility];  Service: Orthopedics;  Laterality: Right;  debridement of labral  . TONSILLECTOMY  AS CHILD   Family History  Problem Relation Age of Onset  . Arthritis Sister         CPPD autoinmmune  . Hyperlipidemia Unknown        family hx  . Hypertension Unknown        family hx  . Sudden death Unknown        family hx  . Heart disease Unknown        family hx  . Multiple myeloma Father        deceased  . Stroke Mother 76   Social History   Socioeconomic History  . Marital status: Married    Spouse name: Not on file  . Number of children: Not on file  . Years of education: Not on  file  . Highest education level: Not on file  Occupational History  . Occupation: retired    Fish farm manager: RETIRED  Social Needs  . Financial resource strain: Not on file  . Food insecurity:    Worry: Not on file    Inability: Not on file  . Transportation needs:    Medical: Not on file    Non-medical: Not on file  Tobacco Use  . Smoking status: Never Smoker  . Smokeless tobacco: Never Used  Substance and Sexual Activity  . Alcohol use: Yes    Comment: OCCASIONAL  . Drug use: No  . Sexual activity: Not on file  Lifestyle  . Physical activity:    Days per week: Not on file    Minutes per session: Not on file  . Stress: Not on file  Relationships  . Social connections:    Talks on phone: Not on file    Gets together: Not on file    Attends religious service: Not on file    Active member of club or  organization: Not on file    Attends meetings of clubs or organizations: Not on file    Relationship status: Not on file  Other Topics Concern  . 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  Has smoke detector and wears seat belts.  No firearms. No excess sun exposure. Sees dentist regularly .   Dexa nl 2005 and 2008    Outpatient Encounter Medications as of 12/04/2017  Medication Sig  . aspirin 500 MG tablet Take 500 mg by mouth as needed for pain. Uses for Arthritis  . Cholecalciferol (D3-1000 PO) Take 1 capsule by mouth daily.  Marland Kitchen conjugated estrogens (PREMARIN) vaginal cream USE VAGINALLY AS DIRECTED  . Probiotic Product (PROBIOTIC PO) Take 1 capsule by mouth daily.  . sertraline (ZOLOFT) 50 MG tablet TAKE ONE TABLET BY MOUTH DAILY   No facility-administered encounter medications on file as of 12/04/2017.     Activities of Daily Living In your present state of health, do you have any difficulty performing the following activities: 12/04/2017 06/30/2017  Hearing? N N  Vision? N N  Difficulty concentrating or making decisions? N N  Comment under stress due to illness of spouse -  Walking or climbing stairs? N N  Dressing or bathing? N N  Doing errands, shopping? N N  Preparing Food and eating ? N -  Using the Toilet? N -  In the past six months, have you accidently leaked urine? N -  Do you have problems with loss of bowel control? N -  Managing your Medications? N -  Managing your Finances? N -  Housekeeping or managing your Housekeeping? N -  Some recent data might be hidden    Patient Care Team: Panosh, Standley Brooking, MD as PCP - General Paralee Cancel, MD (Orthopedic Surgery) Crista Luria, MD (Dermatology) Juanita Craver, MD as Attending Physician (Gastroenterology) Paralee Cancel, MD as Consulting Physician (Orthopedic Surgery)    Assessment:   This is a routine  wellness examination for Pamela [name].  Exercise Activities and Dietary recommendations Current Exercise Habits: Home exercise routine  Goals    . Patient Stated     Try to find some personal time very day;  Try to coordinate friendly visits May discuss with palliative care they do  as well as meet with them yourself   May consider a massage! Write down things that you feel would be helpful May take a class at [place] with the flow without a paddle.       Fall Risk Fall Risk  12/04/2017 06/30/2017 05/20/2017 06/27/2016 06/26/2015  Falls in the past year? _0      Depression Screen PHQ 2/9 Scores 12/04/2017 06/30/2017 05/20/2017 06/27/2016  PHQ - 2 Score 0 0 2 0  PHQ- 9 Score - - - -     Cognitive Function MMSE - Mini Mental State Exam 12/04/2017 05/20/2017  Not completed: (No Data) (No Data)        Immunization History  Administered Date(s) Administered  . Influenza Split 07/15/2011, 06/15/2012  . Influenza Whole 07/06/2007, 06/07/2008, 05/16/2009, 05/20/2010  . Influenza, High Dose Seasonal PF 06/26/2015, 06/27/2016, 06/15/2017  . Influenza,inj,Quad PF,6+ Mos 06/20/2013, 06/21/2014  . Pneumococcal Conjugate-13 06/21/2014  . Pneumococcal Polysaccharide-23 06/26/2015  . Td 07/06/2007, 01/12/2017  . Zoster 04/11/2010      Screening Tests Health Maintenance  Topic Date Due  . INFLUENZA VACCINE  03/25/2018  . MAMMOGRAM  06/13/2019  . COLONOSCOPY  07/22/2025  . TETANUS/TDAP  01/13/2027  . DEXA SCAN  Completed  . Hepatitis C Screening  Completed  . PNA vac Low Risk Adult  Completed       Plan:      PCP Notes   Health Maintenance Educated regarding shingrix Mammogram due in Oct this year No other HM due   Abnormal Screens  none  Referrals  none  Patient concerns; Spent time today ventilating regarding her care giving role. Palliative care seen spouse while in the hospital. Educated regarding palliative care and gave her number to call  to assist her to ventilate fears, needs or grief and may open case on spouse if both determine this would be beneficial. States her spouse told her about palliative care, but she was not there and did not know how to respond. States oncologist has written on his record his care is palliative.  [name] will explore the services. She can gauge with them as to the benefit and she feels comfortable at that time discussing with spouse. Overall, was able to state needs and agreed to determine of plan of her own well-being in these stressful days.   Nurse Concerns; As noted;  Cholesterol was not addressed as the patient states she is overeating and understands the difficulty of her situation.  Denies depression but agrees to try to carve out time for herself    Next PCP apt 07/05/18   I have personally reviewed and noted the following in the patient's chart:   . Medical and social history . Use of alcohol, tobacco or illicit drugs  . Current medications and supplements . Functional ability and status . Nutritional status . Physical activity . Advanced directives . List of other physicians . Hospitalizations, surgeries, and ER visits in previous 12 months . Vitals . Screenings to include cognitive, depression, and falls . Referrals and appointments  In addition, I have reviewed and discussed with patient certain preventive protocols, quality metrics, and best practice recommendations. A written personalized care plan for preventive services as well as general preventive health recommendations were provided to patient.     XUXYB,FXOVA, RN  12/04/2017  Above noted reviewed and agree. Shanon Ace, MD

## 2018-04-07 ENCOUNTER — Other Ambulatory Visit: Payer: Self-pay | Admitting: Internal Medicine

## 2018-05-21 ENCOUNTER — Ambulatory Visit: Payer: Medicare Other

## 2018-05-25 ENCOUNTER — Other Ambulatory Visit: Payer: Self-pay | Admitting: Internal Medicine

## 2018-05-25 DIAGNOSIS — Z1231 Encounter for screening mammogram for malignant neoplasm of breast: Secondary | ICD-10-CM

## 2018-06-07 ENCOUNTER — Ambulatory Visit: Payer: Medicare Other

## 2018-06-11 ENCOUNTER — Encounter: Payer: Self-pay | Admitting: Internal Medicine

## 2018-06-11 ENCOUNTER — Ambulatory Visit: Payer: Medicare Other | Admitting: Internal Medicine

## 2018-06-11 VITALS — BP 122/70 | HR 77 | Temp 98.1°F | Wt 165.7 lb

## 2018-06-11 DIAGNOSIS — Z6379 Other stressful life events affecting family and household: Secondary | ICD-10-CM | POA: Diagnosis not present

## 2018-06-11 DIAGNOSIS — Z79899 Other long term (current) drug therapy: Secondary | ICD-10-CM

## 2018-06-11 DIAGNOSIS — M25511 Pain in right shoulder: Secondary | ICD-10-CM | POA: Diagnosis not present

## 2018-06-11 MED ORDER — NAPROXEN 500 MG PO TABS: 500.0000 mg | ORAL_TABLET | Freq: Two times a day (BID) | ORAL | 1 refills | Status: DC

## 2018-06-11 NOTE — Progress Notes (Signed)
Chief Complaint  Patient presents with  . Shoulder Pain    Right shoulder pain. Pt states that she got a flu shot and the shot was given too high in the muscle and since she has been having shoulder pain x 1.5 week (06/01/18) Pt has decreased ROM when trying to lift her arm straight up    HPI: Pamela [name] 67 y.o. come in for continued serious pain in the right upper arm shoulder area after flu shot.  This is been distressing is taking occasional Aleve and local care see above Had  Oct 8  Flu vaccine at pharmacy ( husband is in hospice)  Thinking  given too high in arm  .    And began about 3-4 hours after  Injection and then got worse hard to raise arm .  Tried to do her yoga classes and still hurting and  didn push through.  Awoke 3 am about 8 .   Took 3 aleve.   And and ice pack . Then used moist heat .     The whole situation is quite distressing to her as she is also under stress because her husband who has had now metastatic tongue mouth cancer is now on hospice care and she has needs to take care of him.  She thinks the Celexa is working for her but still much to do ROS: See pertinent positives and negatives per HPI.  Hx of   Right shoulder  Surgery and whas been ok  [place] a while back .   Past Medical History:  Diagnosis Date  . Arthritis KNEES, HANDS, SHOULDERS  . Depression   . Hx of Clostridium difficile infection   . Hyperlipidemia, mixed   . Impingement syndrome of right shoulder   . Intestinal bacterial overgrowth 06/20/2013   Under rx per dr Collene Mares   . Skin cancer    Pamela [name]   . Vaginitis, atrophic    recurrent    Family History  Problem Relation Age of Onset  . Arthritis Sister         CPPD autoinmmune  . Hyperlipidemia Unknown        family hx  . Hypertension Unknown        family hx  . Sudden death Unknown        family hx  . Heart disease Unknown        family hx  . Multiple myeloma Father        deceased  . Stroke Mother 67    Social History    Socioeconomic History  . Marital status: Married    Spouse name: Not on file  . Number of children: Not on file  . Years of education: Not on file  . Highest education level: Not on file  Occupational History  . Occupation: retired    Fish farm manager: RETIRED  Social Needs  . Financial resource strain: Not on file  . Food insecurity:    Worry: Not on file    Inability: Not on file  . Transportation needs:    Medical: Not on file    Non-medical: Not on file  Tobacco Use  . Smoking status: Never Smoker  . Smokeless tobacco: Never Used  Substance and Sexual Activity  . Alcohol use: Yes    Comment: OCCASIONAL  . Drug use: No  . Sexual activity: Not on file  Lifestyle  . Physical activity:    Days per week: Not on file    Minutes per  session: Not on file  . Stress: Not on file  Relationships  . Social connections:    Talks on phone: Not on file    Gets together: Not on file    Attends religious service: Not on file    Active member of club or organization: Not on file    Attends meetings of clubs or organizations: Not on file    Relationship status: Not on file  Other Topics Concern  . 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  Has smoke detector and wears seat belts.  No firearms. No excess sun exposure. Sees dentist regularly .   Dexa nl 2005 and 2008   Husband inhpsicde care for  Throat  Cancer  2019    Outpatient Medications Prior to Visit  Medication Sig Dispense Refill  . conjugated estrogens (PREMARIN) vaginal cream USE VAGINALLY AS DIRECTED 30 g 6  . sertraline (ZOLOFT) 50 MG tablet TAKE ONE TABLET BY MOUTH DAILY 90 tablet 0  . aspirin 500 MG tablet Take 500 mg by mouth as needed for pain. Uses for Arthritis    . Cholecalciferol (D3-1000 PO) Take 1 capsule by mouth daily.    . Probiotic Product (PROBIOTIC PO) Take 1 capsule by mouth daily.     No  facility-administered medications prior to visit.      EXAM:  BP 122/70 (BP Location: Left Arm, Patient Position: Sitting, Cuff Size: Normal)   Pulse 77   Temp 98.1 F (36.7 C) (Oral)   Wt 165 lb 11.2 oz (75.2 kg)   BMI 28.00 kg/m   Body mass index is 28 kg/m.  GENERAL: vitals reviewed and listed above, [name], oriented, appears well hydrated and in no acute distress HEENT: atraumatic, conjunctiva  clear, no obvious abnormalities on inspection of external nose and ears NECK: no obvious masses on inspection palpation  MS: moves all extremities dec rom r shoulder   Elevation anterior   No swelling or redness  Surgical sca r   reotiation is ok  nv seens intact  PSYCH: pleasant and cooperative, nl cognition some stress   BP Readings from Last 3 Encounters:  06/11/18 122/70  12/04/17 120/70  06/30/17 118/62    ASSESSMENT AND PLAN:  Discussed the following assessment and plan:  Acute pain of right shoulder  Medication management  Stress due to illness of family member Possible reaction to the flu vaccine although exam shows no masses concerned that she could have had an injection to the deltoid this was a high dose medication but certainly no local reaction is seen physically.  I do think this will improve over time reassurance trial of regular anti-inflammatory naproxen for a week could try others as needed.  Discussed her stressful situation with her husband in hospice care and supports.  Certainly will stay on the sertraline at this time. Total visit 52mns > 50% spent counseling and coordinating care as indicated in above note and in instructions to patient .     -Patient advised to return or notify health care team  if  new concerns arise.  Patient Instructions  It  Think this   Is related to the injection as you discussed  At this time .  And antiinflammatory  For 7- 10 days .   And time   [place]  know if    persistent or progressive but no signs of infection or or  other derangement.      Standley Brooking. Jamari Diana M.D.

## 2018-06-11 NOTE — Patient Instructions (Addendum)
It  Think this   Is related to the injection as you discussed  At this time .  And antiinflammatory  For 7- 10 days .   And time   Let us know if    persistent or progressive but no signs of infection or or other derangement.

## 2018-06-23 DIAGNOSIS — M25511 Pain in right shoulder: Secondary | ICD-10-CM

## 2018-06-24 NOTE — Telephone Encounter (Signed)
Patient is calling in on the status of her my chart request

## 2018-06-25 ENCOUNTER — Ambulatory Visit
Admission: RE | Admit: 2018-06-25 | Discharge: 2018-06-25 | Disposition: A | Discharge status: Home / Self Care | Payer: Medicare Other | Source: Ambulatory Visit | Attending: Internal Medicine | Admitting: Internal Medicine

## 2018-06-25 DIAGNOSIS — Z1231 Encounter for screening mammogram for malignant neoplasm of breast: Secondary | ICD-10-CM

## 2018-06-25 MED ORDER — MELOXICAM 15 MG PO TABS: 15.0000 mg | ORAL_TABLET | Freq: Every day | ORAL | 0 refills | Status: DC

## 2018-06-25 NOTE — Telephone Encounter (Signed)
Called and spoke with patient.  Wants to take the Meloxicam, does not like how the Pred affects you.  Pt requests referral to Dr Tamala Julian - referral placed.   Nothing further needed.

## 2018-06-25 NOTE — Telephone Encounter (Signed)
Please advise Dr Panosh, thanks.   

## 2018-06-25 NOTE — Telephone Encounter (Signed)
I would like her to see dr Tamala Julian or sports medicine  Please do referral   We can try a different  Antiinflammatory   Or prednisone in the interim  Stop the narpoxyn   Can send in meloxicam   15 mg 1 po qd disp 20  OR    Prednisone  20 mg  Twice a day for  5 days  Disp 10   Her choice

## 2018-06-29 ENCOUNTER — Other Ambulatory Visit: Payer: Self-pay | Admitting: Internal Medicine

## 2018-06-29 DIAGNOSIS — R928 Other abnormal and inconclusive findings on diagnostic imaging of breast: Secondary | ICD-10-CM

## 2018-06-29 NOTE — Progress Notes (Signed)
[name] [place], [place] [address] Phone: [phone] Subjective:   Pamela [name], am serving as a scribe for Dr. Hulan Saas.  I'm seeing this patient by the request  of:  Panosh, Standley Brooking, MD   CC: right shoulder pain  ELF:[id number]  Pamela [name] is a 67 y.o. female coming in with complaint of right shoulder pain. She has had pain for 1 month since getting the flu shot. Patient said that she had a hard time raising her arm that night. Pain over the anterior joint. Describes an ache. Was given naproxen by another provider. Is now using meloxicam. Patient has had surgery on right shoulder for bone spur removal. Used to do yoga and weight training and had some achiness with some movements such as flow yoga.       Past Medical History:  Diagnosis Date  . Arthritis KNEES, HANDS, SHOULDERS  . Depression   . Hx of Clostridium difficile infection   . Hyperlipidemia, mixed   . Impingement syndrome of right shoulder   . Intestinal bacterial overgrowth 06/20/2013   Under rx per dr Collene Mares   . Skin cancer    Tonia Brooms   . Vaginitis, atrophic    recurrent   Past Surgical History:  Procedure Laterality Date  . ABDOMINAL HYSTERECTOMY  1992   W/ BILATERAL SALPINGO-OOPHORECTOMY  . CARPAL TUNNEL RELEASE    . SHOULDER ARTHROSCOPY WITH ROTATOR CUFF REPAIR  08/06/2012   Procedure: SHOULDER ARTHROSCOPY WITH ROTATOR CUFF REPAIR;  Surgeon: Magnus Sinning, MD;  Location: [facility];  Service: Orthopedics;  Laterality: Right;  . SHOULDER ARTHROSCOPY WITH SUBACROMIAL DECOMPRESSION  08/06/2012   Procedure: SHOULDER ARTHROSCOPY WITH SUBACROMIAL DECOMPRESSION;  Surgeon: Magnus Sinning, MD;  Location: [facility];  Service: Orthopedics;  Laterality: Right;  debridement of labral  . TONSILLECTOMY  AS CHILD   Social History   Socioeconomic History  . Marital status: Married    Spouse name: Not on file  . Number  of children: Not on file  . Years of education: Not on file  . Highest education level: Not on file  Occupational History  . Occupation: retired    Fish farm manager: RETIRED  Social Needs  . Financial resource strain: Not on file  . Food insecurity:    Worry: Not on file    Inability: Not on file  . Transportation needs:    Medical: Not on file    Non-medical: Not on file  Tobacco Use  . Smoking status: Never Smoker  . Smokeless tobacco: Never Used  Substance and Sexual Activity  . Alcohol use: Yes    Comment: OCCASIONAL  . Drug use: [name]  . Sexual activity: Not on file  Lifestyle  . Physical activity:    Days per week: Not on file    Minutes per session: Not on file  . Stress: Not on file  Relationships  . Social connections:    Talks on phone: Not on file    Gets together: Not on file    Attends religious service: Not on file    Active member of club or organization: Not on file    Attends meetings of clubs or organizations: Not on file    Relationship status: Not on file  Other Topics Concern  . 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  Has smoke detector and wears seat belts.  [name] firearms. [name] excess sun exposure. Sees dentist regularly .   Dexa nl 2005 and 2008   Husband inhpsicde care for  Throat  Cancer  2019   Allergies  Allergen Reactions  . Simvastatin Other (See Comments)    JOINT ACHES  . Atorvastatin     Muscle aches   Family History  Problem Relation Age of Onset  . Arthritis Sister         CPPD autoinmmune  . Hyperlipidemia Unknown        family hx  . Hypertension Unknown        family hx  . Sudden death Unknown        family hx  . Heart disease Unknown        family hx  . Multiple myeloma Father        deceased  . Stroke Mother 28       Current Outpatient Medications (Analgesics):  .  meloxicam (MOBIC) 15 MG tablet, Take 1  tablet (15 mg total) by mouth daily. .  naproxen (NAPROSYN) 500 MG tablet, Take 1 tablet (500 mg total) by mouth 2 (two) times daily with a meal. As directed   Current Outpatient Medications (Other):  .  conjugated estrogens (PREMARIN) vaginal cream, USE VAGINALLY AS DIRECTED .  sertraline (ZOLOFT) 50 MG tablet, TAKE ONE TABLET BY MOUTH DAILY    Past medical history, social, surgical and family history all reviewed in electronic medical record.  [name] pertanent information unless stated regarding to the chief complaint.   Review of Systems:  [name] headache, visual changes, nausea, vomiting, diarrhea, constipation, dizziness, abdominal pain, skin rash, fevers, chills, night sweats, weight loss, swollen lymph nodes, body aches, joint swelling,  chest pain, shortness of breath, mood changes.  Positive muscle aches  Objective  Blood pressure 128/84, pulse 62, height 5' 4.5" (1.638 m), weight 167 lb (75.8 kg), SpO2 98 %.    General: [name] apparent distress alert and oriented x3 mood and affect normal, dressed appropriately.  HEENT: Pupils equal, extraocular movements intact  Respiratory: Patient's speak in full sentences and does not appear short of breath  Cardiovascular: [name] lower extremity edema, non tender, [name] erythema  Skin: Warm dry intact with [name] signs of infection or rash on extremities or on axial skeleton.  Abdomen: Soft nontender  Neuro: Cranial nerves II through XII are intact, neurovascularly intact in all extremities with 2+ DTRs and 2+ pulses.  Lymph: [name] lymphadenopathy of posterior or anterior cervical chain or axillae bilaterally.  Gait normal with good balance and coordination.  MSK:  Non tender with full range of motion and good stability and symmetric strength and tone of  elbows, wrist, hip, knee and ankles bilaterally.  Right shoulder does show some fullness of the deltoid compared to the contralateral side.  Minorly tender to palpation to even light palpation.  Patient does have  positive impingement.  Mild crepitus with range of motion.  Lacks last 5 degrees of forward flexion of the shoulder as well as the last 10 degrees of external rotation range of motion.  Positive impingement.  Rotator cuff strength 4 out of 5 compared to the contralateral side.  Neurovascularly intact distally.  MSK US performed of: Right shoulder pain This study was ordered, performed, and interpreted by Charlann Boxer D.O.  Shoulder:   Supraspinatus: Degenerative tear noted that is high-grade, greater  than 50% torr noted with some retraction.  Bursal bulge seen with shoulder abduction on impingement view. AC joint: Moderate arthritic changes Glenohumeral Joint: Moderate arthritic changes Glenoid Labrum:  Intact without visualized tears. Biceps Tendon: Trace fluid noted Deltoid muscle this seems to have some scarring noted that could be secondary to the injection.  [name] active infection or any signs of increased vascularity that would correspond with a inclusion body myositis Impression: Rotator cuff arthropathy  Procedure: Real-time Ultrasound Guided Injection of right glenohumeral joint Device: GE Logiq Q7  Ultrasound guided injection is preferred based studies that show increased duration, increased effect, greater accuracy, decreased procedural pain, increased response rate with ultrasound guided versus blind injection.  Verbal informed consent obtained.  Time-out conducted.  Noted [name] overlying erythema, induration, or other signs of local infection.  Skin prepped in a sterile fashion.  Local anesthesia: Topical Ethyl chloride.  With sterile technique and under real time ultrasound guidance:  Joint visualized.  23g 1  inch needle inserted posterior approach. Pictures taken for needle placement. Patient did have injection of 2 cc of 1% lidocaine, 2 cc of 0.5% Marcaine, and 1.0 cc of Kenalog 40 mg/dL. Completed without difficulty  Pain immediately resolved suggesting accurate placement of the  medication.  Advised to call if fevers/chills, erythema, induration, drainage, or persistent bleeding.  Images permanently stored and available for review in the ultrasound unit.  Impression: Technically successful ultrasound guided injection.  [id number]; 15 additional minutes spent for Therapeutic exercises as stated in above notes.  This included exercises focusing on stretching, strengthening, with significant focus on eccentric aspects.   Long term goals include an improvement in range of motion, strength, endurance as well as avoiding reinjury. Patient's frequency would include in 1-2 times a day, 3-5 times a week for a duration of 6-12 weeks. Shoulder Exercises that included:  Basic scapular stabilization to include adduction and depression of scapula Scaption, focusing on proper movement and good control Internal and External rotation utilizing a theraband, with elbow tucked at side entire time Rows with theraband   Proper technique shown and discussed handout in great detail with ATC.  All questions were discussed and answered.     Impression and Recommendations:     This case required medical decision making of moderate complexity. The above documentation has been reviewed and is accurate and complete Lyndal Pulley, DO       Note: This dictation was prepared with Dragon dictation along with smaller phrase technology. Any transcriptional errors that result from this process are unintentional.

## 2018-07-01 ENCOUNTER — Encounter: Payer: Self-pay | Admitting: Family Medicine

## 2018-07-01 ENCOUNTER — Ambulatory Visit: Payer: Self-pay

## 2018-07-01 ENCOUNTER — Ambulatory Visit: Payer: Medicare Other | Admitting: Family Medicine

## 2018-07-01 VITALS — BP 128/84 | HR 62 | Ht 64.5 in | Wt 167.0 lb

## 2018-07-01 DIAGNOSIS — M12811 Other specific arthropathies, not elsewhere classified, right shoulder: Secondary | ICD-10-CM | POA: Diagnosis not present

## 2018-07-01 DIAGNOSIS — M25511 Pain in right shoulder: Secondary | ICD-10-CM

## 2018-07-01 NOTE — Patient Instructions (Signed)
Good see you  Ice 20 minutes 2 times daily. Usually after activity and before bed. pennsaid pinkie amount topically 2 times daily as needed.  Exercises 3 times a week.  Over the counter try Vitamin D 2000 IU daily  See me again in 6 weeks

## 2018-07-01 NOTE — Assessment & Plan Note (Signed)
I believe the patient has had rotator cuff arthropathy for quite some time.  History of surgical intervention greater than a decade ago.  Patient though likely had an exacerbation after the flu shot that did cause a reactive deltoid irritation that caused patient to use her shoulder differently and caused an exacerbation of an underlying problem.  Patient given injection today due to her having difficulty with her husband's health and will be the main caregiver.  We discussed icing regimen, given trial of topical anti-inflammatories, home exercises and work with Product/process development scientist.  Follow-up again in 4 to 6 weeks

## 2018-07-05 ENCOUNTER — Ambulatory Visit: Admission: RE | Admit: 2018-07-05 | Payer: Medicare Other | Source: Ambulatory Visit

## 2018-07-05 ENCOUNTER — Encounter: Payer: Self-pay | Admitting: Internal Medicine

## 2018-07-05 ENCOUNTER — Ambulatory Visit (INDEPENDENT_AMBULATORY_CARE_PROVIDER_SITE_OTHER): Payer: Medicare Other | Admitting: Internal Medicine

## 2018-07-05 ENCOUNTER — Ambulatory Visit
Admission: RE | Admit: 2018-07-05 | Discharge: 2018-07-05 | Disposition: A | Discharge status: Home / Self Care | Payer: Medicare Other | Source: Ambulatory Visit | Attending: Internal Medicine | Admitting: Internal Medicine

## 2018-07-05 VITALS — BP 124/78 | HR 66 | Temp 98.0°F | Ht 64.25 in | Wt 162.4 lb

## 2018-07-05 DIAGNOSIS — Z79899 Other long term (current) drug therapy: Secondary | ICD-10-CM

## 2018-07-05 DIAGNOSIS — N952 Postmenopausal atrophic vaginitis: Secondary | ICD-10-CM

## 2018-07-05 DIAGNOSIS — Z0000 Encounter for general adult medical examination without abnormal findings: Secondary | ICD-10-CM | POA: Diagnosis not present

## 2018-07-05 DIAGNOSIS — E785 Hyperlipidemia, unspecified: Secondary | ICD-10-CM | POA: Diagnosis not present

## 2018-07-05 DIAGNOSIS — M12811 Other specific arthropathies, not elsewhere classified, right shoulder: Secondary | ICD-10-CM

## 2018-07-05 DIAGNOSIS — F4329 Adjustment disorder with other symptoms: Secondary | ICD-10-CM | POA: Diagnosis not present

## 2018-07-05 DIAGNOSIS — R928 Other abnormal and inconclusive findings on diagnostic imaging of breast: Secondary | ICD-10-CM

## 2018-07-05 LAB — CBC WITH DIFFERENTIAL/PLATELET
BASOS ABS: 0 10*3/uL (ref 0.0–0.1)
Basophils Relative: 0.4 % (ref 0.0–3.0)
EOS ABS: 0.1 10*3/uL (ref 0.0–0.7)
Eosinophils Relative: 0.6 % (ref 0.0–5.0)
HCT: 39.1 % (ref 36.0–46.0)
Hemoglobin: 13.2 g/dL (ref 12.0–15.0)
LYMPHS ABS: 2.4 10*3/uL (ref 0.7–4.0)
LYMPHS PCT: 30.2 % (ref 12.0–46.0)
MCHC: 33.6 g/dL (ref 30.0–36.0)
MCV: 81.4 fl (ref 78.0–100.0)
Monocytes Absolute: 0.7 10*3/uL (ref 0.1–1.0)
Monocytes Relative: 8.9 % (ref 3.0–12.0)
NEUTROS ABS: 4.9 10*3/uL (ref 1.4–7.7)
Neutrophils Relative %: 59.9 % (ref 43.0–77.0)
PLATELETS: 262 10*3/uL (ref 150.0–400.0)
RBC: 4.81 Mil/uL (ref 3.87–5.11)
RDW: 13.9 % (ref 11.5–15.5)
WBC: 8.1 10*3/uL (ref 4.0–10.5)

## 2018-07-05 LAB — TSH: TSH: 2.33 u[IU]/mL (ref 0.35–4.50)

## 2018-07-05 LAB — BASIC METABOLIC PANEL
BUN: 25 mg/dL — ABNORMAL HIGH (ref 6–23)
CALCIUM: 9.2 mg/dL (ref 8.4–10.5)
CO2: 28 meq/L (ref 19–32)
CREATININE: 0.92 mg/dL (ref 0.40–1.20)
Chloride: 101 mEq/L (ref 96–112)
GFR: 64.3 mL/min (ref >60.00)
GLUCOSE: 89 mg/dL (ref 70–99)
Potassium: 4.4 mEq/L (ref 3.5–5.1)
Sodium: 137 mEq/L (ref 135–145)

## 2018-07-05 LAB — LIPID PANEL
CHOL/HDL RATIO: 4
Cholesterol: 270 mg/dL — ABNORMAL HIGH (ref 0–200)
HDL: 70.1 mg/dL (ref >39.00)
LDL CALC: 173 mg/dL — AB (ref 0–99)
NONHDL: 199.46
Triglycerides: 133 mg/dL (ref 0.0–149.0)
VLDL: 26.6 mg/dL (ref 0.0–40.0)

## 2018-07-05 LAB — HEPATIC FUNCTION PANEL
ALK PHOS: 63 U/L (ref 39–117)
ALT: 26 U/L (ref 0–35)
AST: 23 U/L (ref 0–37)
Albumin: 4.7 g/dL (ref 3.5–5.2)
BILIRUBIN DIRECT: 0.1 mg/dL (ref 0.0–0.3)
Total Bilirubin: 0.6 mg/dL (ref 0.2–1.2)
Total Protein: 6.9 g/dL (ref 6.0–8.3)

## 2018-07-05 MED ORDER — ESTROGENS, CONJUGATED 0.625 MG/GM VA CREA: TOPICAL_CREAM | VAGINAL | 6 refills | Status: DC

## 2018-07-05 NOTE — Progress Notes (Signed)
Chief Complaint  Patient presents with  . Annual Exam  . Medication Management    HPI: Pamela [name] 67 y.o. comes in today for Preventive Medicare exam/ wellness visit .Since last visit.  Shoulder better dx rc injury  Tear and  Better with steroid injection .   Husband adapting some to hospice  but stress eating  NO new health issues but is on recall for mammography defect.  Doing ok on 50 sertraline  May need to increase   Health Maintenance  Topic Date Due  . MAMMOGRAM  06/25/2020  . COLONOSCOPY  07/22/2025  . TETANUS/TDAP  01/13/2027  . INFLUENZA VACCINE  Completed  . DEXA SCAN  Completed  . Hepatitis C Screening  Completed  . PNA vac Low Risk Adult  Completed   Health Maintenance Review LIFESTYLE:  Exercise:    Not much waking dog  Tobacco/ETS:no Alcohol:  One a day  Sugar beverages: no rare  Sleep:  6  Drug use: no HH: 2 with dog    Hearing: ok  Vision:  No limitations at present . Last eye check UTD  Safety:  Has smoke detector and wears seat belts. . No excess sun exposure. Sees dentist regularly.  Falls: no  Memory: Felt to be good  , no concern from her or her family.  Depression: No anhedonia unusual crying or depressive symptoms  Nutrition: Eats well balanced diet; adequate calcium and vitamin D. No swallowing chewing problems.  Injury: no major injuries in the last six months.  Other healthcare providers:  Reviewed today .  Preventive parameters: up-to-date  Reviewed   ADLS:   There are no problems or need for assistance  driving, feeding, obtaining food, dressing, toileting and bathing, managing money using phone. She is independent  ROS:  GEN/ HEENT: No fever, significant weight changes sweats headaches vision problems hearing changes, CV/ PULM; No chest pain shortness of breath cough, syncope,edema  change in exercise tolerance. GI /GU: No adominal pain, vomiting, change in bowel habits. No blood in the stool. No significant GU  symptoms. SKIN/HEME: ,no acute skin rashes suspicious lesions or bleeding. No lymphadenopathy, nodules, masses.  NEURO/ PSYCH:  No neurologic signs such as weakness numbness. No depression anxiety. IMM/ Allergy: No unusual infections.  Allergy .   REST of 12 system review negative except as per HPI   Past Medical History:  Diagnosis Date  . Arthritis KNEES, HANDS, SHOULDERS  . Depression   . Hx of Clostridium difficile infection   . Hyperlipidemia, mixed   . Impingement syndrome of right shoulder   . Intestinal bacterial overgrowth 06/20/2013   Under rx per dr Collene Mares   . Skin cancer    Tonia Brooms   . Vaginitis, atrophic    recurrent    Family History  Problem Relation Age of Onset  . Arthritis Sister         CPPD autoinmmune  . Hyperlipidemia Unknown        family hx  . Hypertension Unknown        family hx  . Sudden death Unknown        family hx  . Heart disease Unknown        family hx  . Multiple myeloma Father        deceased  . Stroke Mother 42    Social History   Socioeconomic History  . Marital status: Married    Spouse name: Not on file  . Number of children: Not on file  .  Years of education: Not on file  . Highest education level: Not on file  Occupational History  . Occupation: retired    Fish farm manager: RETIRED  Social Needs  . Financial resource strain: Not on file  . Food insecurity:    Worry: Not on file    Inability: Not on file  . Transportation needs:    Medical: Not on file    Non-medical: Not on file  Tobacco Use  . Smoking status: Never Smoker  . Smokeless tobacco: Never Used  Substance and Sexual Activity  . Alcohol use: Yes    Comment: OCCASIONAL  . Drug use: No  . Sexual activity: Not on file  Lifestyle  . Physical activity:    Days per week: Not on file    Minutes per session: Not on file  . Stress: Not on file  Relationships  . Social connections:    Talks on phone: Not on file    Gets together: Not on file    Attends religious  service: Not on file    Active member of club or organization: Not on file    Attends meetings of clubs or organizations: Not on file    Relationship status: Not on file  Other Topics Concern  . 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  Has smoke detector and wears seat belts.  No firearms. No excess sun exposure. Sees dentist regularly .   Dexa nl 2005 and 2008   Husband inhpsicde care for  Throat  Cancer  2019    Outpatient Encounter Medications as of 07/05/2018  Medication Sig  . cholecalciferol (VITAMIN D3) 25 MCG (1000 UT) tablet Take 1,000 Units by mouth daily.  Marland Kitchen conjugated estrogens (PREMARIN) vaginal cream USE VAGINALLY AS DIRECTED  . sertraline (ZOLOFT) 50 MG tablet TAKE ONE TABLET BY MOUTH DAILY  . [DISCONTINUED] conjugated estrogens (PREMARIN) vaginal cream USE VAGINALLY AS DIRECTED  . [DISCONTINUED] meloxicam (MOBIC) 15 MG tablet Take 1 tablet (15 mg total) by mouth daily.  . [DISCONTINUED] naproxen (NAPROSYN) 500 MG tablet Take 1 tablet (500 mg total) by mouth 2 (two) times daily with a meal. As directed   No facility-administered encounter medications on file as of 07/05/2018.     EXAM:  BP 124/78 (BP Location: Left Arm, Patient Position: Sitting, Cuff Size: Normal)   Pulse 66   Temp 98 F (36.7 C) (Oral)   Ht 5' 4.25" (1.632 m)   Wt 162 lb 6.4 oz (73.7 kg)   SpO2 97%   BMI 27.66 kg/m   Body mass index is 27.66 kg/m.  Physical Exam: Vital signs reviewed TML:YYTK is a well-developed well-nourished alert cooperative   who appears stated age in no acute distress.  HEENT: normocephalic atraumatic , Eyes: PERRL EOM's full, conjunctiva clear, Nares: paten,t no deformity discharge or tenderness., Ears: no deformity EAC's clear TMs with normal landmarks. Mouth: clear OP, no lesions, edema.  Moist mucous membranes. Dentition in adequate repair. NECK:  supple without masses, thyromegaly or bruits. CHEST/PULM:  Clear to auscultation and percussion breath sounds equal no wheeze , rales or rhonchi. No chest wall deformities or tenderness.Breast: normal by inspection . No dimpling, discharge, masses, tenderness or discharge . CV: PMI is nondisplaced, S1 S2 no gallops, murmurs, rubs. Peripheral pulses are full without delay.No JVD .  ABDOMEN: Bowel sounds normal nontender  No guard or rebound, no hepato splenomegal no CVA tenderness.   Extremtities:  No clubbing cyanosis or edema, no acute joint swelling or redness no focal atrophy NEURO:  Oriented x3, cranial nerves 3-12 appear to be intact, no obvious focal weakness,gait within normal limits no abnormal reflexes or asymmetrical SKIN: No acute rashes normal turgor, color, no bruising or petechiae. PSYCH: Oriented, good eye contact, no obvious depression anxiety, cognition and judgment appear normal. LN: no cervical axillary inguinal adenopathy No noted deficits in memory, attention, and speech.   Lab Results  Component Value Date   WBC 4.7 06/20/2016   HGB 12.3 06/20/2016   HCT 36.6 06/20/2016   PLT 231.0 06/20/2016   GLUCOSE 99 06/30/2017   CHOL 269 (H) 06/30/2017   TRIG 112.0 06/30/2017   HDL 70.10 06/30/2017   LDLDIRECT 173.5 06/13/2013   LDLCALC 176 (H) 06/30/2017   ALT 15 06/30/2017   AST 17 06/30/2017   NA 137 06/30/2017   K 4.3 06/30/2017   CL 103 06/30/2017   CREATININE 0.80 06/30/2017   BUN 19 06/30/2017   CO2 29 06/30/2017   TSH 1.82 06/20/2016   Wt Readings from Last 3 Encounters:  07/05/18 162 lb 6.4 oz (73.7 kg)  07/01/18 167 lb (75.8 kg)  06/11/18 165 lb 11.2 oz (75.2 kg)     ASSESSMENT AND PLAN:  Discussed the following assessment and plan:  Visit for preventive health examination  Medication management - Plan: Basic metabolic panel, CBC with Differential/Platelet, Hepatic function panel, Lipid panel, TSH  Adjustment disorder with other symptom - Plan:  Basic metabolic panel, CBC with Differential/Platelet, Hepatic function panel, Lipid panel, TSH  Hyperlipidemia, unspecified hyperlipidemia type - Plan: Basic metabolic panel, CBC with Differential/Platelet, Hepatic function panel, Lipid panel, TSH  Atrophic vaginitis - Plan: Basic metabolic panel, CBC with Differential/Platelet, Hepatic function panel, Lipid panel, TSH  Rotator cuff arthropathy of right shoulder - Plan: Basic metabolic panel, CBC with Differential/Platelet, Hepatic function panel, Lipid panel, TSH Lab monitoring    Fu depending  Can inc dose sertraline  if needed and then fu  Patient Care Team: Burnis Medin, MD as PCP - General Paralee Cancel, MD (Orthopedic Surgery) Crista Luria, MD (Dermatology) Juanita Craver, MD as Attending Physician (Gastroenterology) Paralee Cancel, MD as Consulting Physician (Orthopedic Surgery)  Patient Instructions  Will notify you  of labs when available.   I fneeded can increase sertraline to 100 mg per day   Contact us about this and then fu.  ROV in 4-6 months or as needed    Preventive Care 65 Years and Older, Female Preventive care refers to lifestyle choices and visits with your health care provider that can promote health and wellness. What does preventive care include?  A yearly physical exam. This is also called an annual well check.  Dental exams once or twice a year.  Routine eye exams. Ask your health care provider how often you should have your eyes checked.  Personal lifestyle choices, including: ? Daily care of your teeth and gums. ? Regular physical activity. ? Eating a healthy diet. ? Avoiding tobacco and drug use. ? Limiting alcohol use. ? Practicing safe sex. ? Taking low-dose aspirin every day. ? Taking vitamin and mineral supplements as recommended by your health care provider. What happens during an annual well check? The services and screenings done by your health care provider during your annual well  check will depend on your age, overall health, lifestyle risk  factors, and family history of disease. Counseling Your health care provider may ask you questions about your:  Alcohol use.  Tobacco use.  Drug use.  Emotional well-being.  Home and relationship well-being.  Sexual activity.  Eating habits.  History of falls.  Memory and ability to understand (cognition).  Work and work Statistician.  Reproductive health.  Screening You may have the following tests or measurements:  Height, weight, and BMI.  Blood pressure.  Lipid and cholesterol levels. These may be checked every 5 years, or more frequently if you are over 36 years old.  Skin check.  Lung cancer screening. You may have this screening every year starting at age 32 if you have a 30-pack-year history of smoking and currently smoke or have quit within the past 15 years.  Fecal occult blood test (FOBT) of the stool. You may have this test every year starting at age 47.  Flexible sigmoidoscopy or colonoscopy. You may have a sigmoidoscopy every 5 years or a colonoscopy every 10 years starting at age 69.  Hepatitis C blood test.  Hepatitis B blood test.  Sexually transmitted disease (STD) testing.  Diabetes screening. This is done by checking your blood sugar (glucose) after you have not eaten for a while (fasting). You may have this done every 1-3 years.  Bone density scan. This is done to screen for osteoporosis. You may have this done starting at age 69.  Mammogram. This may be done every 1-2 years. Talk to your health care provider about how often you should have regular mammograms.  Talk with your health care provider about your test results, treatment options, and if necessary, the need for more tests. Vaccines Your health care provider may recommend certain vaccines, such as:  Influenza vaccine. This is recommended every year.  Tetanus, diphtheria, and acellular pertussis (Tdap, Td) vaccine. You  may need a Td booster every 10 years.  Varicella vaccine. You may need this if you have not been vaccinated.  Zoster vaccine. You may need this after age 19.  Measles, mumps, and rubella (MMR) vaccine. You may need at least one dose of MMR if you were born in 1957 or later. You may also need a second dose.  Pneumococcal 13-valent conjugate (PCV13) vaccine. One dose is recommended after age 20.  Pneumococcal polysaccharide (PPSV23) vaccine. One dose is recommended after age 35.  Meningococcal vaccine. You may need this if you have certain conditions.  Hepatitis A vaccine. You may need this if you have certain conditions or if you travel or work in places where you may be exposed to hepatitis A.  Hepatitis B vaccine. You may need this if you have certain conditions or if you travel or work in places where you may be exposed to hepatitis B.  Haemophilus influenzae type b (Hib) vaccine. You may need this if you have certain conditions.  Talk to your health care provider about which screenings and vaccines you need and how often you need them. This information is not intended to replace advice given to you by your health care provider. Make sure you discuss any questions you have with your health care provider. Document Released: 09/07/2015 Document Revised: 04/30/2016 Document Reviewed: 06/12/2015 Elsevier Interactive Patient Education  2018 [place].  M.D.

## 2018-07-05 NOTE — Patient Instructions (Signed)
Will notify you  of labs when available.   I fneeded can increase sertraline to 100 mg per day   Contact us about this and then fu.  ROV in 4-6 months or as needed    Preventive Care 65 Years and Older, Female Preventive care refers to lifestyle choices and visits with your health care provider that can promote health and wellness. What does preventive care include?  A yearly physical exam. This is also called an annual well check.  Dental exams once or twice a year.  Routine eye exams. Ask your health care provider how often you should have your eyes checked.  Personal lifestyle choices, including: ? Daily care of your teeth and gums. ? Regular physical activity. ? Eating a healthy diet. ? Avoiding tobacco and drug use. ? Limiting alcohol use. ? Practicing safe sex. ? Taking low-dose aspirin every day. ? Taking vitamin and mineral supplements as recommended by your health care provider. What happens during an annual well check? The services and screenings done by your health care provider during your annual well check will depend on your age, overall health, lifestyle risk factors, and family history of disease. Counseling Your health care provider may ask you questions about your:  Alcohol use.  Tobacco use.  Drug use.  Emotional well-being.  Home and relationship well-being.  Sexual activity.  Eating habits.  History of falls.  Memory and ability to understand (cognition).  Work and work Statistician.  Reproductive health.  Screening You may have the following tests or measurements:  Height, weight, and BMI.  Blood pressure.  Lipid and cholesterol levels. These may be checked every 5 years, or more frequently if you are over 24 years old.  Skin check.  Lung cancer screening. You may have this screening every year starting at age 68 if you have a 30-pack-year history of smoking and currently smoke or have quit within the past 15 years.  Fecal occult  blood test (FOBT) of the stool. You may have this test every year starting at age 61.  Flexible sigmoidoscopy or colonoscopy. You may have a sigmoidoscopy every 5 years or a colonoscopy every 10 years starting at age 87.  Hepatitis C blood test.  Hepatitis B blood test.  Sexually transmitted disease (STD) testing.  Diabetes screening. This is done by checking your blood sugar (glucose) after you have not eaten for a while (fasting). You may have this done every 1-3 years.  Bone density scan. This is done to screen for osteoporosis. You may have this done starting at age 23.  Mammogram. This may be done every 1-2 years. Talk to your health care provider about how often you should have regular mammograms.  Talk with your health care provider about your test results, treatment options, and if necessary, the need for more tests. Vaccines Your health care provider may recommend certain vaccines, such as:  Influenza vaccine. This is recommended every year.  Tetanus, diphtheria, and acellular pertussis (Tdap, Td) vaccine. You may need a Td booster every 10 years.  Varicella vaccine. You may need this if you have not been vaccinated.  Zoster vaccine. You may need this after age 24.  Measles, mumps, and rubella (MMR) vaccine. You may need at least one dose of MMR if you were born in 1957 or later. You may also need a second dose.  Pneumococcal 13-valent conjugate (PCV13) vaccine. One dose is recommended after age 4.  Pneumococcal polysaccharide (PPSV23) vaccine. One dose is recommended after age 48.  Meningococcal vaccine. You may need this if you have certain conditions.  Hepatitis A vaccine. You may need this if you have certain conditions or if you travel or work in places where you may be exposed to hepatitis A.  Hepatitis B vaccine. You may need this if you have certain conditions or if you travel or work in places where you may be exposed to hepatitis B.  Haemophilus influenzae  type b (Hib) vaccine. You may need this if you have certain conditions.  Talk to your health care provider about which screenings and vaccines you need and how often you need them. This information is not intended to replace advice given to you by your health care provider. Make sure you discuss any questions you have with your health care provider. Document Released: 09/07/2015 Document Revised: 04/30/2016 Document Reviewed: 06/12/2015 Elsevier Interactive Patient Education  [employer].

## 2018-07-07 ENCOUNTER — Other Ambulatory Visit: Payer: Self-pay | Admitting: Internal Medicine

## 2018-07-09 NOTE — Telephone Encounter (Signed)
Please advise Dr Panosh, thanks.   

## 2018-07-12 NOTE — Telephone Encounter (Signed)
Please send in sertraline 100 mg per day disp 90  Refill x 1   Plan fu  In a month or so as  Possible

## 2018-07-14 MED ORDER — SERTRALINE HCL 100 MG PO TABS: 100.0000 mg | ORAL_TABLET | Freq: Every day | ORAL | 1 refills | Status: DC

## 2018-07-14 NOTE — Telephone Encounter (Signed)
Rx sent Nothing further needed.  

## 2018-07-14 NOTE — Telephone Encounter (Signed)
This has been taken care of Multiple message threads Nothing further needed.

## 2018-08-12 ENCOUNTER — Ambulatory Visit: Payer: Medicare Other | Admitting: Family Medicine

## 2018-11-08 ENCOUNTER — Telehealth: Payer: Self-pay | Admitting: Internal Medicine

## 2018-11-08 ENCOUNTER — Ambulatory Visit: Payer: Self-pay | Admitting: Hematology

## 2018-11-08 NOTE — Telephone Encounter (Signed)
Copied from [place] [phone]. Topic: Quick Communication - See Telephone Encounter >> Nov 08, 2018  4:31 PM Blase Mess A wrote: CRM for notification. See Telephone encounter for: 11/08/18.  Kayla calling from [facility] is inquiring regarding conjugated estrogens (PREMARIN) vaginal cream [[id number]] -looking for specific sig. How often to be used? Please advise [phone]

## 2018-11-08 NOTE — Telephone Encounter (Signed)
Copied from [place] [id number]. Topic: Quick Communication - See Telephone Encounter >> Nov 08, 2018 10:51 AM Ahmed Prima L wrote: CRM for notification. See Telephone encounter for: 11/08/18.  Pharmacy called to get clarification on directions for conjugated estrogens (PREMARIN) vaginal cream.

## 2018-11-08 NOTE — Telephone Encounter (Signed)
Pharmacy called to get clarification on directions for conjugated estrogens (PREMARIN) vaginal cream.  Call History    Type Contact Phone User  11/08/2018 10:51 AM Phone (Incoming) [phone] [place], [place] (Pharmacy) [phone] Vernona Rieger     Reason for Disposition . Caller has NON-URGENT medication question about med that PCP prescribed and triager unable to answer question  Protocols used: MEDICATION QUESTION CALL-A-AH

## 2018-11-10 NOTE — Telephone Encounter (Signed)
Dr.Fry please advise on how often to be used in absence of Dr.Panosh

## 2018-11-11 NOTE — Telephone Encounter (Signed)
Use one applicator full daily

## 2018-11-12 NOTE — Telephone Encounter (Signed)
Pharmacy has been called and provided clarification

## 2018-11-18 ENCOUNTER — Telehealth: Payer: Self-pay

## 2018-11-18 NOTE — Telephone Encounter (Signed)
Author phoned pt. to offer virtual awv with PCP. Pt. Stated she would be open to doing that using her smartphone. Pt. needs e-mail invite, routed to [place] per administrator's request to set up.

## 2018-12-06 NOTE — Progress Notes (Signed)
Virtual Visit via Video Note  I connected with@ on 12/07/18 at  8:30 AM EDT by a video enabled telemedicine application and verified that I am speaking with the correct person using two identifiers. Location patient: home Location provider:work office Persons participating in the virtual visit: patient, provider  [place] national recommendations  regarding COVID 19 pandemic   video visit is advised over in office visit for this patient.  Discussed the limitations of evaluation and management by telemedicine and  availability of in person appointments. The patient expressed understanding and agreed to proceed.   HPI: Pamela [name] pv was 11 19  Here for  AWV  Since her last visit her husband is passed away from cancer in Aug 19, 2023 and she is an active bereavement initially with hospice counselor and group but those activities were shut down with a coronavirus restrictions.  She does have support system neighbors and family but is understandably having difficulty missing her husband having to do those things that he used to do taking over the household responsibilities.  Often very sad sometimes anxious but no panic attacks. Sleep mostly 7 hours  Activity is gardening taking the dog for walk which is helpful. Her physical health is the same and healthy except she may have gained weight with mood eating.  Hearing: "good "  Vision:  No limitations at present . Last eye check UTD glasses   Safety:  Has smoke detector and wears seat belts.  . No excess sun exposure. Sees dentist regularly.  Falls: no  Advance directive :  Reviewed  Has one.  Memory: Felt to be good  , no concern from her or her family.  Depression: No anhedonia unusual crying or depressive symptoms  related to grief  functioning but gets anxious with   Nutrition: Eats well balanced diet; adequate calcium and vitamin D. No swallowing chewing problems.  Injury: no major injuries in the last six months.  Other  healthcare providers:  Reviewed today . Not needed   Social:   Widowed 19-Aug-2023    Pet dog   Preventive parameters: up-to-date  Reviewed   ADLS:   There are no problems or need for assistance  driving, feeding, obtaining food, dressing, toileting and bathing, managing money using phone. She is independent. Recently widowed   EXERCISE/ HABITS  Per week   No tobacco    etoh1-2 per day   Health Maintenance  Topic Date Due  . INFLUENZA VACCINE  03/26/2019  . MAMMOGRAM  06/25/2020  . COLONOSCOPY  07/22/2025  . TETANUS/TDAP  01/13/2027  . DEXA SCAN  Completed  . Hepatitis C Screening  Completed  . PNA vac Low Risk Adult  Completed      14. Screening: Patient provided with a written and personalized 5-10 year screening schedule in the AVS.   Pv exam and lipids in fall  When due     15. Care team Update:  NA     2.) Review of Medical History: -PMH, PSH, Family History and current specialty and care providers reviewed and updated and listed below   Past Medical History:  Diagnosis Date  . Arthritis KNEES, HANDS, SHOULDERS  . Depression   . Hx of Clostridium difficile infection   . Hyperlipidemia, mixed   . Impingement syndrome of right shoulder   . Intestinal bacterial overgrowth 06/20/2013   Under rx per dr Collene Mares   . Skin cancer    Tonia Brooms   . Vaginitis, atrophic    recurrent  Past Surgical History:  Procedure Laterality Date  . ABDOMINAL HYSTERECTOMY  1992   W/ BILATERAL SALPINGO-OOPHORECTOMY  . CARPAL TUNNEL RELEASE    . SHOULDER ARTHROSCOPY WITH ROTATOR CUFF REPAIR  08/06/2012   Procedure: SHOULDER ARTHROSCOPY WITH ROTATOR CUFF REPAIR;  Surgeon: Magnus Sinning, MD;  Location: [facility];  Service: Orthopedics;  Laterality: Right;  . SHOULDER ARTHROSCOPY WITH SUBACROMIAL DECOMPRESSION  08/06/2012   Procedure: SHOULDER ARTHROSCOPY WITH SUBACROMIAL DECOMPRESSION;  Surgeon: Magnus Sinning, MD;  Location: [facility];  Service:  Orthopedics;  Laterality: Right;  debridement of labral  . TONSILLECTOMY  AS CHILD    Social History   Socioeconomic History  . Marital status: Married    Spouse name: Not on file  . Number of children: Not on file  . Years of education: Not on file  . Highest education level: Not on file  Occupational History  . Occupation: retired    Fish farm manager: RETIRED  Social Needs  . Financial resource strain: Not on file  . Food insecurity:    Worry: Not on file    Inability: Not on file  . Transportation needs:    Medical: Not on file    Non-medical: Not on file  Tobacco Use  . Smoking status: Never Smoker  . Smokeless tobacco: Never Used  Substance and Sexual Activity  . Alcohol use: Yes    Comment: OCCASIONAL  . Drug use: No  . Sexual activity: Not on file  Lifestyle  . Physical activity:    Days per week: Not on file    Minutes per session: Not on file  . Stress: Not on file  Relationships  . Social connections:    Talks on phone: Not on file    Gets together: Not on file    Attends religious service: Not on file    Active member of club or organization: Not on file    Attends meetings of clubs or organizations: Not on file    Relationship status: Not on file  . Intimate partner violence:    Fear of current or ex partner: Not on file    Emotionally abused: Not on file    Physically abused: Not on file    Forced sexual activity: Not on file  Other Topics Concern  . 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  Has smoke detector and wears seat belts.  No firearms. No excess sun exposure. Sees dentist regularly .   Dexa nl 10-28-03 and 10-28-2006   Husband inhpsicde care for  Throat  Cancer  10-27-17 passed away 07-29-2018    Family History  Problem Relation Age of Onset  . Arthritis Sister         CPPD autoinmmune  . Hyperlipidemia Other         family hx  . Hypertension Other        family hx  . Sudden death Other        family hx  . Heart disease Other        family hx  . Multiple myeloma Father        deceased  . Stroke Mother 67    Current Outpatient Medications on File Prior to Visit  Medication Sig Dispense Refill  . cholecalciferol (  VITAMIN D3) 25 MCG (1000 UT) tablet Take 1,000 Units by mouth daily.    Marland Kitchen conjugated estrogens (PREMARIN) vaginal cream USE VAGINALLY AS DIRECTED 30 g 6   No current facility-administered medications on file prior to visit.     Marland Kitchen  4.) Physical Exam  There were no vitals filed for this visit. Estimated body mass index is 27.66 kg/m as calculated from the following:   Height as of 07/05/18: 5' 4.25" (1.632 m).   Weight as of 07/05/18: 162 lb 6.4 oz (73.7 kg).  See patient instructions for recommendations.  Education and counseling regarding the above review of health provided with a plan for the following: -see scanned patient completed form for further details -fall prevention strategies discussed  -healthy lifestyle discussed -importance and resources for completing advanced directives discussed -see patient instructions below for any other recommendations provided  4)The following written screening schedule of preventive measures were reviewed with assessment and plan made per below, orders and patient instructions:      AAA screening done if applicable     Alcohol screening done     Obesity Screening and counseling done     STI screening (Hep C if born [birth year]) offered and per pt wishes     Tobacco Screening done done   7.) Summary: -risk factors and conditions per above assessment were discussed and treatment, recommendations and referrals were offered per documentation above and orders and patient instructions.  Medicare annual wellness visit, subsequent  Medication management  Adjustment disorder with other symptom  Recent bereavement  Patient Instructions   Increase the sertraline trial 150 mg a day. Plan our OV video visit or my chart follow-up in 4 to 6 weeks as to plan for other follow-up. Plan preventive visit exam and lab work in November when she is due. Yearly flu vaccine Up-to-date on other parameters. Encouraged and discussed continued contact with hospice support. Send [place] a blood pressure reading and await to have current stats.    Shanon Ace, MD    ROS: See pertinent positives and negatives per HPI. No cv pulm sx   Gi some constipation   Past Medical History:  Diagnosis Date  . Arthritis KNEES, HANDS, SHOULDERS  . Depression   . Hx of Clostridium difficile infection   . Hyperlipidemia, mixed   . Impingement syndrome of right shoulder   . Intestinal bacterial overgrowth 06/20/2013   Under rx per dr Collene Mares   . Skin cancer    Tonia Brooms   . Vaginitis, atrophic    recurrent    Past Surgical History:  Procedure Laterality Date  . ABDOMINAL HYSTERECTOMY  1992   W/ BILATERAL SALPINGO-OOPHORECTOMY  . CARPAL TUNNEL RELEASE    . SHOULDER ARTHROSCOPY WITH ROTATOR CUFF REPAIR  08/06/2012   Procedure: SHOULDER ARTHROSCOPY WITH ROTATOR CUFF REPAIR;  Surgeon: Magnus Sinning, MD;  Location: [facility];  Service: Orthopedics;  Laterality: Right;  . SHOULDER ARTHROSCOPY WITH SUBACROMIAL DECOMPRESSION  08/06/2012   Procedure: SHOULDER ARTHROSCOPY WITH SUBACROMIAL DECOMPRESSION;  Surgeon: Magnus Sinning, MD;  Location: [facility];  Service: Orthopedics;  Laterality: Right;  debridement of labral  . TONSILLECTOMY  AS CHILD    Family History  Problem Relation Age of Onset  . Arthritis Sister         CPPD autoinmmune  . Hyperlipidemia Other        family hx  . Hypertension Other        family hx  . Sudden death  Other        family hx  . Heart disease Other        family hx  . Multiple myeloma Father        deceased  . Stroke Mother 39    Social History   Socioeconomic History  .  Marital status: Married    Spouse name: Not on file  . Number of children: Not on file  . Years of education: Not on file  . Highest education level: Not on file  Occupational History  . Occupation: retired    Fish farm manager: RETIRED  Social Needs  . Financial resource strain: Not on file  . Food insecurity:    Worry: Not on file    Inability: Not on file  . Transportation needs:    Medical: Not on file    Non-medical: Not on file  Tobacco Use  . Smoking status: Never Smoker  . Smokeless tobacco: Never Used  Substance and Sexual Activity  . Alcohol use: Yes    Comment: OCCASIONAL  . Drug use: No  . Sexual activity: Not on file  Lifestyle  . Physical activity:    Days per week: Not on file    Minutes per session: Not on file  . Stress: Not on file  Relationships  . Social connections:    Talks on phone: Not on file    Gets together: Not on file    Attends religious service: Not on file    Active member of club or organization: Not on file    Attends meetings of clubs or organizations: Not on file    Relationship status: Not on file  Other Topics Concern  . 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  Has smoke detector and wears seat belts.  No firearms. No excess sun exposure. Sees dentist regularly .   Dexa nl Nov 22, 2003 and 11/22/06   Husband inhpsicde care for  Throat  Cancer  11-21-17 passed away 08-23-18      Current Outpatient Medications:  .  cholecalciferol (VITAMIN D3) 25 MCG (1000 UT) tablet, Take 1,000 Units by mouth daily., Disp: , Rfl:  .  conjugated estrogens (PREMARIN) vaginal cream, USE VAGINALLY AS DIRECTED, Disp: 30 g, Rfl: 6 .  sertraline (ZOLOFT) 100 MG tablet, Take 1.5 tablets (150 mg total) by mouth daily., Disp: 135 tablet, Rfl: 2  EXAM:  VITALS per patient if applicable:  GENERAL: alert, oriented, appears well and in no acute  distress  HEENT: atraumatic, conjunttiva clear, no obvious abnormalities on inspection of external nose and ears   PSYCH/NEURO: pleasant and cooperative, speech and thought processing grossly intact mildly subdued  Lab Results  Component Value Date   WBC 8.1 07/05/2018   HGB 13.2 07/05/2018   HCT 39.1 07/05/2018   PLT 262.0 07/05/2018   GLUCOSE 89 07/05/2018   CHOL 270 (H) 07/05/2018   TRIG 133.0 07/05/2018   HDL 70.10 07/05/2018   LDLDIRECT 173.5 06/13/2013   LDLCALC 173 (H) 07/05/2018   ALT 26 07/05/2018   AST 23 07/05/2018   NA 137 07/05/2018   K 4.4 07/05/2018   CL 101 07/05/2018   CREATININE 0.92 07/05/2018   BUN 25 (H) 07/05/2018   CO2 28 07/05/2018   TSH 2.33 07/05/2018    ASSESSMENT  AND PLAN:  Discussed the following assessment and plan:  Medicare annual wellness visit, subsequent  Medication management  Adjustment disorder with other symptom  Recent bereavement    Expectant management and discussion of plan and treatment with patient with opportunity to ask questions and all were answered. The patient agreed with the plan and demonstrated an understanding of the instructions.   The patient was advised to call back or seek an in-person evaluation if worsening having concerns   See avs instructions     Shanon Ace, MD

## 2018-12-07 ENCOUNTER — Ambulatory Visit (INDEPENDENT_AMBULATORY_CARE_PROVIDER_SITE_OTHER): Payer: Medicare Other | Admitting: Internal Medicine

## 2018-12-07 ENCOUNTER — Ambulatory Visit: Payer: Medicare Other

## 2018-12-07 ENCOUNTER — Encounter: Payer: Self-pay | Admitting: Internal Medicine

## 2018-12-07 ENCOUNTER — Other Ambulatory Visit: Payer: Self-pay

## 2018-12-07 DIAGNOSIS — F4329 Adjustment disorder with other symptoms: Secondary | ICD-10-CM

## 2018-12-07 DIAGNOSIS — Z79899 Other long term (current) drug therapy: Secondary | ICD-10-CM

## 2018-12-07 DIAGNOSIS — Z0000 Encounter for general adult medical examination without abnormal findings: Secondary | ICD-10-CM

## 2018-12-07 DIAGNOSIS — Z634 Disappearance and death of family member: Secondary | ICD-10-CM

## 2018-12-07 MED ORDER — SERTRALINE HCL 100 MG PO TABS: 150.0000 mg | ORAL_TABLET | Freq: Every day | ORAL | 2 refills | Status: DC

## 2018-12-07 NOTE — Patient Instructions (Addendum)
Increase the sertraline trial 150 mg a day. Plan our OV video visit or my chart follow-up in 4 to 6 weeks as to plan for other follow-up. Plan preventive visit exam and lab work in November when she is due. Yearly flu vaccine Up-to-date on other parameters. Encouraged and discussed continued contact with hospice support. Send [place] a blood pressure reading and await to have current stats.

## 2019-01-13 ENCOUNTER — Other Ambulatory Visit: Payer: Self-pay | Admitting: Internal Medicine

## 2019-02-01 NOTE — Telephone Encounter (Signed)
I agree with  staying gon the lower dose of  Sertraline 100 mg   It can cause dizziness .   How is the  Dizziness doing  On the lower dose  Of 100mg  ?   UPdate [place] on   How are the grief and depression symptoms are doing   .

## 2019-02-02 NOTE — Telephone Encounter (Signed)
If  You want to try we can add wellbutrin 150 x24 hours to the sertraline ( Or  Consider  other meds   If needed ) This  Can augment the antidepressant effect  .  Let us know

## 2019-04-29 IMAGING — [facility] DIGITAL DIAGNOSTIC UNILATERAL LEFT MAMMOGRAM WITH TOMO AND CAD
4 series · 4 of 12 positions shown · non-contrast
Comparison: June 25, 2018

CLINICAL DATA: 67-year-old patient recalled recent screening
mammogram for evaluation of a possible asymmetry left breast,
identified on the MLO view.

EXAM:
DIGITAL DIAGNOSTIC UNILATERAL LEFT MAMMOGRAM WITH CAD AND TOMO

[L ML synth-2D]
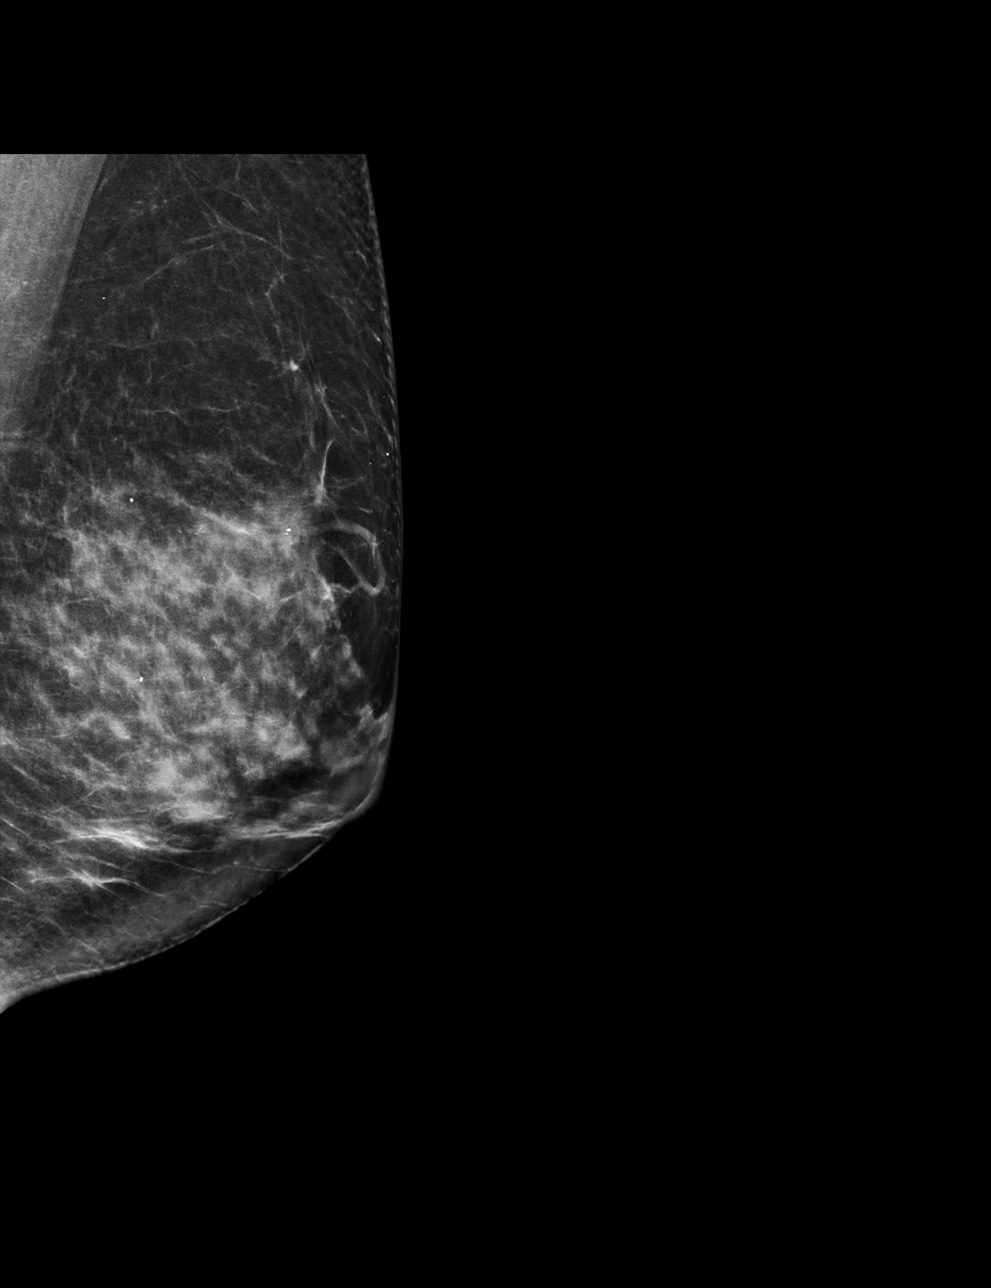

[L MLO synth-2D]
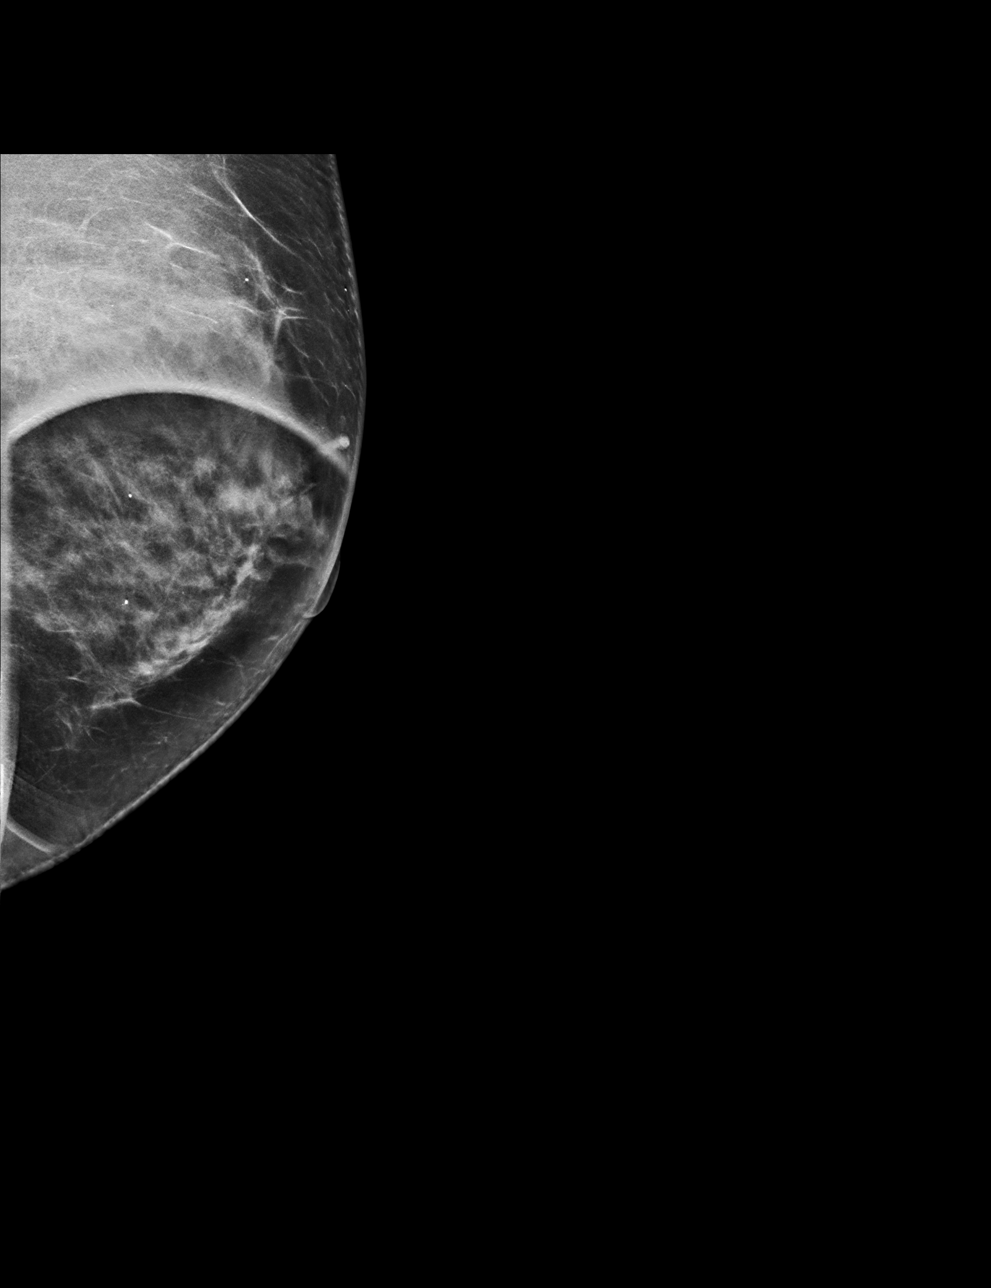

[L ML tomo · tomo slice 33/66.0]
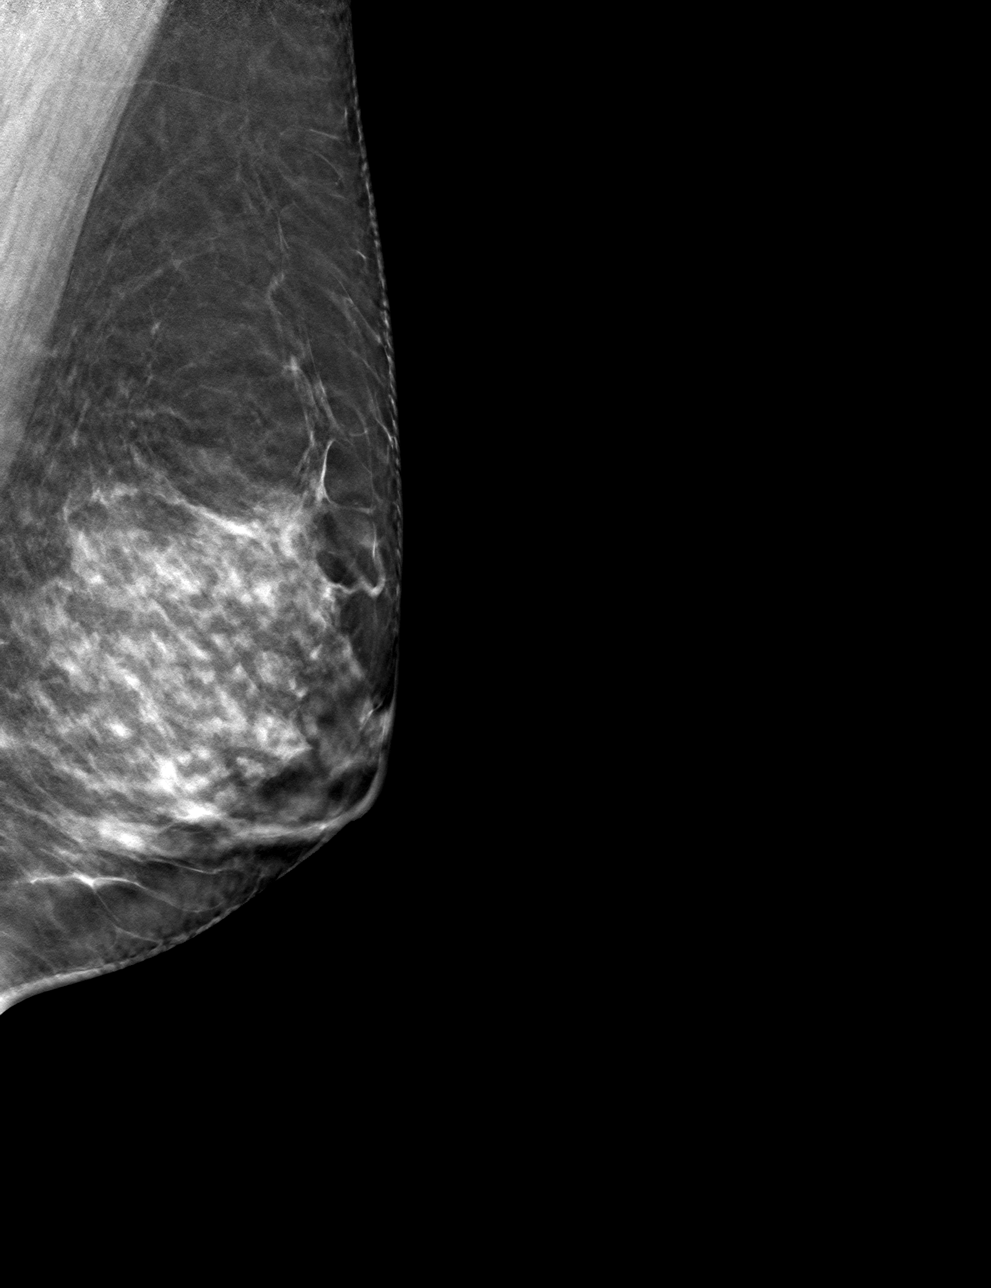

[L MLO tomo · tomo slice 33/64.0]
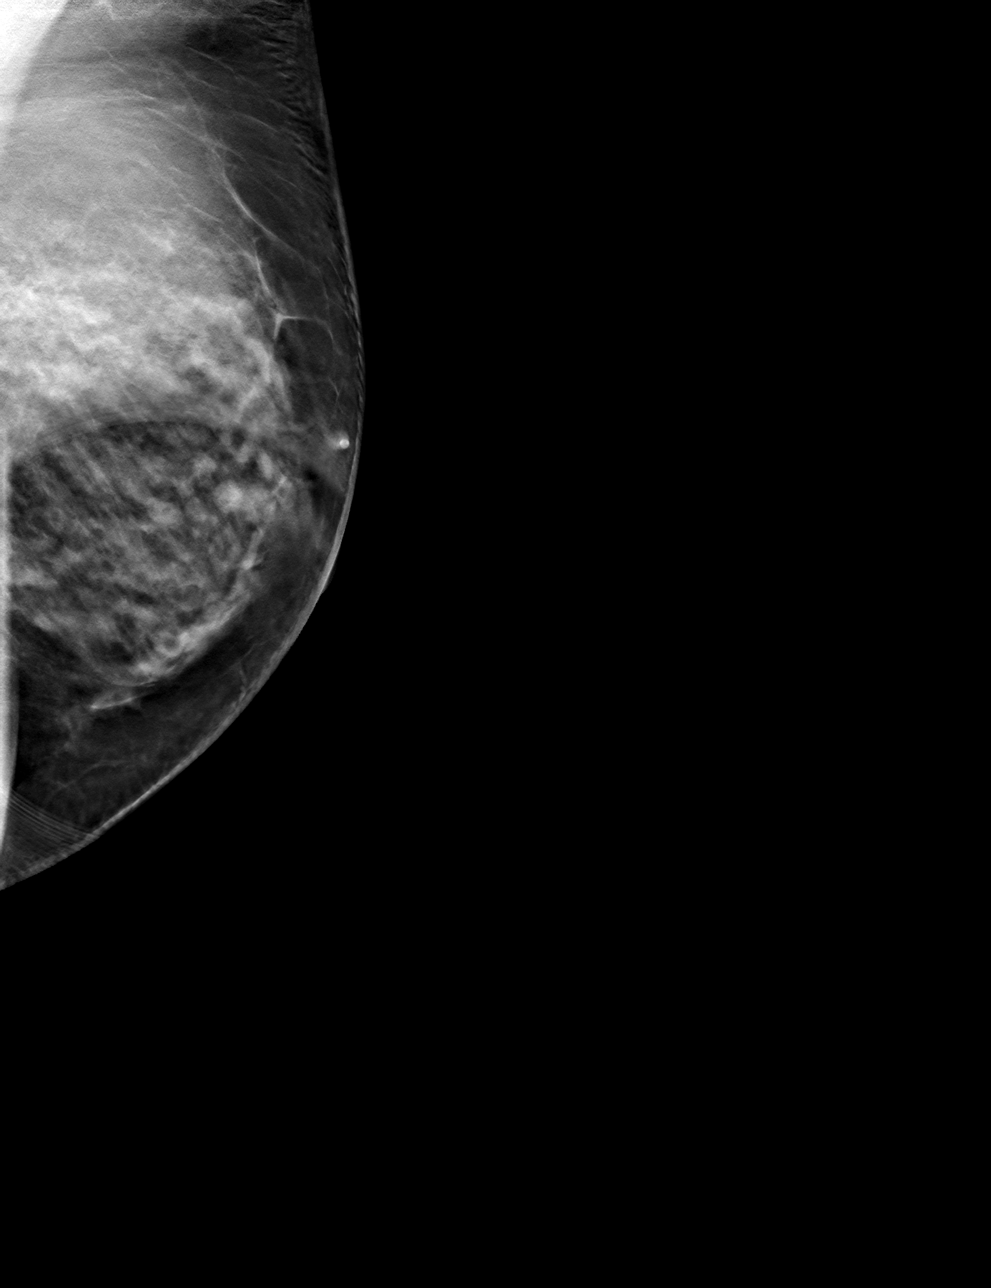

[4 of 12 positions shown; findings below may reference images not displayed]

ACR Breast Density Category c: The breast tissue is heterogeneously
dense, which may obscure small masses.
FINDINGS: Focal spot compression view of the retroareolar and inferior left
breast shows dispersion of fibroglandular tissue without evidence of
a mass or persistent asymmetry. A 90 degree lateral view of the left
breast is negative.

Mammographic images were processed with CAD.
IMPRESSION: No evidence of malignancy in the left breast.

RECOMMENDATION:
Screening mammogram in one year.(Code:LI-T-QCE)

I have discussed the findings and recommendations with the patient.
Results were also provided in writing at the conclusion of the
visit. If applicable, a reminder letter will be sent to the patient
regarding the next appointment.

BI-RADS CATEGORY  1: Negative.

## 2019-06-06 ENCOUNTER — Other Ambulatory Visit: Payer: Self-pay | Admitting: Internal Medicine

## 2019-06-06 DIAGNOSIS — Z1231 Encounter for screening mammogram for malignant neoplasm of breast: Secondary | ICD-10-CM

## 2019-06-15 ENCOUNTER — Ambulatory Visit (INDEPENDENT_AMBULATORY_CARE_PROVIDER_SITE_OTHER): Payer: Medicare Other

## 2019-06-15 ENCOUNTER — Other Ambulatory Visit: Payer: Self-pay

## 2019-06-15 DIAGNOSIS — Z23 Encounter for immunization: Secondary | ICD-10-CM

## 2019-06-16 ENCOUNTER — Telehealth (INDEPENDENT_AMBULATORY_CARE_PROVIDER_SITE_OTHER): Payer: Medicare Other | Admitting: Internal Medicine

## 2019-06-16 ENCOUNTER — Encounter: Payer: Self-pay | Admitting: Internal Medicine

## 2019-06-16 DIAGNOSIS — E785 Hyperlipidemia, unspecified: Secondary | ICD-10-CM

## 2019-06-16 DIAGNOSIS — M1991 Primary osteoarthritis, unspecified site: Secondary | ICD-10-CM | POA: Diagnosis not present

## 2019-06-16 DIAGNOSIS — Z79899 Other long term (current) drug therapy: Secondary | ICD-10-CM

## 2019-06-16 DIAGNOSIS — F4329 Adjustment disorder with other symptoms: Secondary | ICD-10-CM

## 2019-06-16 MED ORDER — SERTRALINE HCL 100 MG PO TABS: 100.0000 mg | ORAL_TABLET | Freq: Every day | ORAL | 1 refills | Status: DC

## 2019-06-16 NOTE — Progress Notes (Signed)
   Virtual Visit via Telephone Note  I connected with@ on 06/16/19 at  9:00 AM EDT by telephone and verified that I am speaking with the correct person using two identifiers.   I discussed the limitations, risks, security and privacy concerns of performing an evaluation and management service by telephone and the availability of in person appointments. I also discussed with the patient that there may be a patient responsible charge related to this service. The patient expressed understanding and agreed to proceed. Initial   televirtual but camera not working on her end   So  After 8 minutes attempts  transition to phone visit   Location patient: home Location provider:work or home office Participants present for the call: patient, provider Patient did not have a visit in the prior 7 days to address this/these issue(s).   History of Present Illness: Pamela [name]  presents for medical evaluation She is on 100 mg sertraline at this time and seems to be stable she is  Attending zoom groups  And individual counseling with Hospice that seems to be helpful   Has good days and bad   No suicidal or persistent hopeless ness. Looking forward to visit with nephew [place] who has invited her to move in or visit over long times.   tjis HOliday season.  No change in status    Observations/Objective: Patient sounds cheerful and well on the phone. I do not appreciate any SOB. Speech and thought processing are grossly intact. Patient reported vitals:  Assessment and Plan: Medication management - refill same dose seems to be helping and continued hospice support  - Plan: Basic metabolic panel, CBC with Differential/Platelet, Hepatic function panel, Lipid panel, TSH  Adjustment disorder with other symptom - bereavement  - Plan: Basic metabolic panel, CBC with Differential/Platelet, Hepatic function panel, Lipid panel, TSH  Hyperlipidemia, unspecified hyperlipidemia type - Plan: Basic metabolic  panel, CBC with Differential/Platelet, Hepatic function panel, Lipid panel, TSH  Primary osteoarthritis, unspecified site - Plan: Basic metabolic panel, CBC with Differential/Platelet, Hepatic function panel, Lipid panel, TSH    Follow Up Instructions: Return for fasting labs  and  then cpx end NOvemeber when due..  Continue same dose at this time  She has had flu vaccine and mammogram  [id number] [id number] [id number] [id number] 21-30 I did not refer this patient for an OV in the next 24 hours for this/these issue(s).  I discussed the assessment and treatment plan with the patient. The patient was provided an opportunity to ask questions and all were answered. The patient agreed with the plan and demonstrated an understanding of the instructions.   The patient was advised to call back or seek an in-person evaluation if the symptoms worsen  In interim   I provided 15  minutes of non-face-to-face time during this encounter.   Shanon Ace, MD

## 2019-06-17 NOTE — Telephone Encounter (Signed)
If you wish  Ok  to delayed cpx but there are times we can work in a physical    2 pm or 12 noon    on December 2 Wednesday . Let us know what  You want to do .

## 2019-07-07 ENCOUNTER — Other Ambulatory Visit (INDEPENDENT_AMBULATORY_CARE_PROVIDER_SITE_OTHER): Payer: Medicare Other

## 2019-07-07 ENCOUNTER — Other Ambulatory Visit: Payer: Self-pay

## 2019-07-07 DIAGNOSIS — E785 Hyperlipidemia, unspecified: Secondary | ICD-10-CM | POA: Diagnosis not present

## 2019-07-07 DIAGNOSIS — F4329 Adjustment disorder with other symptoms: Secondary | ICD-10-CM | POA: Diagnosis not present

## 2019-07-07 DIAGNOSIS — M1991 Primary osteoarthritis, unspecified site: Secondary | ICD-10-CM

## 2019-07-07 DIAGNOSIS — Z79899 Other long term (current) drug therapy: Secondary | ICD-10-CM

## 2019-07-07 LAB — LIPID PANEL
Cholesterol: 277 mg/dL — ABNORMAL HIGH (ref 0–200)
HDL: 77.6 mg/dL (ref >39.00)
LDL Cholesterol: 172 mg/dL — ABNORMAL HIGH (ref 0–99)
NonHDL: 198.96
Total CHOL/HDL Ratio: 4
Triglycerides: 133 mg/dL (ref 0.0–149.0)
VLDL: 26.6 mg/dL (ref 0.0–40.0)

## 2019-07-07 LAB — CBC WITH DIFFERENTIAL/PLATELET
Basophils Absolute: 0 10*3/uL (ref 0.0–0.1)
Basophils Relative: 0.7 % (ref 0.0–3.0)
Eosinophils Absolute: 0.1 10*3/uL (ref 0.0–0.7)
Eosinophils Relative: 1.2 % (ref 0.0–5.0)
HCT: 37.4 % (ref 36.0–46.0)
Hemoglobin: 12.6 g/dL (ref 12.0–15.0)
Lymphocytes Relative: 42.7 % (ref 12.0–46.0)
Lymphs Abs: 2.1 10*3/uL (ref 0.7–4.0)
MCHC: 33.6 g/dL (ref 30.0–36.0)
MCV: 81.4 fl (ref 78.0–100.0)
Monocytes Absolute: 0.4 10*3/uL (ref 0.1–1.0)
Monocytes Relative: 8.4 % (ref 3.0–12.0)
Neutro Abs: 2.3 10*3/uL (ref 1.4–7.7)
Neutrophils Relative %: 47 % (ref 43.0–77.0)
Platelets: 234 10*3/uL (ref 150.0–400.0)
RBC: 4.6 Mil/uL (ref 3.87–5.11)
RDW: 14.4 % (ref 11.5–15.5)
WBC: 5 10*3/uL (ref 4.0–10.5)

## 2019-07-07 LAB — BASIC METABOLIC PANEL
BUN: 20 mg/dL (ref 6–23)
CO2: 27 mEq/L (ref 19–32)
Calcium: 8.9 mg/dL (ref 8.4–10.5)
Chloride: 104 mEq/L (ref 96–112)
Creatinine, Ser: 0.92 mg/dL (ref 0.40–1.20)
GFR: 60.32 mL/min (ref >60.00)
Glucose, Bld: 83 mg/dL (ref 70–99)
Potassium: 4.6 mEq/L (ref 3.5–5.1)
Sodium: 139 mEq/L (ref 135–145)

## 2019-07-07 LAB — HEPATIC FUNCTION PANEL
ALT: 17 U/L (ref 0–35)
AST: 19 U/L (ref 0–37)
Albumin: 4.5 g/dL (ref 3.5–5.2)
Alkaline Phosphatase: 58 U/L (ref 39–117)
Bilirubin, Direct: 0.1 mg/dL (ref 0.0–0.3)
Total Bilirubin: 0.4 mg/dL (ref 0.2–1.2)
Total Protein: 6.4 g/dL (ref 6.0–8.3)

## 2019-07-07 LAB — TSH: TSH: 2.57 u[IU]/mL (ref 0.35–4.50)

## 2019-07-20 ENCOUNTER — Ambulatory Visit
Admission: RE | Admit: 2019-07-20 | Discharge: 2019-07-20 | Disposition: A | Discharge status: Home / Self Care | Payer: Medicare Other | Source: Ambulatory Visit | Attending: Internal Medicine | Admitting: Internal Medicine

## 2019-07-20 ENCOUNTER — Other Ambulatory Visit: Payer: Self-pay

## 2019-07-20 DIAGNOSIS — Z1231 Encounter for screening mammogram for malignant neoplasm of breast: Secondary | ICD-10-CM

## 2019-07-26 NOTE — Progress Notes (Signed)
Chief Complaint  Patient presents with  . Annual Exam    Pt has no concerns     HPI: Pamela [name] 68 y.o. comes in today for Preventive Medicare exam/ wellness visit . Doing on with sertraline 100 mg  Has family and hospice support .   goting to [place]  To family for a while leavibng mid December   Staying active and sleep ok  woul dlike to lose a few pounds  \knees stable   Health Maintenance  Topic Date Due  . MAMMOGRAM  07/19/2021  . COLONOSCOPY  07/22/2025  . TETANUS/TDAP  01/13/2027  . INFLUENZA VACCINE  Completed  . DEXA SCAN  Completed  . Hepatitis C Screening  Completed  . PNA vac Low Risk Adult  Completed   Health Maintenance Review LIFESTYLE:  Exercise:  Walks a good bit yoga stretchers  Tobacco/ETS:n Alcohol:  Sugar beverages:n Sleep:ok Drug use: no HH: 1       Hearing: stable   Vision:  No limitations at present . Last eye check UTD  Safety:  Has smoke detector and wears seat belts.  No firearms. No excess sun exposure. Sees dentist regularly.  Falls: n   Memory: Felt to be good  , no concern from her or her family.  Depression: No anhedonia unusual crying or depressive symptoms x bereavement and coping   Nutrition: Eats well balanced diet; adequate calcium and vitamin D. No swallowing chewing problems.  Injury: no major injuries in the last six months.  Other healthcare providers:  Reviewed today .   Preventive parameters: up-to-date  Reviewed   ADLS:   There are no problems or need for assistance  driving, feeding, obtaining food, dressing, toileting and bathing, managing money using phone. She is independent.   ROS:  GEN/ HEENT: No fever, significant weight changes sweats headaches vision problems hearing changes, CV/ PULM; No chest pain shortness of breath cough, syncope,edema  change in exercise tolerance. GI /GU: No adominal pain, vomiting, change in bowel habits. No blood in the stool. No significant GU symptoms.  SKIN/HEME: ,no acute skin rashes suspicious lesions or bleeding. No lymphadenopathy, nodules, masses.  NEURO/ PSYCH:  No neurologic signs such as weakness numbness. No depression anxiety. IMM/ Allergy: No unusual infections.  Allergy .   REST of 12 system review negative except as per HPI   Past Medical History:  Diagnosis Date  . Arthritis KNEES, HANDS, SHOULDERS  . Depression   . Hx of Clostridium difficile infection   . Hyperlipidemia, mixed   . Impingement syndrome of right shoulder   . Intestinal bacterial overgrowth 06/20/2013   Under rx per dr Collene Mares   . Skin cancer    Tonia Brooms   . Vaginitis, atrophic    recurrent    Family History  Problem Relation Age of Onset  . Arthritis Sister         CPPD autoinmmune  . Hyperlipidemia Other        family hx  . Hypertension Other        family hx  . Sudden death Other        family hx  . Heart disease Other        family hx  . Multiple myeloma Father        deceased  . Stroke Mother 60    Social History   Socioeconomic History  . Marital status: Widowed    Spouse name: Not on file  . Number of children: Not on file  .  Years of education: Not on file  . Highest education level: Not on file  Occupational History  . Occupation: retired    Fish farm manager: RETIRED  Social Needs  . Financial resource strain: Not on file  . Food insecurity    Worry: Not on file    Inability: Not on file  . Transportation needs    Medical: Not on file    Non-medical: Not on file  Tobacco Use  . Smoking status: Never Smoker  . Smokeless tobacco: Never Used  Substance and Sexual Activity  . Alcohol use: Yes    Comment: OCCASIONAL  . Drug use: No  . Sexual activity: Not on file  Lifestyle  . Physical activity    Days per week: Not on file    Minutes per session: Not on file  . Stress: Not on file  Relationships  . Social Herbalist on phone: Not on file    Gets together: Not on file    Attends religious service: Not on file     Active member of club or organization: Not on file    Attends meetings of clubs or organizations: Not on file    Relationship status: Not on file  Other Topics Concern  . 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  Has smoke detector and wears seat belts.  No firearms. No excess sun exposure. Sees dentist regularly .   Dexa nl 10-27-2003 and 10/27/2006   Husband inhpsicde care for  Throat  Cancer  10-26-17 passed away 07/28/18    Outpatient Encounter Medications as of 2019/07/29  Medication Sig  . cholecalciferol (VITAMIN D3) 25 MCG (1000 UT) tablet Take 1,000 Units by mouth daily.  Marland Kitchen conjugated estrogens (PREMARIN) vaginal cream USE VAGINALLY AS DIRECTED  . sertraline (ZOLOFT) 100 MG tablet Take 1 tablet (100 mg total) by mouth daily.   No facility-administered encounter medications on file as of 29-Jul-2019.     EXAM:  BP 118/64 (BP Location: Right Arm, Patient Position: Sitting, Cuff Size: Normal)   Pulse 86   Temp 97.6 F (36.4 C) (Temporal)   Ht '5\' 4"'  (1.626 m)   Wt 165 lb 3.2 oz (74.9 kg)   SpO2 97%   BMI 28.36 kg/m   Body mass index is 28.36 kg/m.  Physical Exam: Vital signs reviewed ZOX:WRUE is a well-developed well-nourished alert cooperative   who appears stated age in no acute distress.  HEENT: normocephalic atraumatic , Eyes: PERRL EOM's full, conjunctiva clear, ., Ears: no deformity EAC's clear TMs with normal landmarks. Mouth:masked . NECK: supple without masses, thyromegaly or bruits. CHEST/PULM:  Clear to auscultation and percussion breath sounds equal no wheeze , rales or rhonchi. No chest wall deformities or tenderness.Breast: normal by inspection . No dimpling, discharge, masses, tenderness or discharge . CV: PMI is nondisplaced, S1 S2 no gallops, murmurs, rubs. Peripheral pulses are full without delay.No JVD .  ABDOMEN: Bowel sounds  normal nontender  No guard or rebound, no hepato splenomegal no CVA tenderness.   Extremtities:  No clubbing cyanosis or edema, no acute joint swelling or redness no focal atrophy  NEURO:  Oriented x3, cranial nerves 3-12 appear to be intact, no obvious focal weakness,gait within normal limits no abnormal reflexes or asymmetrical SKIN: No acute rashes  normal turgor, color, no bruising or petechiae. PSYCH: Oriented, good eye contact, no obvious depression anxiety, cognition and judgment appear normal. LN: no cervical axillary inguinal adenopathy No noted deficits in memory, attention, and speech. The 10-year ASCVD risk score Mikey Bussing DC Brooke Bonito., et al., 2013) is: 8.4%   Values used to calculate the score:     Age: 42 years     Sex: Female     Is Non-Hispanic African American: No     Diabetic: No     Tobacco smoker: No     Systolic Blood Pressure: 053 mmHg     Is BP treated: No     HDL Cholesterol: 77.6 mg/dL     Total Cholesterol: 277 mg/dL Depression screen [facility] 2/9 07/27/2019 12/07/2018 07/05/2018 12/04/2017 06/30/2017  Decreased Interest 0 1 0 0 0  Down, Depressed, Hopeless 0 1 0 0 0  PHQ - 2 Score 0 2 0 0 0  Altered sleeping - - - - -  Tired, decreased energy - - - - -  Change in appetite - - - - -  Feeling bad or failure about yourself  - - - - -  Trouble concentrating - - - - -  Moving slowly or fidgety/restless - - - - -  Suicidal thoughts - - - - -  PHQ-9 Score - - - - -  Difficult doing work/chores - - - - -     Lab Results  Component Value Date   WBC 5.0 07/07/2019   HGB 12.6 07/07/2019   HCT 37.4 07/07/2019   PLT 234.0 07/07/2019   GLUCOSE 83 07/07/2019   CHOL 277 (H) 07/07/2019   TRIG 133.0 07/07/2019   HDL 77.60 07/07/2019   LDLDIRECT 173.5 06/13/2013   LDLCALC 172 (H) 07/07/2019   ALT 17 07/07/2019   AST 19 07/07/2019   NA 139 07/07/2019   K 4.6 07/07/2019   CL 104 07/07/2019   CREATININE 0.92 07/07/2019   BUN 20 07/07/2019   CO2 27 07/07/2019   TSH 2.57  07/07/2019  ASSESSMENT AND PLAN:  Discussed the following assessment and plan:  Visit for preventive health examination  Hyperlipidemia, unspecified hyperlipidemia type  Adjustment disorder with other symptom  Medication management  Recent bereavement Stable and coping    Reviewed data and utd  Can de dexa in 2022 consdier crestor or zetia but next year   Vs cac score   Not high risk at this time x age  Planning losing a few pounds which will help  Patient Care Team: Kadra Kohan, Standley Brooking, MD as PCP - General Paralee Cancel, MD (Orthopedic Surgery) Juanita Craver, MD as Attending Physician (Gastroenterology) Paralee Cancel, MD as Consulting Physician (Orthopedic Surgery)  Patient Instructions  Can consdier   crestor  In future  or zetia  For cholesterol control .  Continue lifestyle intervention healthy eating and exercise .  Plan cpx  In a year   But can do med evaluation if needed  Any  Time.    The 10-year ASCVD risk score Mikey Bussing DC Brooke Bonito., et al., 2013) is: 8.4%   Values used to calculate the score:     Age: 47 years     Sex: Female     Is Non-Hispanic African American: No     Diabetic: No     Tobacco smoker: No     Systolic Blood Pressure: 976 mmHg     Is BP treated: No     HDL Cholesterol: 77.6 mg/dL  Total Cholesterol: 277 mg/dL  Health Maintenance, Female Adopting a healthy lifestyle and getting preventive care are important in promoting health and wellness. Ask your health care provider about:  The right schedule for you to have regular tests and exams.  Things you can do on your own to prevent diseases and keep yourself healthy. What should I know about diet, weight, and exercise? Eat a healthy diet   Eat a diet that includes plenty of vegetables, fruits, low-fat dairy products, and lean protein.  Do not eat a lot of foods that are high in solid fats, added sugars, or sodium. Maintain a healthy weight Body mass index (BMI) is used to identify weight problems. It  estimates body fat based on height and weight. Your health care provider can help determine your BMI and help you achieve or maintain a healthy weight. Get regular exercise Get regular exercise. This is one of the most important things you can do for your health. Most adults should:  Exercise for at least 150 minutes each week. The exercise should increase your heart rate and make you sweat (moderate-intensity exercise).  Do strengthening exercises at least twice a week. This is in addition to the moderate-intensity exercise.  Spend less time sitting. Even light physical activity can be beneficial. Watch cholesterol and blood lipids Have your blood tested for lipids and cholesterol at 70 years of age, then have this test every 5 years. Have your cholesterol levels checked more often if:  Your lipid or cholesterol levels are high.  You are older than 70 years of age.  You are at high risk for heart disease. What should I know about cancer screening? Depending on your health history and family history, you may need to have cancer screening at various ages. This may include screening for:  Breast cancer.  Cervical cancer.  Colorectal cancer.  Skin cancer.  Lung cancer. What should I know about heart disease, diabetes, and high blood pressure? Blood pressure and heart disease  High blood pressure causes heart disease and increases the risk of stroke. This is more likely to develop in people who have high blood pressure readings, are of African descent, or are overweight.  Have your blood pressure checked: ? Every 3-5 years if you are 47-105 years of age. ? Every year if you are 6 years old or older. Diabetes Have regular diabetes screenings. This checks your fasting blood sugar level. Have the screening done:  Once every three years after age 63 if you are at a normal weight and have a low risk for diabetes.  More often and at a younger age if you are overweight or have a high  risk for diabetes. What should I know about preventing infection? Hepatitis B If you have a higher risk for hepatitis B, you should be screened for this virus. Talk with your health care provider to find out if you are at risk for hepatitis B infection. Hepatitis C Testing is recommended for:  Everyone born from [birth year] through 1967.  Anyone with known risk factors for hepatitis C. Sexually transmitted infections (STIs)  Get screened for STIs, including gonorrhea and chlamydia, if: ? You are sexually active and are younger than 70 years of age. ? You are older than 70 years of age and your health care provider tells you that you are at risk for this type of infection. ? Your sexual activity has changed since you were last screened, and you are at increased risk for chlamydia or gonorrhea.  Ask your health care provider if you are at risk.  Ask your health care provider about whether you are at high risk for HIV. Your health care provider may recommend a prescription medicine to help prevent HIV infection. If you choose to take medicine to prevent HIV, you should first get tested for HIV. You should then be tested every 3 months for as long as you are taking the medicine. Pregnancy  If you are about to stop having your period (premenopausal) and you may become pregnant, seek counseling before you get pregnant.  Take 400 to 800 micrograms (mcg) of folic acid every day if you become pregnant.  Ask for birth control (contraception) if you want to prevent pregnancy. Osteoporosis and menopause Osteoporosis is a disease in which the bones lose minerals and strength with aging. This can result in bone fractures. If you are 56 years old or older, or if you are at risk for osteoporosis and fractures, ask your health care provider if you should:  Be screened for bone loss.  Take a calcium or vitamin D supplement to lower your risk of fractures.  Be given hormone replacement therapy (HRT) to treat  symptoms of menopause. Follow these instructions at home: Lifestyle  Do not use any products that contain nicotine or tobacco, such as cigarettes, e-cigarettes, and chewing tobacco. If you need help quitting, ask your health care provider.  Do not use street drugs.  Do not share needles.  Ask your health care provider for help if you need support or information about quitting drugs. Alcohol use  Do not drink alcohol if: ? Your health care provider tells you not to drink. ? You are pregnant, may be pregnant, or are planning to become pregnant.  If you drink alcohol: ? Limit how much you use to 0-1 drink a day. ? Limit intake if you are breastfeeding.  Be aware of how much alcohol is in your drink. In the [place] one drink equals one 12 oz bottle of beer (355 mL), one 5 oz glass of wine (148 mL), or one 1 oz glass of hard liquor (44 mL). General instructions  Schedule regular health, dental, and eye exams.  Stay current with your vaccines.  Tell your health care provider if: ? You often feel depressed. ? You have ever been abused or do not feel safe at home. Summary  Adopting a healthy lifestyle and getting preventive care are important in promoting health and wellness.  Follow your health care provider's instructions about healthy diet, exercising, and getting tested or screened for diseases.  Follow your health care provider's instructions on monitoring your cholesterol and blood pressure. This information is not intended to replace advice given to you by your health care provider. Make sure you discuss any questions you have with your health care provider. Document Released: 02/24/2011 Document Revised: 08/04/2018 Document Reviewed: 08/04/2018 Elsevier Patient Education  2020 [place] Ura Yingling M.D.

## 2019-07-27 ENCOUNTER — Other Ambulatory Visit: Payer: Self-pay

## 2019-07-27 ENCOUNTER — Encounter: Payer: Self-pay | Admitting: Internal Medicine

## 2019-07-27 ENCOUNTER — Ambulatory Visit (INDEPENDENT_AMBULATORY_CARE_PROVIDER_SITE_OTHER): Payer: Medicare Other | Admitting: Internal Medicine

## 2019-07-27 VITALS — BP 118/64 | HR 86 | Temp 97.6°F | Ht 64.0 in | Wt 165.2 lb

## 2019-07-27 DIAGNOSIS — Z0000 Encounter for general adult medical examination without abnormal findings: Secondary | ICD-10-CM | POA: Diagnosis not present

## 2019-07-27 DIAGNOSIS — F4329 Adjustment disorder with other symptoms: Secondary | ICD-10-CM

## 2019-07-27 DIAGNOSIS — E785 Hyperlipidemia, unspecified: Secondary | ICD-10-CM | POA: Diagnosis not present

## 2019-07-27 DIAGNOSIS — Z634 Disappearance and death of family member: Secondary | ICD-10-CM

## 2019-07-27 DIAGNOSIS — Z79899 Other long term (current) drug therapy: Secondary | ICD-10-CM

## 2019-07-27 NOTE — Patient Instructions (Addendum)
Can consdier   crestor  In future  or zetia  For cholesterol control .  Continue lifestyle intervention healthy eating and exercise .  Plan cpx  In a year   But can do med evaluation if needed  Any  Time.    The 10-year ASCVD risk score Mikey Bussing DC Brooke Bonito., et al., 2013) is: 8.4%   Values used to calculate the score:     Age: 68 years     Sex: Female     Is Non-Hispanic African American: No     Diabetic: No     Tobacco smoker: No     Systolic Blood Pressure: 123456 mmHg     Is BP treated: No     HDL Cholesterol: 77.6 mg/dL     Total Cholesterol: 277 mg/dL  Health Maintenance, Female Adopting a healthy lifestyle and getting preventive care are important in promoting health and wellness. Ask your health care provider about:  The right schedule for you to have regular tests and exams.  Things you can do on your own to prevent diseases and keep yourself healthy. What should I know about diet, weight, and exercise? Eat a healthy diet   Eat a diet that includes plenty of vegetables, fruits, low-fat dairy products, and lean protein.  Do not eat a lot of foods that are high in solid fats, added sugars, or sodium. Maintain a healthy weight Body mass index (BMI) is used to identify weight problems. It estimates body fat based on height and weight. Your health care provider can help determine your BMI and help you achieve or maintain a healthy weight. Get regular exercise Get regular exercise. This is one of the most important things you can do for your health. Most adults should:  Exercise for at least 150 minutes each week. The exercise should increase your heart rate and make you sweat (moderate-intensity exercise).  Do strengthening exercises at least twice a week. This is in addition to the moderate-intensity exercise.  Spend less time sitting. Even light physical activity can be beneficial. Watch cholesterol and blood lipids Have your blood tested for lipids and cholesterol at 70 years of  age, then have this test every 5 years. Have your cholesterol levels checked more often if:  Your lipid or cholesterol levels are high.  You are older than 70 years of age.  You are at high risk for heart disease. What should I know about cancer screening? Depending on your health history and family history, you may need to have cancer screening at various ages. This may include screening for:  Breast cancer.  Cervical cancer.  Colorectal cancer.  Skin cancer.  Lung cancer. What should I know about heart disease, diabetes, and high blood pressure? Blood pressure and heart disease  High blood pressure causes heart disease and increases the risk of stroke. This is more likely to develop in people who have high blood pressure readings, are of African descent, or are overweight.  Have your blood pressure checked: ? Every 3-5 years if you are 57-57 years of age. ? Every year if you are 33 years old or older. Diabetes Have regular diabetes screenings. This checks your fasting blood sugar level. Have the screening done:  Once every three years after age 60 if you are at a normal weight and have a low risk for diabetes.  More often and at a younger age if you are overweight or have a high risk for diabetes. What should I know about preventing infection?  Hepatitis B If you have a higher risk for hepatitis B, you should be screened for this virus. Talk with your health care provider to find out if you are at risk for hepatitis B infection. Hepatitis C Testing is recommended for:  Everyone born from [birth year] through 1967.  Anyone with known risk factors for hepatitis C. Sexually transmitted infections (STIs)  Get screened for STIs, including gonorrhea and chlamydia, if: ? You are sexually active and are younger than 70 years of age. ? You are older than 70 years of age and your health care provider tells you that you are at risk for this type of infection. ? Your sexual activity has  changed since you were last screened, and you are at increased risk for chlamydia or gonorrhea. Ask your health care provider if you are at risk.  Ask your health care provider about whether you are at high risk for HIV. Your health care provider may recommend a prescription medicine to help prevent HIV infection. If you choose to take medicine to prevent HIV, you should first get tested for HIV. You should then be tested every 3 months for as long as you are taking the medicine. Pregnancy  If you are about to stop having your period (premenopausal) and you may become pregnant, seek counseling before you get pregnant.  Take 400 to 800 micrograms (mcg) of folic acid every day if you become pregnant.  Ask for birth control (contraception) if you want to prevent pregnancy. Osteoporosis and menopause Osteoporosis is a disease in which the bones lose minerals and strength with aging. This can result in bone fractures. If you are 79 years old or older, or if you are at risk for osteoporosis and fractures, ask your health care provider if you should:  Be screened for bone loss.  Take a calcium or vitamin D supplement to lower your risk of fractures.  Be given hormone replacement therapy (HRT) to treat symptoms of menopause. Follow these instructions at home: Lifestyle  Do not use any products that contain nicotine or tobacco, such as cigarettes, e-cigarettes, and chewing tobacco. If you need help quitting, ask your health care provider.  Do not use street drugs.  Do not share needles.  Ask your health care provider for help if you need support or information about quitting drugs. Alcohol use  Do not drink alcohol if: ? Your health care provider tells you not to drink. ? You are pregnant, may be pregnant, or are planning to become pregnant.  If you drink alcohol: ? Limit how much you use to 0-1 drink a day. ? Limit intake if you are breastfeeding.  Be aware of how much alcohol is in  your drink. In the [place] one drink equals one 12 oz bottle of beer (355 mL), one 5 oz glass of wine (148 mL), or one 1 oz glass of hard liquor (44 mL). General instructions  Schedule regular health, dental, and eye exams.  Stay current with your vaccines.  Tell your health care provider if: ? You often feel depressed. ? You have ever been abused or do not feel safe at home. Summary  Adopting a healthy lifestyle and getting preventive care are important in promoting health and wellness.  Follow your health care provider's instructions about healthy diet, exercising, and getting tested or screened for diseases.  Follow your health care provider's instructions on monitoring your cholesterol and blood pressure. This information is not intended to replace advice given to you by your  health care provider. Make sure you discuss any questions you have with your health care provider. Document Released: 02/24/2011 Document Revised: 08/04/2018 Document Reviewed: 08/04/2018 Elsevier Patient Education  2020 [employer].

## 2019-08-03 ENCOUNTER — Other Ambulatory Visit: Payer: Self-pay | Admitting: Internal Medicine

## 2020-04-04 ENCOUNTER — Other Ambulatory Visit: Payer: Self-pay

## 2020-04-04 ENCOUNTER — Ambulatory Visit (INDEPENDENT_AMBULATORY_CARE_PROVIDER_SITE_OTHER): Payer: Medicare PPO

## 2020-04-04 VITALS — Wt 159.0 lb

## 2020-04-04 DIAGNOSIS — Z0000 Encounter for general adult medical examination without abnormal findings: Secondary | ICD-10-CM | POA: Diagnosis not present

## 2020-04-04 NOTE — Progress Notes (Signed)
Virtual Visit via Telephone Note  I connected with  Pamela [name] on 04/04/20 at 11:00 AM EDT by telephone and verified that I am speaking with the correct person using two identifiers.  Medicare Annual Wellness visit completed telephonically due to Covid-19 pandemic.   Persons participating in this call: This Health Coach and this patient.   Location: Patient: Home Provider: Office   I discussed the limitations, risks, security and privacy concerns of performing an evaluation and management service by telephone and the availability of in person appointments. The patient expressed understanding and agreed to proceed.  Unable to perform video visit due to video visit attempted and failed and/or patient does not have video capability.   Some vital signs may be absent or patient reported.   Willette Brace, LPN    Subjective:   Pamela [name] is a 69 y.o. female who presents for Medicare Annual (Subsequent) preventive examination.  Review of Systems     Cardiac Risk Factors include: advanced age (>60mn, >>74women);dyslipidemia     Objective:    There were no vitals filed for this visit. There is no height or weight on file to calculate BMI.  Advanced Directives 04/04/2020 12/04/2017 05/20/2017 08/06/2012  Does Patient Have a Medical Advance Directive? Yes Yes Yes Patient would like information  [place] - - -  Copy of [place]? No - copy requested - - -  Would patient like information on creating a medical advance directive? - - - Advance directive packet given    Current Medications (verified) Outpatient Encounter Medications as of 04/04/2020  Medication Sig  . conjugated estrogens (PREMARIN) vaginal cream USE VAGINALLY AS INSTRUCTED  . sertraline (ZOLOFT) 100 MG tablet Take 1 tablet (100 mg total) by mouth daily.  . [DISCONTINUED] cholecalciferol (VITAMIN D3) 25 MCG (1000 UT)  tablet Take 1,000 Units by mouth daily.   No facility-administered encounter medications on file as of 04/04/2020.    Allergies (verified) Simvastatin and Atorvastatin   History: Past Medical History:  Diagnosis Date  . Arthritis KNEES, HANDS, SHOULDERS  . Depression   . Hx of Clostridium difficile infection   . Hyperlipidemia, mixed   . Impingement syndrome of right shoulder   . Intestinal bacterial overgrowth 06/20/2013   Under rx per dr MCollene Mares  . Skin cancer    GTonia Brooms  . Vaginitis, atrophic    recurrent   Past Surgical History:  Procedure Laterality Date  . ABDOMINAL HYSTERECTOMY  1992   W/ BILATERAL SALPINGO-OOPHORECTOMY  . CARPAL TUNNEL RELEASE    . SHOULDER ARTHROSCOPY WITH ROTATOR CUFF REPAIR  08/06/2012   Procedure: SHOULDER ARTHROSCOPY WITH ROTATOR CUFF REPAIR;  Surgeon: JMagnus Sinning MD;  [facility]  Service: Orthopedics;  Laterality: Right;  . SHOULDER ARTHROSCOPY WITH SUBACROMIAL DECOMPRESSION  08/06/2012   Procedure: SHOULDER ARTHROSCOPY WITH SUBACROMIAL DECOMPRESSION;  Surgeon: JMagnus Sinning MD;  [facility] [facility]  Service: Orthopedics;  Laterality: Right;  debridement of labral  . TONSILLECTOMY  AS CHILD   Family History  Problem Relation Age of Onset  . Arthritis Sister         CPPD autoinmmune  . Hyperlipidemia Other        family hx  . Hypertension Other        family hx  . Sudden death Other        family hx  . Heart disease Other  family hx  . Multiple myeloma Father        deceased  . Stroke Mother 21   Social History   Socioeconomic History  . Marital status: Widowed    Spouse name: Not on file  . Number of children: Not on file  . Years of education: Not on file  . Highest education level: Not on file  Occupational History  . Occupation: retired    Fish farm manager: RETIRED  Tobacco Use  . Smoking status: Never Smoker  . Smokeless tobacco: Never Used  Substance and Sexual  Activity  . Alcohol use: Yes    Comment: OCCASIONAL  . Drug use: No  . Sexual activity: Not on file  Other Topics Concern  . 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  Has smoke detector and wears seat belts.  No firearms. No excess sun exposure. Sees dentist regularly .   Dexa nl 11-14-2003 and 11-14-2006   Husband inhpsicde care for  Throat  Cancer  11/13/2017 passed away Aug 15, 2018   Social Determinants of Health   Financial Resource Strain: Low Risk   . Difficulty of Paying Living Expenses: Not hard at all  Food Insecurity: No Food Insecurity  . Worried About Charity fundraiser in the Last Year: Never true  . Ran Out of Food in the Last Year: Never true  Transportation Needs: No Transportation Needs  . Lack of Transportation (Medical): No  . Lack of Transportation (Non-Medical): No  Physical Activity: Sufficiently Active  . Days of Exercise per Week: 7 days  . Minutes of Exercise per Session: 30 min  Stress: No Stress Concern Present  . Feeling of Stress : Not at all  Social Connections: Moderately Isolated  . Frequency of Communication with Friends and Family: More than three times a week  . Frequency of Social Gatherings with Friends and Family: Three times a week  . Attends Religious Services: Never  . Active Member of Clubs or Organizations: Yes  . Attends Archivist Meetings: More than 4 times per year  . Marital Status: Widowed    Tobacco Counseling Counseling given: Not Answered   Clinical Intake:  Pre-visit preparation completed: Yes  Pain : No/denies pain     BMI - recorded: 28.36 Nutritional Status: BMI 25 -29 Overweight Nutritional Risks: None Diabetes: No  How often do you need to have someone help you when you read instructions, pamphlets, or other written materials from your doctor or pharmacy?: 1 -  Never  Diabetic?No  Interpreter Needed?: No  Information entered by :: Charlott Rakes, LPN   Activities of Daily Living In your present state of health, do you have any difficulty performing the following activities: 04/04/2020  Hearing? N  Vision? N  Difficulty concentrating or making decisions? N  Walking or climbing stairs? N  Dressing or bathing? N  Doing errands, shopping? N  Preparing Food and eating ? N  Using the Toilet? N  In the past six months, have you accidently leaked urine? N  Do you have problems with loss of bowel control? N  Managing your Medications? N  Managing your Finances? N  Housekeeping or managing your Housekeeping? N  Some recent data might be hidden    Patient Care Team: Burnis Medin, MD as  PCP - General Paralee Cancel, MD (Orthopedic Surgery) Juanita Craver, MD as Attending Physician (Gastroenterology) Paralee Cancel, MD as Consulting Physician (Orthopedic Surgery)  Indicate any recent Medical Services you may have received from other than [facility] providers in the past year (date may be approximate).     Assessment:   This is a routine wellness examination for Pamela [name].  Hearing/Vision screen  Hearing Screening   '125Hz'$  '250Hz'$  '500Hz'$  '1000Hz'$  '2000Hz'$  '3000Hz'$  '4000Hz'$  '6000Hz'$  '8000Hz'$   Right ear:           Left ear:           Comments: Pt denies any difficulty hearing at this times  Vision Screening Comments: Dr Sherral Hammers for annual eye exams  Dietary issues and exercise activities discussed: Current Exercise Habits: Home exercise routine, Time (Minutes): 30, Frequency (Times/Week): 7, Weekly Exercise (Minutes/Week): 210  Goals    . patient     Go to a yoga class; tomorrow afternoon at 4:30     . Patient Stated     Try to find some personal time very day;  Try to coordinate friendly visits May discuss with palliative care they do as well as meet with them yourself   May consider a massage! Write down things that you feel would be helpful May take a  class at [place] with the flow without a paddle.    . Patient Stated     Lose a few pounds      Depression Screen PHQ 2/9 Scores 04/04/2020 07/27/2019 12/07/2018 07/05/2018 12/04/2017 06/30/2017 05/20/2017  PHQ - 2 Score 0 0 2 0 0 0 2  PHQ- 9 Score - - - - - - -  Exception Documentation - - Patient refusal - - - -    Fall Risk Fall Risk  04/04/2020 07/27/2019 07/05/2018 12/04/2017 06/30/2017  Falls in the past year? 0 0 0 No No  Number falls in past yr: 0 0 0 - -  Injury with Fall? 0 0 0 - -  Risk for fall due to : Impaired vision - - - -  Follow up Falls prevention discussed - - - -    Any stairs in or around the home? Yes  If so, are there any without handrails? Yes   Home free of loose throw rugs in walkways, pet beds, electrical cords, etc? Yes  Adequate lighting in your home to reduce risk of falls? Yes   ASSISTIVE DEVICES UTILIZED TO PREVENT FALLS:  Life alert? No  Use of a cane, walker or w/c? No  Grab bars in the bathroom? No  Shower chair or bench in shower? No  Elevated toilet seat or a handicapped toilet? No   TIMED UP AND GO:  Was the test performed? No  Cognitive Function: MMSE - Mini Mental State Exam 12/04/2017 05/20/2017  Not completed: (No Data) (No Data)     6CIT Screen 04/04/2020  What Year? 0 points  What month? 0 points  Count back from 20 0 points  Months in reverse 0 points  Repeat phrase 0 points    Immunizations Immunization History  Administered Date(s) Administered  . Fluad Quad(high Dose 65+) 06/15/2019  . Influenza Split 07/15/2011, 06/15/2012  . Influenza Whole 07/06/2007, 06/07/2008, 05/16/2009, 05/20/2010  . Influenza, High Dose Seasonal PF 06/26/2015, 06/27/2016, 06/15/2017, 06/01/2018  . Influenza,inj,Quad PF,6+ Mos 06/20/2013, 06/21/2014  . Influenza-Unspecified 07/08/2018  . Pneumococcal Conjugate-13 06/21/2014  . Pneumococcal Polysaccharide-23 06/26/2015  . Td 07/06/2007, 01/12/2017  . Zoster 04/11/2010  .  Zoster  Recombinat (Shingrix) 05/10/2018    TDAP status: Up to date Flu Vaccine status: Up to date Pneumococcal vaccine status: Up to date Covid-19 vaccine status: Completed vaccines  Qualifies for Shingles Vaccine? Yes   Zostavax completed Yes   Shingrix Completed?: Yes  Screening Tests Health Maintenance  Topic Date Due  . COVID-19 Vaccine (1) Never done  . INFLUENZA VACCINE  03/25/2020  . MAMMOGRAM  07/19/2021  . COLONOSCOPY  07/22/2025  . TETANUS/TDAP  01/13/2027  . DEXA SCAN  Completed  . Hepatitis C Screening  Completed  . PNA vac Low Risk Adult  Completed    Health Maintenance  Health Maintenance Due  Topic Date Due  . COVID-19 Vaccine (1) Never done  . INFLUENZA VACCINE  03/25/2020    Colorectal cancer screening: Completed 07/23/15. Repeat every 10 years Mammogram status: Completed 07/20/19. Repeat every year Bone Density status: Completed 06/27/16. Results reflect: Bone density results: NORMAL. Repeat every 2 years.  Additional Screening:  Hepatitis C Screening: Completed 12/25/16  Vision Screening: Recommended annual ophthalmology exams for early detection of glaucoma and other disorders of the eye. Is the patient up to date with their annual eye exam?  Yes  Who is the provider or what is the name of the office in which the patient attends annual eye exams? Dr Sherral Hammers   Dental Screening: Recommended annual dental exams for proper oral hygiene  Community Resource Referral / Chronic Care Management: CRR required this visit?  No   CCM required this visit?  No      Plan:     I have personally reviewed and noted the following in the patient's chart:   . Medical and social history . Use of alcohol, tobacco or illicit drugs  . Current medications and supplements . Functional ability and status . Nutritional status . Physical activity . Advanced directives . List of other physicians . Hospitalizations, surgeries, and ER visits in previous 12  months . Vitals . Screenings to include cognitive, depression, and falls . Referrals and appointments  In addition, I have reviewed and discussed with patient certain preventive protocols, quality metrics, and best practice recommendations. A written personalized care plan for preventive services as well as general preventive health recommendations were provided to patient.     Willette Brace, LPN   [id number]   Nurse Notes: None

## 2020-04-04 NOTE — Patient Instructions (Addendum)
Pamela [name] , Thank you for taking time to come for your Medicare Wellness Visit. I appreciate your ongoing commitment to your health goals. Please review the following plan we discussed and let me know if I can assist you in the future.   Screening recommendations/referrals: Colonoscopy: Done 07/23/15 Mammogram: Done 07/20/19 Bone Density: Done 06/27/16 Recommended yearly ophthalmology/optometry visit for glaucoma screening and checkup Recommended yearly dental visit for hygiene and checkup  Vaccinations: Influenza vaccine: Up to date Pneumococcal vaccine: Up to date Tdap vaccine: Up to date Shingles vaccine: Done 04/30/18   Covid-19:Completed 2/5/ & 10/21/19   Advanced directives: Please bring a copy of your health care power of attorney and living will to the office at your convenience.  Conditions/risks identified: Lose a few pounds  Next appointment: Follow up in one year for your annual wellness visit    Preventive Care 65 Years and Older, Female Preventive care refers to lifestyle choices and visits with your health care provider that can promote health and wellness. What does preventive care include?  A yearly physical exam. This is also called an annual well check.  Dental exams once or twice a year.  Routine eye exams. Ask your health care provider how often you should have your eyes checked.  Personal lifestyle choices, including:  Daily care of your teeth and gums.  Regular physical activity.  Eating a healthy diet.  Avoiding tobacco and drug use.  Limiting alcohol use.  Practicing safe sex.  Taking low-dose aspirin every day.  Taking vitamin and mineral supplements as recommended by your health care provider. What happens during an annual well check? The services and screenings done by your health care provider during your annual well check will depend on your age, overall health, lifestyle risk factors, and family history of disease. Counseling  Your  health care provider may ask you questions about your:  Alcohol use.  Tobacco use.  Drug use.  Emotional well-being.  Home and relationship well-being.  Sexual activity.  Eating habits.  History of falls.  Memory and ability to understand (cognition).  Work and work Statistician.  Reproductive health. Screening  You may have the following tests or measurements:  Height, weight, and BMI.  Blood pressure.  Lipid and cholesterol levels. These may be checked every 5 years, or more frequently if you are over 67 years old.  Skin check.  Lung cancer screening. You may have this screening every year starting at age 79 if you have a 30-pack-year history of smoking and currently smoke or have quit within the past 15 years.  Fecal occult blood test (FOBT) of the stool. You may have this test every year starting at age 61.  Flexible sigmoidoscopy or colonoscopy. You may have a sigmoidoscopy every 5 years or a colonoscopy every 10 years starting at age 62.  Hepatitis C blood test.  Hepatitis B blood test.  Sexually transmitted disease (STD) testing.  Diabetes screening. This is done by checking your blood sugar (glucose) after you have not eaten for a while (fasting). You may have this done every 1-3 years.  Bone density scan. This is done to screen for osteoporosis. You may have this done starting at age 53.  Mammogram. This may be done every 1-2 years. Talk to your health care provider about how often you should have regular mammograms. Talk with your health care provider about your test results, treatment options, and if necessary, the need for more tests. Vaccines  Your health care provider may recommend  certain vaccines, such as:  Influenza vaccine. This is recommended every year.  Tetanus, diphtheria, and acellular pertussis (Tdap, Td) vaccine. You may need a Td booster every 10 years.  Zoster vaccine. You may need this after age 9.  Pneumococcal 13-valent  conjugate (PCV13) vaccine. One dose is recommended after age 81.  Pneumococcal polysaccharide (PPSV23) vaccine. One dose is recommended after age 45. Talk to your health care provider about which screenings and vaccines you need and how often you need them. This information is not intended to replace advice given to you by your health care provider. Make sure you discuss any questions you have with your health care provider. Document Released: 09/07/2015 Document Revised: 04/30/2016 Document Reviewed: 06/12/2015 Elsevier Interactive Patient Education  2017 [place] Prevention in the Home Falls can cause injuries. They can happen to people of all ages. There are many things you can do to make your home safe and to help prevent falls. What can I do on the outside of my home?  Regularly fix the edges of walkways and driveways and fix any cracks.  Remove anything that might make you trip as you walk through a door, such as a raised step or threshold.  Trim any bushes or trees on the path to your home.  Use bright outdoor lighting.  Clear any walking paths of anything that might make someone trip, such as rocks or tools.  Regularly check to see if handrails are loose or broken. Make sure that both sides of any steps have handrails.  Any raised decks and porches should have guardrails on the edges.  Have any leaves, snow, or ice cleared regularly.  Use sand or salt on walking paths during winter.  Clean up any spills in your garage right away. This includes oil or grease spills. What can I do in the bathroom?  Use night lights.  Install grab bars by the toilet and in the tub and shower. Do not use towel bars as grab bars.  Use non-skid mats or decals in the tub or shower.  If you need to sit down in the shower, use a plastic, non-slip stool.  Keep the floor dry. Clean up any water that spills on the floor as soon as it happens.  Remove soap buildup in the tub or  shower regularly.  Attach bath mats securely with double-sided non-slip rug tape.  Do not have throw rugs and other things on the floor that can make you trip. What can I do in the bedroom?  Use night lights.  Make sure that you have a light by your bed that is easy to reach.  Do not use any sheets or blankets that are too big for your bed. They should not hang down onto the floor.  Have a firm chair that has side arms. You can use this for support while you get dressed.  Do not have throw rugs and other things on the floor that can make you trip. What can I do in the kitchen?  Clean up any spills right away.  Avoid walking on wet floors.  Keep items that you use a lot in easy-to-reach places.  If you need to reach something above you, use a strong step stool that has a grab bar.  Keep electrical cords out of the way.  Do not use floor polish or wax that makes floors slippery. If you must use wax, use non-skid floor wax.  Do not have throw rugs and other  things on the floor that can make you trip. What can I do with my stairs?  Do not leave any items on the stairs.  Make sure that there are handrails on both sides of the stairs and use them. Fix handrails that are broken or loose. Make sure that handrails are as long as the stairways.  Check any carpeting to make sure that it is firmly attached to the stairs. Fix any carpet that is loose or worn.  Avoid having throw rugs at the top or bottom of the stairs. If you do have throw rugs, attach them to the floor with carpet tape.  Make sure that you have a light switch at the top of the stairs and the bottom of the stairs. If you do not have them, ask someone to add them for you. What else can I do to help prevent falls?  Wear shoes that:  Do not have high heels.  Have rubber bottoms.  Are comfortable and fit you well.  Are closed at the toe. Do not wear sandals.  If you use a stepladder:  Make sure that it is fully  opened. Do not climb a closed stepladder.  Make sure that both sides of the stepladder are locked into place.  Ask someone to hold it for you, if possible.  Clearly mark and make sure that you can see:  Any grab bars or handrails.  First and last steps.  Where the edge of each step is.  Use tools that help you move around (mobility aids) if they are needed. These include:  Canes.  Walkers.  Scooters.  Crutches.  Turn on the lights when you go into a dark area. Replace any light bulbs as soon as they burn out.  Set up your furniture so you have a clear path. Avoid moving your furniture around.  If any of your floors are uneven, fix them.  If there are any pets around you, be aware of where they are.  Review your medicines with your doctor. Some medicines can make you feel dizzy. This can increase your chance of falling. Ask your doctor what other things that you can do to help prevent falls. This information is not intended to replace advice given to you by your health care provider. Make sure you discuss any questions you have with your health care provider. Document Released: 06/07/2009 Document Revised: 01/17/2016 Document Reviewed: 09/15/2014 Elsevier Interactive Patient Education  2017 [employer].

## 2020-06-07 ENCOUNTER — Other Ambulatory Visit: Payer: Self-pay | Admitting: Internal Medicine

## 2020-06-07 DIAGNOSIS — Z1231 Encounter for screening mammogram for malignant neoplasm of breast: Secondary | ICD-10-CM

## 2020-07-11 ENCOUNTER — Ambulatory Visit: Payer: Medicare PPO

## 2020-07-11 ENCOUNTER — Other Ambulatory Visit: Payer: Self-pay | Admitting: Internal Medicine

## 2020-07-25 NOTE — Progress Notes (Signed)
Chief Complaint  Patient presents with  . Annual Exam    HPI: Pamela [name] 69 y.o. comes in today for Preventive Medicare exam/ wellness visit .Since last visit. dong well .  Mood better comes and goes .wants to remain on  Sertraline now on 100 mg per day  IBS  ocass flair manages .  Needs refill premarin cream .  Manages her OA and does yoga other    Health Maintenance  Topic Date Due  . MAMMOGRAM  07/19/2021  . COLONOSCOPY  07/22/2025  . TETANUS/TDAP  01/13/2027  . INFLUENZA VACCINE  Completed  . DEXA SCAN  Completed  . COVID-19 Vaccine  Completed  . Hepatitis C Screening  Completed  . PNA vac Low Risk Adult  Completed   Health Maintenance Review LIFESTYLE:  Exercise:   Good  Daily silver  Sneakers  Tobacco/ETS: no Alcohol:   ocass1 per day  Sugar beverages:  rare Sleep:  7-8 hours  Drug use: no HH: 1  1 dog  Widowed    Hearing: fine   Vision:  No limitations at present . Last eye check UTD glasses early cataract  Safety:  Has smoke detector and wears seat belts.   No excess sun exposure. Sees dentist regularly.  Falls:  no  Memory: Felt to be good  , no concern from her or her family.  Depression: No anhedonia unusual crying or depressive symptoms  Nutrition: Eats well balanced diet; adequate calcium and vitamin D. No swallowing chewing problems.  Injury: no major injuries in the last six months.  Other healthcare providers:  Reviewed today .   Preventive parameters: up-to-date  Reviewed  X  2 shingrix?  ADLS:   There are no problems or need for assistance  driving, feeding, obtaining food, dressing, toileting and bathing, managing money using phone. She is independent.   ROS:  GEN/ HEENT: No fever, significant weight changes sweats headaches vision problems hearing changes, CV/ PULM; No chest pain shortness of breath cough, syncope,edema  change in exercise tolerance. GI /GU: No adominal pain, vomiting, change in bowel habits. No blood in  the stool. No significant GU symptoms. SKIN/HEME: ,no acute skin rashes suspicious lesions or bleeding. No lymphadenopathy, nodules, masses.  NEURO/ PSYCH:  No neurologic signs such as weakness numbness. No depression anxiety. IMM/ Allergy: No unusual infections.  Allergy .   REST of 12 system review negative except as per HPI   Past Medical History:  Diagnosis Date  . Arthritis KNEES, HANDS, SHOULDERS  . Depression   . Hx of Clostridium difficile infection   . Hyperlipidemia, mixed   . Impingement syndrome of right shoulder   . Intestinal bacterial overgrowth 06/20/2013   Under rx per dr Collene Mares   . Skin cancer    Tonia Brooms   . Vaginitis, atrophic    recurrent    Family History  Problem Relation Age of Onset  . Arthritis Sister         CPPD autoinmmune  . Hyperlipidemia Other        family hx  . Hypertension Other        family hx  . Sudden death Other        family hx  . Heart disease Other        family hx  . Multiple myeloma Father        deceased  . Stroke Mother 6    Social History   Socioeconomic History  . Marital status: Widowed  Spouse name: Not on file  . Number of children: Not on file  . Years of education: Not on file  . Highest education level: Not on file  Occupational History  . Occupation: retired    Fish farm manager: RETIRED  Tobacco Use  . Smoking status: Never Smoker  . Smokeless tobacco: Never Used  Substance and Sexual Activity  . Alcohol use: Yes    Comment: OCCASIONAL  . Drug use: No  . Sexual activity: Not on file  Other Topics Concern  . 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  Has smoke detector and wears seat belts.  No firearms. No excess sun exposure. Sees dentist regularly .   Dexa nl 2003-10-26 and 26-Oct-2006   Husband inhpsicde care for  Throat  Cancer  2017-10-25 passed away 07/27/18   Social Determinants of  Health   Financial Resource Strain: Low Risk   . Difficulty of Paying Living Expenses: Not hard at all  Food Insecurity: No Food Insecurity  . Worried About Charity fundraiser in the Last Year: Never true  . Ran Out of Food in the Last Year: Never true  Transportation Needs: No Transportation Needs  . Lack of Transportation (Medical): No  . Lack of Transportation (Non-Medical): No  Physical Activity: Sufficiently Active  . Days of Exercise per Week: 7 days  . Minutes of Exercise per Session: 30 min  Stress: No Stress Concern Present  . Feeling of Stress : Not at all  Social Connections: Moderately Isolated  . Frequency of Communication with Friends and Family: More than three times a week  . Frequency of Social Gatherings with Friends and Family: Three times a week  . Attends Religious Services: Never  . Active Member of Clubs or Organizations: Yes  . Attends Archivist Meetings: More than 4 times per year  . Marital Status: Widowed    Outpatient Encounter Medications as of 07/27/2020  Medication Sig  . conjugated estrogens (PREMARIN) vaginal cream USE VAGINALLY AS INSTRUCTED  . sertraline (ZOLOFT) 100 MG tablet TAKE 1 TABLET BY MOUTH DAILY  . VITAMIN D PO Take by mouth.  . [DISCONTINUED] conjugated estrogens (PREMARIN) vaginal cream USE VAGINALLY AS INSTRUCTED   No facility-administered encounter medications on file as of 07/27/2020.    EXAM:  BP 124/70   Pulse 76   Temp 98.8 F (37.1 C) (Oral)   Ht '5\' 4"'  (1.626 m)   Wt 151 lb (68.5 kg)   SpO2 99%   BMI 25.92 kg/m   Body mass index is 25.92 kg/m.  Physical Exam: Vital signs reviewed WYO:VZCH is a well-developed well-nourished alert cooperative   who appears stated age in no acute distress.  HEENT: normocephalic atraumatic , Eyes: PERRL EOM's full, conjunctiva clear, Nares: paten,t no deformity discharge or tenderness., Ears: no deformity EAC's clear TMs with normal landmarks. Mouth:maksed NECK: supple  without masses, thyromegaly or bruits. CHEST/PULM:  Clear to auscultation and percussion breath sounds equal no wheeze , rales or rhonchi. No chest wall deformities or tenderness. Breast: normal by inspection . No dimpling, discharge, masses, tenderness or discharge . CV: PMI is nondisplaced, S1 S2 no gallops, murmurs, rubs. Peripheral pulses are full without delay.No JVD .  ABDOMEN: Bowel sounds normal nontender  No guard or rebound, no hepato  splenomegal no CVA tenderness.   Extremtities:  No clubbing cyanosis or edema, no acute joint swelling or redness no focal atrophy NEURO:  Oriented x3, cranial nerves 3-12 appear to be intact, no obvious focal weakness,gait within normal limits no abnormal reflexes or asymmetrical SKIN: No acute rashes normal turgor, color, no bruising or petechiae. PSYCH: Oriented, good eye contact, no obvious depression anxiety, cognition and judgment appear normal. LN: no cervical axillary inguinal adenopathy No noted deficits in memory, attention, and speech.   Lab Results  Component Value Date   WBC 5.0 07/07/2019   HGB 12.6 07/07/2019   HCT 37.4 07/07/2019   PLT 234.0 07/07/2019   GLUCOSE 83 07/07/2019   CHOL 277 (H) 07/07/2019   TRIG 133.0 07/07/2019   HDL 77.60 07/07/2019   LDLDIRECT 173.5 06/13/2013   LDLCALC 172 (H) 07/07/2019   ALT 17 07/07/2019   AST 19 07/07/2019   NA 139 07/07/2019   K 4.6 07/07/2019   CL 104 07/07/2019   CREATININE 0.92 07/07/2019   BUN 20 07/07/2019   CO2 27 07/07/2019   TSH 2.57 07/07/2019    ASSESSMENT AND PLAN:  Discussed the following assessment and plan:  Visit for preventive health examination  Hyperlipidemia, unspecified hyperlipidemia type - Plan: CBC with Differential/Platelet, BASIC METABOLIC PANEL WITH GFR, Lipid panel, Hepatic function panel, TSH, TSH, Hepatic function panel, Lipid panel, BASIC METABOLIC PANEL WITH GFR, CBC with Differential/Platelet  Medication management - mood stable doeing well at  this time  on 100 sertraline - Plan: CBC with Differential/Platelet, BASIC METABOLIC PANEL WITH GFR, Lipid panel, Hepatic function panel, TSH, TSH, Hepatic function panel, Lipid panel, BASIC METABOLIC PANEL WITH GFR, CBC with Differential/Platelet  Primary osteoarthritis, unspecified site - Plan: CBC with Differential/Platelet, BASIC METABOLIC PANEL WITH GFR, Lipid panel, Hepatic function panel, TSH, TSH, Hepatic function panel, Lipid panel, BASIC METABOLIC PANEL WITH GFR, CBC with Differential/Platelet To continue  Healthy lifes style  Has been able to   Lose weight   In past year  Because of this Patient Care Team: Burnis Medin, MD as PCP - General Paralee Cancel, MD (Orthopedic Surgery) Juanita Craver, MD as Attending Physician (Gastroenterology) Paralee Cancel, MD as Consulting Physician (Orthopedic Surgery)  Patient Instructions  Continue lifestyle intervention healthy eating and exercise .  Get second shingrix at your pharmacy when convenient .     Health Maintenance, Female Adopting a healthy lifestyle and getting preventive care are important in promoting health and wellness. Ask your health care provider about:  The right schedule for you to have regular tests and exams.  Things you can do on your own to prevent diseases and keep yourself healthy. What should I know about diet, weight, and exercise? Eat a healthy diet   Eat a diet that includes plenty of vegetables, fruits, low-fat dairy products, and lean protein.  Do not eat a lot of foods that are high in solid fats, added sugars, or sodium. Maintain a healthy weight Body mass index (BMI) is used to identify weight problems. It estimates body fat based on height and weight. Your health care provider can help determine your BMI and help you achieve or maintain a healthy weight. Get regular exercise Get regular exercise. This is one of the most important things you can do for your health. Most adults should:  Exercise for  at least 150 minutes each week. The exercise should increase your heart rate and make you sweat (moderate-intensity exercise).  Do strengthening exercises at least twice a week.  This is in addition to the moderate-intensity exercise.  Spend less time sitting. Even light physical activity can be beneficial. Watch cholesterol and blood lipids Have your blood tested for lipids and cholesterol at 71 years of age, then have this test every 5 years. Have your cholesterol levels checked more often if:  Your lipid or cholesterol levels are high.  You are older than 71 years of age.  You are at high risk for heart disease. What should I know about cancer screening? Depending on your health history and family history, you may need to have cancer screening at various ages. This may include screening for:  Breast cancer.  Cervical cancer.  Colorectal cancer.  Skin cancer.  Lung cancer. What should I know about heart disease, diabetes, and high blood pressure? Blood pressure and heart disease  High blood pressure causes heart disease and increases the risk of stroke. This is more likely to develop in people who have high blood pressure readings, are of African descent, or are overweight.  Have your blood pressure checked: ? Every 3-5 years if you are 55-20 years of age. ? Every year if you are 13 years old or older. Diabetes Have regular diabetes screenings. This checks your fasting blood sugar level. Have the screening done:  Once every three years after age 6 if you are at a normal weight and have a low risk for diabetes.  More often and at a younger age if you are overweight or have a high risk for diabetes. What should I know about preventing infection? Hepatitis B If you have a higher risk for hepatitis B, you should be screened for this virus. Talk with your health care provider to find out if you are at risk for hepatitis B infection. Hepatitis C Testing is recommended  for:  Everyone born from [birth year] through 1967.  Anyone with known risk factors for hepatitis C. Sexually transmitted infections (STIs)  Get screened for STIs, including gonorrhea and chlamydia, if: ? You are sexually active and are younger than 71 years of age. ? You are older than 71 years of age and your health care provider tells you that you are at risk for this type of infection. ? Your sexual activity has changed since you were last screened, and you are at increased risk for chlamydia or gonorrhea. Ask your health care provider if you are at risk.  Ask your health care provider about whether you are at high risk for HIV. Your health care provider may recommend a prescription medicine to help prevent HIV infection. If you choose to take medicine to prevent HIV, you should first get tested for HIV. You should then be tested every 3 months for as long as you are taking the medicine. Pregnancy  If you are about to stop having your period (premenopausal) and you may become pregnant, seek counseling before you get pregnant.  Take 400 to 800 micrograms (mcg) of folic acid every day if you become pregnant.  Ask for birth control (contraception) if you want to prevent pregnancy. Osteoporosis and menopause Osteoporosis is a disease in which the bones lose minerals and strength with aging. This can result in bone fractures. If you are 57 years old or older, or if you are at risk for osteoporosis and fractures, ask your health care provider if you should:  Be screened for bone loss.  Take a calcium or vitamin D supplement to lower your risk of fractures.  Be given hormone replacement therapy (HRT) to  treat symptoms of menopause. Follow these instructions at home: Lifestyle  Do not use any products that contain nicotine or tobacco, such as cigarettes, e-cigarettes, and chewing tobacco. If you need help quitting, ask your health care provider.  Do not use street drugs.  Do not share  needles.  Ask your health care provider for help if you need support or information about quitting drugs. Alcohol use  Do not drink alcohol if: ? Your health care provider tells you not to drink. ? You are pregnant, may be pregnant, or are planning to become pregnant.  If you drink alcohol: ? Limit how much you use to 0-1 drink a day. ? Limit intake if you are breastfeeding.  Be aware of how much alcohol is in your drink. In the [place] one drink equals one 12 oz bottle of beer (355 mL), one 5 oz glass of wine (148 mL), or one 1 oz glass of hard liquor (44 mL). General instructions  Schedule regular health, dental, and eye exams.  Stay current with your vaccines.  Tell your health care provider if: ? You often feel depressed. ? You have ever been abused or do not feel safe at home. Summary  Adopting a healthy lifestyle and getting preventive care are important in promoting health and wellness.  Follow your health care provider's instructions about healthy diet, exercising, and getting tested or screened for diseases.  Follow your health care provider's instructions on monitoring your cholesterol and blood pressure. This information is not intended to replace advice given to you by your health care provider. Make sure you discuss any questions you have with your health care provider. Document Revised: 08/04/2018 Document Reviewed: 08/04/2018 Elsevier Patient Education  2020 Leonard Brenan [place] M.D.

## 2020-07-26 ENCOUNTER — Ambulatory Visit
Admission: RE | Admit: 2020-07-26 | Discharge: 2020-07-26 | Disposition: A | Discharge status: Home / Self Care | Payer: Medicare PPO | Source: Ambulatory Visit | Attending: Internal Medicine | Admitting: Internal Medicine

## 2020-07-26 ENCOUNTER — Other Ambulatory Visit: Payer: Self-pay

## 2020-07-26 DIAGNOSIS — Z1231 Encounter for screening mammogram for malignant neoplasm of breast: Secondary | ICD-10-CM

## 2020-07-27 ENCOUNTER — Encounter: Payer: Self-pay | Admitting: Internal Medicine

## 2020-07-27 ENCOUNTER — Ambulatory Visit (INDEPENDENT_AMBULATORY_CARE_PROVIDER_SITE_OTHER): Payer: Medicare PPO | Admitting: Internal Medicine

## 2020-07-27 VITALS — BP 124/70 | HR 76 | Temp 98.8°F | Ht 64.0 in | Wt 151.0 lb

## 2020-07-27 DIAGNOSIS — M1991 Primary osteoarthritis, unspecified site: Secondary | ICD-10-CM

## 2020-07-27 DIAGNOSIS — Z79899 Other long term (current) drug therapy: Secondary | ICD-10-CM

## 2020-07-27 DIAGNOSIS — E785 Hyperlipidemia, unspecified: Secondary | ICD-10-CM | POA: Diagnosis not present

## 2020-07-27 DIAGNOSIS — Z0000 Encounter for general adult medical examination without abnormal findings: Secondary | ICD-10-CM | POA: Diagnosis not present

## 2020-07-27 MED ORDER — PREMARIN 0.625 MG/GM VA CREA
TOPICAL_CREAM | VAGINAL | 5 refills | Status: DC
Start: 2020-07-27 — End: 2021-07-31

## 2020-07-27 NOTE — Patient Instructions (Signed)
Continue lifestyle intervention healthy eating and exercise .  Get second shingrix at your pharmacy when convenient .     Health Maintenance, Female Adopting a healthy lifestyle and getting preventive care are important in promoting health and wellness. Ask your health care provider about:  The right schedule for you to have regular tests and exams.  Things you can do on your own to prevent diseases and keep yourself healthy. What should I know about diet, weight, and exercise? Eat a healthy diet   Eat a diet that includes plenty of vegetables, fruits, low-fat dairy products, and lean protein.  Do not eat a lot of foods that are high in solid fats, added sugars, or sodium. Maintain a healthy weight Body mass index (BMI) is used to identify weight problems. It estimates body fat based on height and weight. Your health care provider can help determine your BMI and help you achieve or maintain a healthy weight. Get regular exercise Get regular exercise. This is one of the most important things you can do for your health. Most adults should:  Exercise for at least 150 minutes each week. The exercise should increase your heart rate and make you sweat (moderate-intensity exercise).  Do strengthening exercises at least twice a week. This is in addition to the moderate-intensity exercise.  Spend less time sitting. Even light physical activity can be beneficial. Watch cholesterol and blood lipids Have your blood tested for lipids and cholesterol at 71 years of age, then have this test every 5 years. Have your cholesterol levels checked more often if:  Your lipid or cholesterol levels are high.  You are older than 71 years of age.  You are at high risk for heart disease. What should I know about cancer screening? Depending on your health history and family history, you may need to have cancer screening at various ages. This may include screening for:  Breast cancer.  Cervical  cancer.  Colorectal cancer.  Skin cancer.  Lung cancer. What should I know about heart disease, diabetes, and high blood pressure? Blood pressure and heart disease  High blood pressure causes heart disease and increases the risk of stroke. This is more likely to develop in people who have high blood pressure readings, are of African descent, or are overweight.  Have your blood pressure checked: ? Every 3-5 years if you are 17-42 years of age. ? Every year if you are 82 years old or older. Diabetes Have regular diabetes screenings. This checks your fasting blood sugar level. Have the screening done:  Once every three years after age 49 if you are at a normal weight and have a low risk for diabetes.  More often and at a younger age if you are overweight or have a high risk for diabetes. What should I know about preventing infection? Hepatitis B If you have a higher risk for hepatitis B, you should be screened for this virus. Talk with your health care provider to find out if you are at risk for hepatitis B infection. Hepatitis C Testing is recommended for:  Everyone born from [birth year] through 1967.  Anyone with known risk factors for hepatitis C. Sexually transmitted infections (STIs)  Get screened for STIs, including gonorrhea and chlamydia, if: ? You are sexually active and are younger than 71 years of age. ? You are older than 71 years of age and your health care provider tells you that you are at risk for this type of infection. ? Your sexual activity has  changed since you were last screened, and you are at increased risk for chlamydia or gonorrhea. Ask your health care provider if you are at risk.  Ask your health care provider about whether you are at high risk for HIV. Your health care provider may recommend a prescription medicine to help prevent HIV infection. If you choose to take medicine to prevent HIV, you should first get tested for HIV. You should then be tested every 3  months for as long as you are taking the medicine. Pregnancy  If you are about to stop having your period (premenopausal) and you may become pregnant, seek counseling before you get pregnant.  Take 400 to 800 micrograms (mcg) of folic acid every day if you become pregnant.  Ask for birth control (contraception) if you want to prevent pregnancy. Osteoporosis and menopause Osteoporosis is a disease in which the bones lose minerals and strength with aging. This can result in bone fractures. If you are 35 years old or older, or if you are at risk for osteoporosis and fractures, ask your health care provider if you should:  Be screened for bone loss.  Take a calcium or vitamin D supplement to lower your risk of fractures.  Be given hormone replacement therapy (HRT) to treat symptoms of menopause. Follow these instructions at home: Lifestyle  Do not use any products that contain nicotine or tobacco, such as cigarettes, e-cigarettes, and chewing tobacco. If you need help quitting, ask your health care provider.  Do not use street drugs.  Do not share needles.  Ask your health care provider for help if you need support or information about quitting drugs. Alcohol use  Do not drink alcohol if: ? Your health care provider tells you not to drink. ? You are pregnant, may be pregnant, or are planning to become pregnant.  If you drink alcohol: ? Limit how much you use to 0-1 drink a day. ? Limit intake if you are breastfeeding.  Be aware of how much alcohol is in your drink. In the [place] one drink equals one 12 oz bottle of beer (355 mL), one 5 oz glass of wine (148 mL), or one 1 oz glass of hard liquor (44 mL). General instructions  Schedule regular health, dental, and eye exams.  Stay current with your vaccines.  Tell your health care provider if: ? You often feel depressed. ? You have ever been abused or do not feel safe at home. Summary  Adopting a healthy lifestyle and getting  preventive care are important in promoting health and wellness.  Follow your health care provider's instructions about healthy diet, exercising, and getting tested or screened for diseases.  Follow your health care provider's instructions on monitoring your cholesterol and blood pressure. This information is not intended to replace advice given to you by your health care provider. Make sure you discuss any questions you have with your health care provider. Document Revised: 08/04/2018 Document Reviewed: 08/04/2018 Elsevier Patient Education  2020 [employer].

## 2020-07-28 LAB — HEPATIC FUNCTION PANEL
AG Ratio: 2.5 (calc) (ref 1.0–2.5)
ALT: 11 U/L (ref 6–29)
AST: 15 U/L (ref 10–35)
Albumin: 4.5 g/dL (ref 3.6–5.1)
Alkaline phosphatase (APISO): 67 U/L (ref 37–153)
Bilirubin, Direct: 0.1 mg/dL (ref 0.0–0.2)
Globulin: 1.8 g/dL (calc) — ABNORMAL LOW (ref 1.9–3.7)
Indirect Bilirubin: 0.5 mg/dL (calc) (ref 0.2–1.2)
Total Bilirubin: 0.6 mg/dL (ref 0.2–1.2)
Total Protein: 6.3 g/dL (ref 6.1–8.1)

## 2020-07-28 LAB — CBC WITH DIFFERENTIAL/PLATELET
Absolute Monocytes: 508 cells/uL (ref 200–950)
Basophils Absolute: 39 cells/uL (ref 0–200)
Basophils Relative: 0.5 %
Eosinophils Absolute: 62 cells/uL (ref 15–500)
Eosinophils Relative: 0.8 %
HCT: 37.8 % (ref 35.0–45.0)
Hemoglobin: 12.7 g/dL (ref 11.7–15.5)
Lymphs Abs: 1494 cells/uL (ref 850–3900)
MCH: 27.9 pg (ref 27.0–33.0)
MCHC: 33.6 g/dL (ref 32.0–36.0)
MCV: 83.1 fL (ref 80.0–100.0)
MPV: 10.6 fL (ref 7.5–12.5)
Monocytes Relative: 6.6 %
Neutro Abs: 5598 cells/uL (ref 1500–7800)
Neutrophils Relative %: 72.7 %
Platelets: 219 10*3/uL (ref 140–400)
RBC: 4.55 10*6/uL (ref 3.80–5.10)
RDW: 13.5 % (ref 11.0–15.0)
Total Lymphocyte: 19.4 %
WBC: 7.7 10*3/uL (ref 3.8–10.8)

## 2020-07-28 LAB — TSH: TSH: 1.4 mIU/L (ref 0.40–4.50)

## 2020-07-28 LAB — BASIC METABOLIC PANEL WITH GFR
BUN: 13 mg/dL (ref 7–25)
CO2: 30 mmol/L (ref 20–32)
Calcium: 9.3 mg/dL (ref 8.6–10.4)
Chloride: 103 mmol/L (ref 98–110)
Creat: 0.76 mg/dL (ref 0.60–0.93)
GFR, Est African American: 91 mL/min/{1.73_m2} (ref >=60)
GFR, Est Non African American: 79 mL/min/{1.73_m2} (ref >=60)
Glucose, Bld: 91 mg/dL (ref 65–99)
Potassium: 4.4 mmol/L (ref 3.5–5.3)
Sodium: 140 mmol/L (ref 135–146)

## 2020-07-28 LAB — LIPID PANEL
Cholesterol: 245 mg/dL — ABNORMAL HIGH (ref <200)
HDL: 60 mg/dL (ref >=50)
LDL Cholesterol (Calc): 158 mg/dL (calc) — ABNORMAL HIGH
Non-HDL Cholesterol (Calc): 185 mg/dL (calc) — ABNORMAL HIGH (ref <130)
Total CHOL/HDL Ratio: 4.1 (calc) (ref <5.0)
Triglycerides: 144 mg/dL (ref <150)

## 2020-08-13 NOTE — Progress Notes (Signed)
Lipids elevated but better than in past.  Other out orf range may be clinically insignificant  Continue lifestyle intervention healthy eating and exercise .

## 2021-04-10 ENCOUNTER — Other Ambulatory Visit: Payer: Self-pay

## 2021-04-10 ENCOUNTER — Ambulatory Visit (INDEPENDENT_AMBULATORY_CARE_PROVIDER_SITE_OTHER): Payer: Medicare PPO

## 2021-04-10 DIAGNOSIS — Z0000 Encounter for general adult medical examination without abnormal findings: Secondary | ICD-10-CM | POA: Diagnosis not present

## 2021-04-10 NOTE — Patient Instructions (Signed)
Pamela [name] , Thank you for taking time to come for your Medicare Wellness Visit. I appreciate your ongoing commitment to your health goals. Please review the following plan we discussed and let me know if I can assist you in the future.   Screening recommendations/referrals: Colonoscopy: Done 06/22/15 repeat every 10 years due 06/21/25 Mammogram: Done 07/26/20 repeat every year Bone Density: Done 06/27/16 repeat every 2 years  Recommended yearly ophthalmology/optometry visit for glaucoma screening and checkup Recommended yearly dental visit for hygiene and checkup  Vaccinations: Influenza vaccine: Due Pneumococcal vaccine: Completed  Tdap vaccine: Done 01/12/17 repeat every 10 years due 01/13/27 Shingles vaccine: Done 05/10/18   Covid-19:Completed 2/5, 2/26, & 06/07/20  Advanced directives: Please bring a copy of your health care power of attorney and living will to the office at your convenience.  Conditions/risks identified: Lose 7 lbs   Next appointment: Follow up in one year for your annual wellness visit    Preventive Care 72 Years and Older, Female Preventive care refers to lifestyle choices and visits with your health care provider that can promote health and wellness. What does preventive care include? A yearly physical exam. This is also called an annual well check. Dental exams once or twice a year. Routine eye exams. Ask your health care provider how often you should have your eyes checked. Personal lifestyle choices, including: Daily care of your teeth and gums. Regular physical activity. Eating a healthy diet. Avoiding tobacco and drug use. Limiting alcohol use. Practicing safe sex. Taking low-dose aspirin every day. Taking vitamin and mineral supplements as recommended by your health care provider. What happens during an annual well check? The services and screenings done by your health care provider during your annual well check will depend on your age, overall  health, lifestyle risk factors, and family history of disease. Counseling  Your health care provider may ask you questions about your: Alcohol use. Tobacco use. Drug use. Emotional well-being. Home and relationship well-being. Sexual activity. Eating habits. History of falls. Memory and ability to understand (cognition). Work and work Statistician. Reproductive health. Screening  You may have the following tests or measurements: Height, weight, and BMI. Blood pressure. Lipid and cholesterol levels. These may be checked every 5 years, or more frequently if you are over 43 years old. Skin check. Lung cancer screening. You may have this screening every year starting at age 63 if you have a 30-pack-year history of smoking and currently smoke or have quit within the past 15 years. Fecal occult blood test (FOBT) of the stool. You may have this test every year starting at age 38. Flexible sigmoidoscopy or colonoscopy. You may have a sigmoidoscopy every 5 years or a colonoscopy every 10 years starting at age 40. Hepatitis C blood test. Hepatitis B blood test. Sexually transmitted disease (STD) testing. Diabetes screening. This is done by checking your blood sugar (glucose) after you have not eaten for a while (fasting). You may have this done every 1-3 years. Bone density scan. This is done to screen for osteoporosis. You may have this done starting at age 55. Mammogram. This may be done every 1-2 years. Talk to your health care provider about how often you should have regular mammograms. Talk with your health care provider about your test results, treatment options, and if necessary, the need for more tests. Vaccines  Your health care provider may recommend certain vaccines, such as: Influenza vaccine. This is recommended every year. Tetanus, diphtheria, and acellular pertussis (Tdap, Td) vaccine. You  may need a Td booster every 10 years. Zoster vaccine. You may need this after age  30. Pneumococcal 13-valent conjugate (PCV13) vaccine. One dose is recommended after age 85. Pneumococcal polysaccharide (PPSV23) vaccine. One dose is recommended after age 79. Talk to your health care provider about which screenings and vaccines you need and how often you need them. This information is not intended to replace advice given to you by your health care provider. Make sure you discuss any questions you have with your health care provider. Document Released: 09/07/2015 Document Revised: 04/30/2016 Document Reviewed: 06/12/2015 Elsevier Interactive Patient Education  2017 [place] Prevention in the Home Falls can cause injuries. They can happen to people of all ages. There are many things you can do to make your home safe and to help prevent falls. What can I do on the outside of my home? Regularly fix the edges of walkways and driveways and fix any cracks. Remove anything that might make you trip as you walk through a door, such as a raised step or threshold. Trim any bushes or trees on the path to your home. Use bright outdoor lighting. Clear any walking paths of anything that might make someone trip, such as rocks or tools. Regularly check to see if handrails are loose or broken. Make sure that both sides of any steps have handrails. Any raised decks and porches should have guardrails on the edges. Have any leaves, snow, or ice cleared regularly. Use sand or salt on walking paths during winter. Clean up any spills in your garage right away. This includes oil or grease spills. What can I do in the bathroom? Use night lights. Install grab bars by the toilet and in the tub and shower. Do not use towel bars as grab bars. Use non-skid mats or decals in the tub or shower. If you need to sit down in the shower, use a plastic, non-slip stool. Keep the floor dry. Clean up any water that spills on the floor as soon as it happens. Remove soap buildup in the tub or shower  regularly. Attach bath mats securely with double-sided non-slip rug tape. Do not have throw rugs and other things on the floor that can make you trip. What can I do in the bedroom? Use night lights. Make sure that you have a light by your bed that is easy to reach. Do not use any sheets or blankets that are too big for your bed. They should not hang down onto the floor. Have a firm chair that has side arms. You can use this for support while you get dressed. Do not have throw rugs and other things on the floor that can make you trip. What can I do in the kitchen? Clean up any spills right away. Avoid walking on wet floors. Keep items that you use a lot in easy-to-reach places. If you need to reach something above you, use a strong step stool that has a grab bar. Keep electrical cords out of the way. Do not use floor polish or wax that makes floors slippery. If you must use wax, use non-skid floor wax. Do not have throw rugs and other things on the floor that can make you trip. What can I do with my stairs? Do not leave any items on the stairs. Make sure that there are handrails on both sides of the stairs and use them. Fix handrails that are broken or loose. Make sure that handrails are as long as the  stairways. Check any carpeting to make sure that it is firmly attached to the stairs. Fix any carpet that is loose or worn. Avoid having throw rugs at the top or bottom of the stairs. If you do have throw rugs, attach them to the floor with carpet tape. Make sure that you have a light switch at the top of the stairs and the bottom of the stairs. If you do not have them, ask someone to add them for you. What else can I do to help prevent falls? Wear shoes that: Do not have high heels. Have rubber bottoms. Are comfortable and fit you well. Are closed at the toe. Do not wear sandals. If you use a stepladder: Make sure that it is fully opened. Do not climb a closed stepladder. Make sure that  both sides of the stepladder are locked into place. Ask someone to hold it for you, if possible. Clearly mark and make sure that you can see: Any grab bars or handrails. First and last steps. Where the edge of each step is. Use tools that help you move around (mobility aids) if they are needed. These include: Canes. Walkers. Scooters. Crutches. Turn on the lights when you go into a dark area. Replace any light bulbs as soon as they burn out. Set up your furniture so you have a clear path. Avoid moving your furniture around. If any of your floors are uneven, fix them. If there are any pets around you, be aware of where they are. Review your medicines with your doctor. Some medicines can make you feel dizzy. This can increase your chance of falling. Ask your doctor what other things that you can do to help prevent falls. This information is not intended to replace advice given to you by your health care provider. Make sure you discuss any questions you have with your health care provider. Document Released: 06/07/2009 Document Revised: 01/17/2016 Document Reviewed: 09/15/2014 Elsevier Interactive Patient Education  2017 [employer].

## 2021-04-10 NOTE — Progress Notes (Addendum)
Virtual Visit via Telephone Note  I connected with  Pamela [name] on 04/10/21 at 11:00 AM EDT by telephone and verified that I am speaking with the correct person using two identifiers.  Medicare Annual Wellness visit completed telephonically due to Covid-19 pandemic.   Persons participating in this call: This Health Coach and this patient.   Location: Patient: Home Provider: Office   I discussed the limitations, risks, security and privacy concerns of performing an evaluation and management service by telephone and the availability of in person appointments. The patient expressed understanding and agreed to proceed.  Unable to perform video visit due to video visit attempted and failed and/or patient does not have video capability.   Some vital signs may be absent or patient reported.   Willette Brace, LPN   Subjective:   Pamela [name] is a 70 y.o. female who presents for Medicare Annual (Subsequent) preventive examination.  Review of Systems     Cardiac Risk Factors include: advanced age (>53mn, >>22women);dyslipidemia     Objective:    Today's Vitals   04/10/21 1027  PainSc: 7    There is no height or weight on file to calculate BMI.  Advanced Directives 04/10/2021 04/04/2020 12/04/2017 05/20/2017 08/06/2012  Does Patient Have a Medical Advance Directive? Yes Yes Yes Yes Patient would like information  Type of Advance Directive Living will;Healthcare Power of [place] will - - -  Copy of [place] Chart? No - copy requested No - copy requested - - -  Would patient like information on creating a medical advance directive? - - - - Advance directive packet given    Current Medications (verified) Outpatient Encounter Medications as of 04/10/2021  Medication Sig   conjugated estrogens (PREMARIN) vaginal cream USE VAGINALLY AS INSTRUCTED   sertraline (ZOLOFT) 100 MG tablet TAKE 1 TABLET BY MOUTH DAILY    VITAMIN D PO Take by mouth.   No facility-administered encounter medications on file as of 04/10/2021.    Allergies (verified) Simvastatin and Atorvastatin   History: Past Medical History:  Diagnosis Date   Arthritis KNEES, HANDS, SHOULDERS   Depression    Hx of Clostridium difficile infection    Hyperlipidemia, mixed    Impingement syndrome of right shoulder    Intestinal bacterial overgrowth 06/20/2013   Under rx per dr MCollene Mares   Skin cancer    GTonia Brooms   Vaginitis, atrophic    recurrent   Past Surgical History:  Procedure Laterality Date   ABDOMINAL HYSTERECTOMY  1992   W/ BILATERAL SALPINGO-OOPHORECTOMY   CARPAL TUNNEL RELEASE     SHOULDER ARTHROSCOPY WITH ROTATOR CUFF REPAIR  08/06/2012   Procedure: SHOULDER ARTHROSCOPY WITH ROTATOR CUFF REPAIR;  Surgeon: JMagnus Sinning MD;  [facility]  Service: Orthopedics;  Laterality: Right;   SHOULDER ARTHROSCOPY WITH SUBACROMIAL DECOMPRESSION  08/06/2012   Procedure: SHOULDER ARTHROSCOPY WITH SUBACROMIAL DECOMPRESSION;  Surgeon: JMagnus Sinning MD;  [place] [facility]  Service: Orthopedics;  Laterality: Right;  debridement of labral   TONSILLECTOMY  AS CHILD   Family History  Problem Relation Age of Onset   Arthritis Sister         CPPD autoinmmune   Hyperlipidemia Other        family hx   Hypertension Other        family hx   Sudden death Other        family hx  Heart disease Other        family hx   Multiple myeloma Father        deceased   Stroke Mother 27   Social History   Socioeconomic History   Marital status: Widowed    Spouse name: Not on file   Number of children: Not on file   Years of education: Not on file   Highest education level: Not on file  Occupational History   Occupation: retired    Fish farm manager: RETIRED  Tobacco Use   Smoking status: Never   Smokeless tobacco: Never  Substance and Sexual Activity   Alcohol use: Yes    Comment: OCCASIONAL    Drug use: No   Sexual activity: Not on file  Other Topics Concern   Not on file  Social History Narrative   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  Has smoke detector and wears seat belts.  No firearms. No excess sun exposure. Sees dentist regularly .   Dexa nl October 29, 2003 and 10/29/2006   Husband inhpsicde care for  Throat  Cancer  10-28-2017 passed away July 30, 2018   Social Determinants of Health   Financial Resource Strain: Low Risk    Difficulty of Paying Living Expenses: Not hard at all  Food Insecurity: No Food Insecurity   Worried About Charity fundraiser in the Last Year: Never true   Arboriculturist in the Last Year: Never true  Transportation Needs: No Transportation Needs   Lack of Transportation (Medical): No   Lack of Transportation (Non-Medical): No  Physical Activity: Inactive   Days of Exercise per Week: 0 days   Minutes of Exercise per Session: 0 min  Stress: No Stress Concern Present   Feeling of Stress : Not at all  Social Connections: Moderately Isolated   Frequency of Communication with Friends and Family: More than three times a week   Frequency of Social Gatherings with Friends and Family: More than three times a week   Attends Religious Services: 1 to 4 times per year   Active Member of [employer] or Organizations: No   Attends Archivist Meetings: Never   Marital Status: Widowed    Tobacco Counseling Counseling given: Not Answered   Clinical Intake:  Pre-visit preparation completed: Yes  Pain : 0-10 Pain Score: 7  Pain Type: Chronic pain Pain Location: Knee Pain Orientation: Left, Right Pain Descriptors / Indicators: Aching Pain Onset: More than a month ago Pain Frequency: Constant     BMI - recorded: 28.8 Nutritional Status: BMI 25 -29 Overweight Nutritional Risks: None Diabetes: No  How often do you need to have someone help you when you read  instructions, pamphlets, or other written materials from your doctor or pharmacy?: 1 - Never  Diabetic?No  Interpreter Needed?: No  Information entered by :: Charlott Rakes, LPN   Activities of Daily Living In your present state of health, do you have any difficulty performing the following activities: 04/10/2021  Hearing? N  Vision? N  Difficulty concentrating or making decisions? N  Walking or climbing stairs? N  Dressing or bathing? N  Doing errands, shopping? N  Preparing Food and eating ? N  Using the Toilet? N  In the past six months, have you accidently leaked urine? N  Do you have problems with loss of  bowel control? N  Managing your Medications? N  Managing your Finances? N  Housekeeping or managing your Housekeeping? N  Some recent data might be hidden    Patient Care Team: Panosh, Standley Brooking, MD as PCP - General Paralee Cancel, MD (Orthopedic Surgery) Juanita Craver, MD as Attending Physician (Gastroenterology) Paralee Cancel, MD as Consulting Physician (Orthopedic Surgery)  Indicate any recent Medical Services you may have received from other than [facility] providers in the past year (date may be approximate).     Assessment:   This is a routine wellness examination for Pamela [name].  Hearing/Vision screen Hearing Screening - Comments:: Pt denies any hearing issues  Vision Screening - Comments:: Pt follows up with Dr Sherral Hammers for annual eye exams  Dietary issues and exercise activities discussed: Current Exercise Habits: The patient does not participate in regular exercise at present   Goals Addressed             This Visit's Progress    lose 7 lbs         Depression Screen PHQ 2/9 Scores 04/10/2021 04/04/2020 07/27/2019 12/07/2018 07/05/2018 12/04/2017 06/30/2017  PHQ - 2 Score 1 0 0 2 0 0 0  PHQ- 9 Score - - - - - - -  Exception Documentation - - - Patient refusal - - -    Fall Risk Fall Risk  04/10/2021 04/04/2020 07/27/2019 07/05/2018 12/04/2017  Falls in the past  year? 0 0 0 0 No  Number falls in past yr: 0 0 0 0 -  Injury with Fall? 0 0 0 0 -  Risk for fall due to : Impaired vision Impaired vision - - -  Follow up Falls prevention discussed Falls prevention discussed - - -    FALL RISK PREVENTION PERTAINING TO THE HOME:  Any stairs in or around the home? No  If so, are there any without handrails? No  Home free of loose throw rugs in walkways, pet beds, electrical cords, etc? Yes  Adequate lighting in your home to reduce risk of falls? Yes   ASSISTIVE DEVICES UTILIZED TO PREVENT FALLS:  Life alert? No  Use of a cane, walker or w/c? No  Grab bars in the bathroom? Yes  Shower chair or bench in shower? No  Elevated toilet seat or a handicapped toilet? No   TIMED UP AND GO:  Was the test performed? No .   Cognitive Function: MMSE - Mini Mental State Exam 12/04/2017 05/20/2017  Not completed: (No Data) (No Data)     6CIT Screen 04/10/2021 04/04/2020  What Year? 0 points 0 points  What month? 0 points 0 points  What time? 0 points -  Count back from 20 0 points 0 points  Months in reverse 0 points 0 points  Repeat phrase 0 points 0 points  Total Score 0 -    Immunizations Immunization History  Administered Date(s) Administered   Fluad Quad(high Dose 65+) 06/15/2019, 05/31/2020   Influenza Split 07/15/2011, 06/15/2012   Influenza Whole 07/06/2007, 06/07/2008, 05/16/2009, 05/20/2010   Influenza, High Dose Seasonal PF 06/26/2015, 06/27/2016, 06/15/2017, 06/01/2018   Influenza,inj,Quad PF,6+ Mos 06/20/2013, 06/21/2014   Influenza-Unspecified 07/08/2018   PFIZER(Purple Top)SARS-COV-2 Vaccination 06/07/2020   Pneumococcal Conjugate-13 06/21/2014   Pneumococcal Polysaccharide-23 06/26/2015   Td 07/06/2007, 01/12/2017   Unspecified SARS-COV-2 Vaccination 09/30/2019, 10/21/2019   Zoster Recombinat (Shingrix) 05/10/2018   Zoster, Live 04/11/2010    TDAP status: Up to date  Flu Vaccine status: Due, Education has been provided  regarding the importance of  this vaccine. Advised may receive this vaccine at local pharmacy or Health Dept. Aware to provide a copy of the vaccination record if obtained from local pharmacy or Health Dept. Verbalized acceptance and understanding.  Pneumococcal vaccine status: Up to date  Covid-19 vaccine status: Completed vaccines  Qualifies for Shingles Vaccine? Yes   Zostavax completed Yes   Shingrix Completed?: Yes  Screening Tests Health Maintenance  Topic Date Due   Zoster Vaccines- Shingrix (2 of 2) 07/05/2018   COVID-19 Vaccine (4 - Booster) 09/07/2020   INFLUENZA VACCINE  03/25/2021   MAMMOGRAM  07/26/2022   COLONOSCOPY (Pts 45-60yr Insurance coverage will need to be confirmed)  07/22/2025   TETANUS/TDAP  01/13/2027   DEXA SCAN  Completed   Hepatitis C Screening  Completed   PNA vac Low Risk Adult  Completed   HPV VACCINES  Aged Out    Health Maintenance  Health Maintenance Due  Topic Date Due   Zoster Vaccines- Shingrix (2 of 2) 07/05/2018   COVID-19 Vaccine (4 - Booster) 09/07/2020   INFLUENZA VACCINE  03/25/2021    Colorectal cancer screening: Type of screening: Colonoscopy. Completed 07/23/15. Repeat every 10 years  Mammogram status: Completed 07/26/20. Repeat every year  Bone Density status: Completed 08/28/15. Results reflect: Bone density results: NORMAL. Repeat every 2 years.  Additional Screening:  Hepatitis C Screening:  Completed 12/25/16  Vision Screening: Recommended annual ophthalmology exams for early detection of glaucoma and other disorders of the eye. Is the patient up to date with their annual eye exam?  Yes  Who is the provider or what is the name of the office in which the patient attends annual eye exams? Dr wSherral Hammers If pt is not established with a provider, would they like to be referred to a provider to establish care? No .   Dental Screening: Recommended annual dental exams for proper oral hygiene  Community Resource Referral / Chronic  Care Management: CRR required this visit?  No   CCM required this visit?  No      Plan:     I have personally reviewed and noted the following in the patient's chart:   Medical and social history Use of alcohol, tobacco or illicit drugs  Current medications and supplements including opioid prescriptions.  Functional ability and status Nutritional status Physical activity Advanced directives List of other physicians Hospitalizations, surgeries, and ER visits in previous 12 months Vitals Screenings to include cognitive, depression, and falls Referrals and appointments  In addition, I have reviewed and discussed with patient certain preventive protocols, quality metrics, and best practice recommendations. A written personalized care plan for preventive services as well as general preventive health recommendations were provided to patient.     TWillette Brace LPN   [id number]  Nurse Notes: None

## 2021-06-04 ENCOUNTER — Encounter: Payer: Self-pay | Admitting: Internal Medicine

## 2021-06-06 ENCOUNTER — Other Ambulatory Visit: Payer: Self-pay | Admitting: Internal Medicine

## 2021-06-13 ENCOUNTER — Other Ambulatory Visit: Payer: Self-pay | Admitting: Internal Medicine

## 2021-06-13 DIAGNOSIS — Z1231 Encounter for screening mammogram for malignant neoplasm of breast: Secondary | ICD-10-CM

## 2021-06-13 LAB — IFOBT (OCCULT BLOOD): IFOBT: NEGATIVE

## 2021-06-17 ENCOUNTER — Ambulatory Visit: Payer: Medicare PPO | Attending: Internal Medicine

## 2021-06-17 ENCOUNTER — Other Ambulatory Visit (HOSPITAL_BASED_OUTPATIENT_CLINIC_OR_DEPARTMENT_OTHER): Payer: Self-pay

## 2021-06-17 ENCOUNTER — Other Ambulatory Visit: Payer: Self-pay

## 2021-06-17 DIAGNOSIS — Z23 Encounter for immunization: Secondary | ICD-10-CM

## 2021-06-17 MED ORDER — PFIZER COVID-19 VAC BIVALENT 30 MCG/0.3ML IM SUSP
INTRAMUSCULAR | 0 refills | Status: DC
Start: 2021-06-17 — End: 2022-09-11
  Filled 2021-06-17: qty 0.3, 1d supply, fill #0

## 2021-06-17 NOTE — Progress Notes (Signed)
   Covid-19 Vaccination Clinic  Name:  Pamela [name]    MRN: [id number] DOB: Mar 02, 1951  06/17/2021  [name] was observed post Covid-19 immunization for 15 minutes without incident. She was provided with Vaccine Information Sheet and instruction to access the V-Safe system.   [name] was instructed to call 911 with any severe reactions post vaccine: Difficulty breathing  Swelling of face and throat  A fast heartbeat  A bad rash all over body  Dizziness and weakness   Immunizations Administered     Name Date Dose VIS Date Route   Pfizer Covid-19 Vaccine Bivalent Booster 06/17/2021  1:31 PM 0.3 mL 04/24/2021 Intramuscular   Manufacturer: [place]   Lot: [id number]   [place]: [phone]

## 2021-07-29 ENCOUNTER — Encounter: Payer: Medicare PPO | Admitting: Internal Medicine

## 2021-07-29 ENCOUNTER — Ambulatory Visit
Admission: RE | Admit: 2021-07-29 | Discharge: 2021-07-29 | Disposition: A | Discharge status: Home / Self Care | Payer: Medicare PPO | Source: Ambulatory Visit | Attending: Internal Medicine | Admitting: Internal Medicine

## 2021-07-29 ENCOUNTER — Other Ambulatory Visit: Payer: Self-pay | Admitting: Internal Medicine

## 2021-07-29 DIAGNOSIS — Z1231 Encounter for screening mammogram for malignant neoplasm of breast: Secondary | ICD-10-CM | POA: Diagnosis not present

## 2021-09-03 ENCOUNTER — Other Ambulatory Visit: Payer: Self-pay | Admitting: Internal Medicine

## 2021-09-15 NOTE — Progress Notes (Signed)
Chief Complaint  Patient presents with   Annual Exam    fasting    HPI: Patient  Pamela [name]  71 y.o. comes in today for Preventive Health Care visit  and med eval fu Struggle with anxiety depression  not eating correct   activity  dog related . Decrease motivation . Mar[place] Kitchen Had traveled to be with family was nice.  She is on sertraline 100 mg in the past when increased dose would get dizziness. Not actively suicidal or hopeless.  Just unmotivated.  No changes in physical health otherwise Up-to-date immunizations had COVID last year.  Resolved. Health Maintenance  Topic Date Due   Zoster Vaccines- Shingrix (2 of 2) 03/16/2022 (Originally 07/05/2018)   MAMMOGRAM  07/30/2023   COLONOSCOPY (Pts 45-81yr Insurance coverage will need to be confirmed)  07/22/2025   TETANUS/TDAP  01/13/2027   Pneumonia Vaccine 71 Years old  Completed   INFLUENZA VACCINE  Completed   DEXA SCAN  Completed   COVID-19 Vaccine  Completed   Hepatitis C Screening  Completed   HPV VACCINES  Aged Out   Health Maintenance Review LIFESTYLE:  Exercise:   less  Tobacco/ETS: n Alcohol:  1-2 drinks  Sugar beverages: no Sleep: falling asleep  issue  but can sleep in   Drug use: no HH of  1 pet dog.  Widowed   ROS:  REST of 12 system review negative except as per HPI knees and RC  otherwise ok   IBS constipation.  Has been on fluoxetine in the remote past.  Past Medical History:  Diagnosis Date   Arthritis KNEES, HANDS, SHOULDERS   Depression    Hx of Clostridium difficile infection    Hyperlipidemia, mixed    Impingement syndrome of right shoulder    Intestinal bacterial overgrowth 06/20/2013   Under rx per dr MCollene Mares   Skin cancer    GTonia Brooms   Vaginitis, atrophic    recurrent    Past Surgical History:  Procedure Laterality Date   ABDOMINAL HYSTERECTOMY  1992   W/ BILATERAL SALPINGO-OOPHORECTOMY   CARPAL TUNNEL RELEASE     SHOULDER ARTHROSCOPY WITH ROTATOR CUFF REPAIR  08/06/2012    Procedure: SHOULDER ARTHROSCOPY WITH ROTATOR CUFF REPAIR;  Surgeon: JMagnus Sinning MD;  [facility]  Service: Orthopedics;  Laterality: Right;   SHOULDER ARTHROSCOPY WITH SUBACROMIAL DECOMPRESSION  08/06/2012   Procedure: SHOULDER ARTHROSCOPY WITH SUBACROMIAL DECOMPRESSION;  Surgeon: JMagnus Sinning MD;  [place] [facility]  Service: Orthopedics;  Laterality: Right;  debridement of labral   TONSILLECTOMY  AS CHILD    Family History  Problem Relation Age of Onset   Arthritis Sister         CPPD autoinmmune   Hyperlipidemia Other        family hx   Hypertension Other        family hx   Sudden death Other        family hx   Heart disease Other        family hx   Multiple myeloma Father        deceased   Stroke Mother 755   Social History   Socioeconomic History   Marital status: Widowed    Spouse name: Not on file   Number of children: Not on file   Years of education: Not on file   Highest education level: Not on file  Occupational History   Occupation: retired  Employer: RETIRED  Tobacco Use   Smoking status: Never   Smokeless tobacco: Never  Substance and Sexual Activity   Alcohol use: Yes    Comment: OCCASIONAL   Drug use: No   Sexual activity: Not on file  Other Topics Concern   Not on file  Social History Narrative   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  Has smoke detector and wears seat belts.  No firearms. No excess sun exposure. Sees dentist regularly .   Dexa nl 2003-10-13 and 12-Oct-2006   Husband inhpsicde care for  Throat  Cancer  2017-10-12 passed away 09/11/18   Social Determinants of Health   Financial Resource Strain: Low Risk    Difficulty of Paying Living Expenses: Not hard at all  Food Insecurity: No Food Insecurity   Worried About Charity fundraiser in the Last Year: Never true   Arboriculturist in the Last  Year: Never true  Transportation Needs: No Transportation Needs   Lack of Transportation (Medical): No   Lack of Transportation (Non-Medical): No  Physical Activity: Inactive   Days of Exercise per Week: 0 days   Minutes of Exercise per Session: 0 min  Stress: No Stress Concern Present   Feeling of Stress : Not at all  Social Connections: Moderately Isolated   Frequency of Communication with Friends and Family: More than three times a week   Frequency of Social Gatherings with Friends and Family: More than three times a week   Attends Religious Services: 1 to 4 times per year   Active Member of [employer] or Organizations: No   Attends Archivist Meetings: Never   Marital Status: Widowed    Outpatient Medications Prior to Visit  Medication Sig Dispense Refill   COVID-19 mRNA bivalent vaccine, Pfizer, (PFIZER COVID-19 VAC BIVALENT) injection Inject into the muscle. 0.3 mL 0   PREMARIN vaginal cream USE VAGINALLY AS INSTRUCTED 30 g 0   sertraline (ZOLOFT) 100 MG tablet TAKE ONE TABLET BY MOUTH DAILY 90 tablet 0   VITAMIN D PO Take by mouth.     No facility-administered medications prior to visit.     EXAM:  BP 116/68 (BP Location: Left Arm, Patient Position: Sitting, Cuff Size: Normal)    Pulse 72    Temp 98.7 F (37.1 C) (Oral)    Ht 5' 4" (1.626 m)    Wt 160 lb 9.6 oz (72.8 kg)    SpO2 97%    BMI 27.57 kg/m   Body mass index is 27.57 kg/m. Wt Readings from Last 3 Encounters:  09/16/21 160 lb 9.6 oz (72.8 kg)  07/27/20 151 lb (68.5 kg)  04/04/20 159 lb (72.1 kg)    Physical Exam: Vital signs reviewed YBF:XOVA is a well-developed well-nourished alert cooperative    who appearsr stated age in no acute distress.  HEENT: normocephalic atraumatic , Eyes: PERRL EOM's full, conjunctiva clear, Nares: paten,t no deformity discharge or tenderness., Ears: no deformity EAC's clear TMs with normal landmarks. Mouth:masked  NECK: supple without masses, thyromegaly or  bruits. CHEST/PULM:  Clear to auscultation and percussion breath sounds equal no wheeze , rales or rhonchi. No chest wall deformities or tenderness. Breast: normal by inspection . No dimpling, discharge, masses, tenderness or discharge . CV: PMI is nondisplaced, S1 S2 no gallops, murmurs, rubs. Peripheral  pulses are full without delay.No JVD .  ABDOMEN: Bowel sounds normal nontender  No guard or rebound, no hepato splenomegal no CVA tenderness.   Extremtities:  No clubbing cyanosis or edema, no acute joint swelling or redness no focal atrophy NEURO:  Oriented x3, cranial nerves 3-12 appear to be intact, no obvious focal weakness,gait within normal limits no abnormal reflexes  SKIN: No acute rashes normal turgor, color, no bruising or petechiae. PSYCH: Oriented, good eye contact, no obvious depression anxiety, cognition and judgment appear normal.  Somewhat flat for her LN: no cervical axillary adenopathy  Lab Results  Component Value Date   WBC 7.7 07/27/2020   HGB 12.7 07/27/2020   HCT 37.8 07/27/2020   PLT 219 07/27/2020   GLUCOSE 91 07/27/2020   CHOL 245 (H) 07/27/2020   TRIG 144 07/27/2020   HDL 60 07/27/2020   LDLDIRECT 173.5 06/13/2013   LDLCALC 158 (H) 07/27/2020   ALT 11 07/27/2020   AST 15 07/27/2020   NA 140 07/27/2020   K 4.4 07/27/2020   CL 103 07/27/2020   CREATININE 0.76 07/27/2020   BUN 13 07/27/2020   CO2 30 07/27/2020   TSH 1.40 07/27/2020    BP Readings from Last 3 Encounters:  09/16/21 116/68  07/27/20 124/70  07/27/19 118/64    Lab planreviewed with patient   ASSESSMENT AND PLAN:  Discussed the following assessment and plan:    ICD-10-CM   1. Visit for preventive health examination  Z00.00     2. Medication management  E52.778 Basic metabolic panel    CBC with Differential/Platelet    Hepatic function panel    Lipid panel    TSH    T4, free    T4, free    TSH    Lipid panel    Hepatic function panel    CBC with Differential/Platelet     Basic metabolic panel    3. Primary osteoarthritis, unspecified site  E42.35 Basic metabolic panel    CBC with Differential/Platelet    Hepatic function panel    Lipid panel    TSH    T4, free    T4, free    TSH    Lipid panel    Hepatic function panel    CBC with Differential/Platelet    Basic metabolic panel    4. Hyperlipidemia, unspecified hyperlipidemia type  T61.4 Basic metabolic panel    CBC with Differential/Platelet    Hepatic function panel    Lipid panel    TSH    T4, free    T4, free    TSH    Lipid panel    Hepatic function panel    CBC with Differential/Platelet    Basic metabolic panel    5. Estrogen deficiency  E31.54 Basic metabolic panel    CBC with Differential/Platelet    Hepatic function panel    Lipid panel    TSH    T4, free    DG Bone Density    T4, free    TSH    Lipid panel    Hepatic function panel    CBC with Differential/Platelet    Basic metabolic panel    6. Malaise  R53.81    depressive type amotivation type sx  symptoms but not high risk    Discussed getting back to some type of exercise consider augment with Wellbutrin if appropriate She plans on getting back with the Zoom meeting of hospice groups. Get updated bone density when convenient.  Not  high risk. Plan follow-up video visit in about 2 months in regard to her "inertia"  Return in about 2 months (around 11/14/2021) for video visit .  Patient Care Team: , Standley Brooking, MD as PCP - General Paralee Cancel, MD (Orthopedic Surgery) Juanita Craver, MD as Attending Physician (Gastroenterology) Paralee Cancel, MD as Consulting Physician (Orthopedic Surgery) Patient Instructions   Get back with  exercise . Plan  Like medicine .  Consider adjust medicine  or add .    Lab today .    Can schedule bone density   [facility] last dexa was 2017  [place] [place] [address] (Across from [facility] ) [phone] or main [address] number  8:30AM-5:15  pm  Give me update in 1-2 months .    Video visit in 1-2 mos        Standley Brooking.  M.D.

## 2021-09-16 ENCOUNTER — Ambulatory Visit (INDEPENDENT_AMBULATORY_CARE_PROVIDER_SITE_OTHER): Payer: Medicare PPO | Admitting: Internal Medicine

## 2021-09-16 ENCOUNTER — Encounter: Payer: Self-pay | Admitting: Internal Medicine

## 2021-09-16 VITALS — BP 116/68 | HR 72 | Temp 98.7°F | Ht 64.0 in | Wt 160.6 lb

## 2021-09-16 DIAGNOSIS — E785 Hyperlipidemia, unspecified: Secondary | ICD-10-CM | POA: Diagnosis not present

## 2021-09-16 DIAGNOSIS — R5381 Other malaise: Secondary | ICD-10-CM

## 2021-09-16 DIAGNOSIS — Z0000 Encounter for general adult medical examination without abnormal findings: Secondary | ICD-10-CM

## 2021-09-16 DIAGNOSIS — E2839 Other primary ovarian failure: Secondary | ICD-10-CM | POA: Diagnosis not present

## 2021-09-16 DIAGNOSIS — M1991 Primary osteoarthritis, unspecified site: Secondary | ICD-10-CM

## 2021-09-16 DIAGNOSIS — Z79899 Other long term (current) drug therapy: Secondary | ICD-10-CM

## 2021-09-16 LAB — CBC WITH DIFFERENTIAL/PLATELET
Basophils Absolute: 0 10*3/uL (ref 0.0–0.1)
Basophils Relative: 0.7 % (ref 0.0–3.0)
Eosinophils Absolute: 0 10*3/uL (ref 0.0–0.7)
Eosinophils Relative: 0.7 % (ref 0.0–5.0)
HCT: 38.6 % (ref 36.0–46.0)
Hemoglobin: 12.8 g/dL (ref 12.0–15.0)
Lymphocytes Relative: 33.9 % (ref 12.0–46.0)
Lymphs Abs: 1.6 10*3/uL (ref 0.7–4.0)
MCHC: 33.1 g/dL (ref 30.0–36.0)
MCV: 81.2 fl (ref 78.0–100.0)
Monocytes Absolute: 0.3 10*3/uL (ref 0.1–1.0)
Monocytes Relative: 6.7 % (ref 3.0–12.0)
Neutro Abs: 2.8 10*3/uL (ref 1.4–7.7)
Neutrophils Relative %: 58 % (ref 43.0–77.0)
Platelets: 208 10*3/uL (ref 150.0–400.0)
RBC: 4.76 Mil/uL (ref 3.87–5.11)
RDW: 14.4 % (ref 11.5–15.5)
WBC: 4.8 10*3/uL (ref 4.0–10.5)

## 2021-09-16 LAB — BASIC METABOLIC PANEL
BUN: 21 mg/dL (ref 6–23)
CO2: 29 mEq/L (ref 19–32)
Calcium: 9.4 mg/dL (ref 8.4–10.5)
Chloride: 101 mEq/L (ref 96–112)
Creatinine, Ser: 0.95 mg/dL (ref 0.40–1.20)
GFR: 59.91 mL/min — ABNORMAL LOW (ref >60.00)
Glucose, Bld: 91 mg/dL (ref 70–99)
Potassium: 4.5 mEq/L (ref 3.5–5.1)
Sodium: 139 mEq/L (ref 135–145)

## 2021-09-16 LAB — HEPATIC FUNCTION PANEL
ALT: 14 U/L (ref 0–35)
AST: 17 U/L (ref 0–37)
Albumin: 4.6 g/dL (ref 3.5–5.2)
Alkaline Phosphatase: 56 U/L (ref 39–117)
Bilirubin, Direct: 0.1 mg/dL (ref 0.0–0.3)
Total Bilirubin: 0.5 mg/dL (ref 0.2–1.2)
Total Protein: 6.9 g/dL (ref 6.0–8.3)

## 2021-09-16 LAB — LIPID PANEL
Cholesterol: 295 mg/dL — ABNORMAL HIGH (ref 0–200)
HDL: 74.5 mg/dL (ref >39.00)
LDL Cholesterol: 189 mg/dL — ABNORMAL HIGH (ref 0–99)
NonHDL: 220
Total CHOL/HDL Ratio: 4
Triglycerides: 154 mg/dL — ABNORMAL HIGH (ref 0.0–149.0)
VLDL: 30.8 mg/dL (ref 0.0–40.0)

## 2021-09-16 LAB — TSH: TSH: 1.69 u[IU]/mL (ref 0.35–5.50)

## 2021-09-16 LAB — T4, FREE: Free T4: 0.76 ng/dL (ref 0.60–1.60)

## 2021-09-16 NOTE — Patient Instructions (Addendum)
°  Get back with  exercise . Plan  Like medicine .  Consider adjust medicine  or add .    Lab today .    Can schedule bone density   [facility] last dexa was 2017  [place] [place] [address] (Across from [facility] ) [phone] or main [place] number  8:30AM-5:15 pm  Give me update in 1-2 months .    Video visit in 1-2 mos

## 2021-09-20 NOTE — Progress Notes (Signed)
Cholesterol back up  again .  Are  you interested in trying a different statin  such as Crestor or pravastatin or even zetia  . We can discuss at your follow up visit . And  in interim Intensify lifestyle interventions.

## 2021-11-08 ENCOUNTER — Encounter: Payer: Self-pay | Admitting: Internal Medicine

## 2021-11-08 DIAGNOSIS — Z79899 Other long term (current) drug therapy: Secondary | ICD-10-CM

## 2021-11-08 DIAGNOSIS — E785 Hyperlipidemia, unspecified: Secondary | ICD-10-CM

## 2021-11-11 ENCOUNTER — Other Ambulatory Visit: Payer: Self-pay | Admitting: Internal Medicine

## 2021-11-12 NOTE — Telephone Encounter (Signed)
I would advise low-dose rosuvastatin as it has more outcomes data. ? ?New medication can lower cholesterol appears to be expensive and does not yet have as good evidence of clinical outcomes.  So I would only try that if all else fails. ?The new medicine also can raise uric acid level and flare of gout in people who are susceptible.  So everything has a positive and a negative. ? ?I advised trying Crestor 5 mg 3 days a week.  If tolerated you can increase to daily. ?Check lipid panel after on a given dose of Crestor 3 to 4 months ? ?Pamela [name] please send in enough Crestor for 90 days refill x1 ?

## 2021-11-13 NOTE — Telephone Encounter (Signed)
Noted  

## 2021-11-14 ENCOUNTER — Telehealth: Payer: Medicare PPO | Admitting: Internal Medicine

## 2021-12-04 ENCOUNTER — Telehealth: Payer: Self-pay | Admitting: Internal Medicine

## 2021-12-04 MED ORDER — ROSUVASTATIN CALCIUM 5 MG PO TABS: ORAL_TABLET | ORAL | 3 refills | Status: DC

## 2021-12-04 MED ORDER — ESTROGENS CONJUGATED 0.625 MG/GM VA CREA: TOPICAL_CREAM | VAGINAL | 5 refills | Status: DC

## 2021-12-04 MED ORDER — ESTROGENS CONJUGATED 0.625 MG/GM VA CREA: TOPICAL_CREAM | VAGINAL | 12 refills | Status: DC

## 2021-12-04 NOTE — Telephone Encounter (Signed)
Please  copy whatever patient sig was previously .

## 2021-12-04 NOTE — Telephone Encounter (Signed)
I will send in  low dose crestor and premarin with refills to your pharmacy .  ?As planned in my note . ? ?

## 2021-12-04 NOTE — Telephone Encounter (Signed)
Sent in meds  and message to patient ?

## 2021-12-04 NOTE — Addendum Note (Signed)
Addended byBurnis Medin on: 12/04/2021 09:34 AM ? ? Modules accepted: Orders ? ?

## 2021-12-04 NOTE — Telephone Encounter (Signed)
Rx re-sent as previously prescribed per medication history. ?

## 2021-12-05 NOTE — Telephone Encounter (Signed)
Noted  

## 2021-12-11 NOTE — Progress Notes (Signed)
?Pamela [name] D.O. ?[facility] ?[place] ?Phone: [phone] ?Subjective:   ?I, Pamela [name], am serving as a Education administrator for Dr. Hulan Saas. ?This visit occurred during the SARS-CoV-2 public health emergency.  Safety protocols were in place, including screening questions prior to the visit, additional usage of staff PPE, and extensive cleaning of exam room while observing appropriate contact time as indicated for disinfecting solutions.  ? ?I'm seeing this patient by the request  of:  Panosh, Standley Brooking, MD ? ?CC: shoulder pain  ? ?DPO:[id number]  ?Pamela [name] is a 71 y.o. female coming in with complaint of shoulder pain.Over the last couple of months pain has gotten worse. Lifting arm hurts. Constant ache, sharp with movement. Limited ROM at this time. Clicking and popping is present. Location is mostly on top of shoulder. Asprin helps with pain. Pain sometimes wakes her at night. Also having trouble with both knees. ?Saw patient in 11/06/17 for rotator cuff arthropathy of the right shoulder.  Patient was given an injection at that time. ? ? ?  ? ?Past Medical History:  ?Diagnosis Date  ? Arthritis KNEES, HANDS, SHOULDERS  ? Depression   ? Hx of Clostridium difficile infection   ? Hyperlipidemia, mixed   ? Impingement syndrome of right shoulder   ? Intestinal bacterial overgrowth 06/20/2013  ? Under rx per dr Collene Mares   ? Skin cancer   ? Pamela [name]   ? Vaginitis, atrophic   ? recurrent  ? ?Past Surgical History:  ?Procedure Laterality Date  ? ABDOMINAL HYSTERECTOMY  1992  ? W/ BILATERAL SALPINGO-OOPHORECTOMY  ? CARPAL TUNNEL RELEASE    ? SHOULDER ARTHROSCOPY WITH ROTATOR CUFF REPAIR  08/06/2012  ? Procedure: SHOULDER ARTHROSCOPY WITH ROTATOR CUFF REPAIR;  Surgeon: Magnus Sinning, MD;  Location: [place];  Service: Orthopedics;  Laterality: Right;  ? SHOULDER ARTHROSCOPY WITH SUBACROMIAL DECOMPRESSION  08/06/2012  ? Procedure: SHOULDER ARTHROSCOPY WITH  SUBACROMIAL DECOMPRESSION;  Surgeon: Magnus Sinning, MD;  Location: [place];  Service: Orthopedics;  Laterality: Right;  debridement of labral  ? TONSILLECTOMY  AS CHILD  ? ?Social History  ? ?Socioeconomic History  ? Marital status: Widowed  ?  Spouse name: Not on file  ? Number of children: Not on file  ? Years of education: Not on file  ? Highest education level: Not on file  ?Occupational History  ? Occupation: retired  ?  Employer: RETIRED  ?Tobacco Use  ? Smoking status: Never  ? Smokeless tobacco: Never  ?Substance and Sexual Activity  ? Alcohol use: Yes  ?  Comment: OCCASIONAL  ? Drug use: No  ? Sexual activity: Not on file  ?Other Topics Concern  ? Not on file  ?Social History Narrative  ? 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 Widowed 2018/08/08  ? G0P0  ? Ref xercise  ? HH of 1 ppet dog   ? No falls .  Has smoke detector and wears seat belts.  No firearms. No excess sun exposure. Sees dentist regularly .  ? Dexa nl Nov 07, 2003 and November 07, 2006  ? Husband inhpsicde care for  Throat  Cancer  November 06, 2017 passed away August 08, 2018  ? ?Social Determinants of Health  ? ?Financial Resource Strain: Low Risk   ? Difficulty of Paying Living Expenses: Not hard at all  ?Food Insecurity: No Food Insecurity  ? Worried About Charity fundraiser in the Last Year: Never true  ?  Ran Out of Food in the Last Year: Never true  ?Transportation Needs: No Transportation Needs  ? Lack of Transportation (Medical): No  ? Lack of Transportation (Non-Medical): No  ?Physical Activity: Inactive  ? Days of Exercise per Week: 0 days  ? Minutes of Exercise per Session: 0 min  ?Stress: No Stress Concern Present  ? Feeling of Stress : Not at all  ?Social Connections: Moderately Isolated  ? Frequency of Communication with Friends and Family: More than three times a week  ? Frequency of Social Gatherings with Friends and Family: More than three times a week  ? Attends Religious Services: 1 to 4 times per year  ? Active Member  of Clubs or Organizations: No  ? Attends Archivist Meetings: Never  ? Marital Status: Widowed  ? ?Allergies  ?Allergen Reactions  ? Simvastatin Other (See Comments)  ?  JOINT ACHES  ? Atorvastatin   ?  Muscle aches  ? ?Family History  ?Problem Relation Age of Onset  ? Arthritis Sister   ?      CPPD autoinmmune  ? Hyperlipidemia Other   ?     family hx  ? Hypertension Other   ?     family hx  ? Sudden death Other   ?     family hx  ? Heart disease Other   ?     family hx  ? Multiple myeloma Father   ?     deceased  ? Stroke Mother 26  ? ? ? ?Current Outpatient Medications (Cardiovascular):  ?  rosuvastatin (CRESTOR) 5 MG tablet, Take crestor 5 mg po 3 days per week  and then as directed . ? ? ? ? ?Current Outpatient Medications (Other):  ?  conjugated estrogens (PREMARIN) vaginal cream, Use vaginally as instructed twice a day ?  COVID-19 mRNA bivalent vaccine, Pfizer, (PFIZER COVID-19 VAC BIVALENT) injection, Inject into the muscle. ?  PREMARIN vaginal cream, USE VAGINALLY AS INSTRUCTED ?  sertraline (ZOLOFT) 100 MG tablet, TAKE ONE TABLET BY MOUTH DAILY ?  VITAMIN D PO, Take by mouth. ? ? ?Reviewed prior external information including notes and imaging from  ?primary care provider ?As well as notes that were available from care everywhere and other healthcare systems. ? ?Past medical history, social, surgical and family history all reviewed in electronic medical record.  No pertanent information unless stated regarding to the chief complaint.  ? ?Review of Systems: ? No headache, visual changes, nausea, vomiting, diarrhea, constipation, dizziness, abdominal pain, skin rash, fevers, chills, night sweats, weight loss, swollen lymph nodes, body aches, joint swelling, chest pain, shortness of breath, mood changes. POSITIVE muscle aches ? ?Objective  ?Blood pressure 132/76, pulse 77, height '5\' 4"'  (1.626 m), weight 161 lb (73 kg), SpO2 98 %. ?  ?General: No apparent distress alert and oriented x3 mood and  affect normal, dressed appropriately.  ?HEENT: Pupils equal, extraocular movements intact  ?Respiratory: Patient's speak in full sentences and does not appear short of breath  ?Cardiovascular: No lower extremity edema, non tender, no erythema  ?Gait normal with good balance and coordination.  ?MSK: Right shoulder exam does show some crepitus.  Patient does have some limited range of motion in all planes.  Rotator cuff strength 4 out of 5 compared to the contralateral side.  Positive impingement noted.  Positive pain over the acromioclavicular joint. ?Knees bilaterally do have some arthritic changes but patient does have swelling more of the left knee.  More tenderness over the  left side as well.  Patient does have some mild instability noted with valgus and varus force. ? ?Procedure: Real-time Ultrasound Guided Injection of right glenohumeral joint ?Device: GE Logiq Q7  ?Ultrasound guided injection is preferred based studies that show increased duration, increased effect, greater accuracy, decreased procedural pain, increased response rate with ultrasound guided versus blind injection.  ?Verbal informed consent obtained.  ?Time-out conducted.  ?Noted no overlying erythema, induration, or other signs of local infection.  ?Skin prepped in a sterile fashion.  ?Local anesthesia: Topical Ethyl chloride.  ?With sterile technique and under real time ultrasound guidance:  Joint visualized.  23g 1 ? inch needle inserted posterior approach. Pictures taken for needle placement. Patient did have injection of  2 cc of 0.5% Marcaine, and 1.0 cc of Kenalog 40 mg/dL. ?Completed without difficulty  ?Pain immediately improved suggesting accurate placement of the medication.  ?Advised to call if fevers/chills, erythema, induration, drainage, or persistent bleeding.  ?Impression: Technically successful ultrasound guided injection. ? ?Procedure: Real-time Ultrasound Guided Injection of right acromioclavicular joint ?Device: GE Logiq  Q7 ?Ultrasound guided injection is preferred based studies that show increased duration, increased effect, greater accuracy, decreased procedural pain, increased response rate, and decreased cost with ultrasound guid

## 2021-12-12 ENCOUNTER — Ambulatory Visit (INDEPENDENT_AMBULATORY_CARE_PROVIDER_SITE_OTHER): Payer: Medicare PPO | Admitting: Family Medicine

## 2021-12-12 ENCOUNTER — Ambulatory Visit: Payer: Self-pay

## 2021-12-12 ENCOUNTER — Ambulatory Visit (INDEPENDENT_AMBULATORY_CARE_PROVIDER_SITE_OTHER): Payer: Medicare PPO

## 2021-12-12 VITALS — BP 132/76 | HR 77 | Ht 64.0 in | Wt 161.0 lb

## 2021-12-12 DIAGNOSIS — G8929 Other chronic pain: Secondary | ICD-10-CM | POA: Diagnosis not present

## 2021-12-12 DIAGNOSIS — M25511 Pain in right shoulder: Secondary | ICD-10-CM

## 2021-12-12 DIAGNOSIS — M12811 Other specific arthropathies, not elsewhere classified, right shoulder: Secondary | ICD-10-CM

## 2021-12-12 DIAGNOSIS — M25562 Pain in left knee: Secondary | ICD-10-CM

## 2021-12-12 DIAGNOSIS — M170 Bilateral primary osteoarthritis of knee: Secondary | ICD-10-CM | POA: Diagnosis not present

## 2021-12-12 DIAGNOSIS — M25561 Pain in right knee: Secondary | ICD-10-CM | POA: Diagnosis not present

## 2021-12-12 DIAGNOSIS — M19011 Primary osteoarthritis, right shoulder: Secondary | ICD-10-CM | POA: Diagnosis not present

## 2021-12-12 DIAGNOSIS — M1712 Unilateral primary osteoarthritis, left knee: Secondary | ICD-10-CM | POA: Diagnosis not present

## 2021-12-12 DIAGNOSIS — M19019 Primary osteoarthritis, unspecified shoulder: Secondary | ICD-10-CM | POA: Insufficient documentation

## 2021-12-12 NOTE — Assessment & Plan Note (Signed)
Chronic with worsening symptoms.  Patient will get new x-rays.  Hopefully patient does respond relatively well to this.  Discussed which activities to do and which ones to avoid. ?

## 2021-12-12 NOTE — Patient Instructions (Addendum)
Good to see you  ?Injections given today ?X rays on your way out  ?Rotator and patella femoral exercises given  ?Tart cherry extract '1200mg'$  at night ?Vitamin D 2000 IU daily  ?Ice 20 minutes 2 times daily. Usually after activity and before bed.  ?Follow up in 2 months  ? ? ? ?

## 2021-12-12 NOTE — Assessment & Plan Note (Signed)
Repeat injection given today, tolerated the procedure well, discussed icing regimen and home exercise, which activities to do which ones to avoid.  Do feel like patient has had some progression of the arthritic changes.  Will increase activity slowly.  Given injection into the acromioclavicular joint as well.  Follow-up with me again in 48 weeks. ?

## 2021-12-12 NOTE — Assessment & Plan Note (Signed)
Left knee injection given.  Seems to be mostly in the patellofemoral joint.  We will get x-rays to further evaluate.  Increase activity slowly.  Follow-up with me again in 6 to 8 weeks.  May be a candidate for viscosupplementation. ?

## 2021-12-15 ENCOUNTER — Other Ambulatory Visit: Payer: Self-pay | Admitting: Internal Medicine

## 2021-12-18 NOTE — Telephone Encounter (Signed)
Last Ov 09/16/21 ?Filled 09/12/21 ?Is it ok to refill? ?

## 2022-02-06 ENCOUNTER — Ambulatory Visit: Payer: Medicare PPO | Admitting: Family Medicine

## 2022-02-10 NOTE — Progress Notes (Unsigned)
[place] Phone: [phone] Subjective:   Pamela [name], am serving as a scribe for Dr. Hulan Saas.   I'm seeing this patient by the request  of:  Panosh, Standley Brooking, MD  CC: Right shoulder pain follow-up  PPI:[id number]  12/12/2021 Chronic with worsening symptoms.  Patient will get new x-rays.  Hopefully patient does respond relatively well to this.  Discussed which activities to do and which ones to avoid.  Left knee injection given.  Seems to be mostly in the patellofemoral joint.  We will get x-rays to further evaluate.  Increase activity slowly.  Follow-up with me again in 6 to 8 weeks.  May be a candidate for viscosupplementation.  Repeat injection given today, tolerated the procedure well, discussed icing regimen and home exercise, which activities to do which ones to avoid.  Do feel like patient has had some progression of the arthritic changes.  Will increase activity slowly.  Given injection into the acromioclavicular joint as well.  Follow-up with me again in 48 weeks.  Updated 02/11/2022 Pamela [name] is a 71 y.o. female coming in with complaint of shoulder and knee pain. Patient states she has less pain in shoulder but still has pain with flexion in anterior shoulder. Painful if she lifts anything heavy.   Knee pain has improved but do feel worse with the weather changes.   Xrays IMPRESSION: 1. Mild to moderate glenohumeral and acromioclavicular osteoarthritis. 2. Flattening of the glenoid fossa which increases risk for multidirectional instability.  IMPRESSION: Minimal bilateral medial compartment of the knee joint space narrowing.     Past Medical History:  Diagnosis Date   Arthritis KNEES, HANDS, SHOULDERS   Depression    Hx of Clostridium difficile infection    Hyperlipidemia, mixed    Impingement syndrome of right shoulder    Intestinal bacterial overgrowth 06/20/2013   Under  rx per dr Collene Mares    Skin cancer    [facility]    Vaginitis, atrophic    recurrent   Past Surgical History:  Procedure Laterality Date   ABDOMINAL HYSTERECTOMY  1992   W/ BILATERAL SALPINGO-OOPHORECTOMY   CARPAL TUNNEL RELEASE     SHOULDER ARTHROSCOPY WITH ROTATOR CUFF REPAIR  08/06/2012   Procedure: SHOULDER ARTHROSCOPY WITH ROTATOR CUFF REPAIR;  Surgeon: Magnus Sinning, MD;  Location: [facility];  Service: Orthopedics;  Laterality: Right;   SHOULDER ARTHROSCOPY WITH SUBACROMIAL DECOMPRESSION  08/06/2012   Procedure: SHOULDER ARTHROSCOPY WITH SUBACROMIAL DECOMPRESSION;  Surgeon: Magnus Sinning, MD;  Location: [facility];  Service: Orthopedics;  Laterality: Right;  debridement of labral   TONSILLECTOMY  AS CHILD   Social History   Socioeconomic History   Marital status: Widowed    Spouse name: Not on file   Number of children: Not on file   Years of education: Not on file   Highest education level: Not on file  Occupational History   Occupation: retired    Fish farm manager: RETIRED  Tobacco Use   Smoking status: Never   Smokeless tobacco: Never  Substance and Sexual Activity   Alcohol use: Yes    Comment: OCCASIONAL   Drug use: [name]   Sexual activity: Not on file  Other Topics Concern   Not on file  Social History Narrative   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  Has smoke detector and wears seat belts.  [name] firearms. [name] excess sun exposure. Sees dentist regularly .   Dexa nl 11/23/2003 and 11-23-06   Husband inhpsicde care for  Throat  Cancer  11/22/2017 passed away 08-24-2018   Social Determinants of Health   Financial Resource Strain: Low Risk  (04/10/2021)   Overall Financial Resource Strain (CARDIA)    Difficulty of Paying Living Expenses: Not hard at all  Food Insecurity: [name] Food Insecurity (04/10/2021)   Hunger Vital Sign    Worried About Running Out of Food in  the Last Year: Never true    Ran Out of Food in the Last Year: Never true  Transportation Needs: [name] Transportation Needs (04/10/2021)   PRAPARE - Hydrologist (Medical): [name]    Lack of Transportation (Non-Medical): [name]  Physical Activity: Inactive (04/10/2021)   Exercise Vital Sign    Days of Exercise per Week: 0 days    Minutes of Exercise per Session: 0 min  Stress: [name] Stress Concern Present (04/10/2021)   [place]    Feeling of Stress : Not at all  Social Connections: Moderately Isolated (04/10/2021)   Social Connection and Isolation Panel [NHANES]    Frequency of Communication with Friends and Family: More than three times a week    Frequency of Social Gatherings with Friends and Family: More than three times a week    Attends Religious Services: 1 to 4 times per year    Active Member of [employer] or Organizations: [name]    Attends Archivist Meetings: Never    Marital Status: Widowed   Allergies  Allergen Reactions   Simvastatin Other (See Comments)    JOINT ACHES   Atorvastatin     Muscle aches   Family History  Problem Relation Age of Onset   Arthritis Sister         CPPD autoinmmune   Hyperlipidemia Other        family hx   Hypertension Other        family hx   Sudden death Other        family hx   Heart disease Other        family hx   Multiple myeloma Father        deceased   Stroke Mother 28     Current Outpatient Medications (Cardiovascular):    rosuvastatin (CRESTOR) 5 MG tablet, Take crestor 5 mg po 3 days per week  and then as directed .     Current Outpatient Medications (Other):    conjugated estrogens (PREMARIN) vaginal cream, Use vaginally as instructed twice a day   COVID-19 mRNA bivalent vaccine, Pfizer, (PFIZER COVID-19 VAC BIVALENT) injection, Inject into the muscle.   diazepam (VALIUM) 5 MG tablet, One tab by mouth, 2 hours before procedure.    PREMARIN vaginal cream, USE VAGINALLY AS INSTRUCTED   sertraline (ZOLOFT) 100 MG tablet, TAKE ONE TABLET BY MOUTH DAILY   VITAMIN D PO, Take by mouth.   Reviewed prior external information including notes and imaging from  primary care provider As well as notes that were available from care everywhere and other healthcare systems.  Past medical history, social, surgical and family history all reviewed in electronic medical record.  [name] pertanent information unless stated regarding to the chief complaint.   Review of Systems:  [name] headache, visual changes, nausea,  vomiting, diarrhea, constipation, dizziness, abdominal pain, skin rash, fevers, chills, night sweats, weight loss, swollen lymph nodes, body aches, joint swelling, chest pain, shortness of breath, mood changes. POSITIVE muscle aches  Objective  Blood pressure 98/60, pulse 77, height _0  (1.626 m), weight 163 lb (73.9 kg), SpO2 99 %.   General: [name] apparent distress alert and oriented x3 mood and affect normal, dressed appropriately.  HEENT: Pupils equal, extraocular movements intact  Respiratory: Patient's speak in full sentences and does not appear short of breath  Cardiovascular: [name] lower extremity edema, non tender, [name] erythema  Right shoulder exam does show some very mild atrophy of the musculature.  Still some limited motion in all planes.  Patient does have positive impingement noted.  Rotator cuff strength 3+ out of 5.  Limited muscular skeletal ultrasound was performed and interpreted by Hulan Saas, M  Limited ultrasound shows the patient still has hypoechoic changes of the bicep tendon noted and an abnormality of the anterior labrum.  Patient does have what appears to be a fairly large tear that is at least high-grade of the supraspinatus noted. Impression: Rotator cuff tear but difficult to assess the severity of it.      Impression and Recommendations:       The above documentation has been reviewed and is  accurate and complete Lyndal Pulley, DO

## 2022-02-11 ENCOUNTER — Ambulatory Visit: Payer: Medicare PPO | Admitting: Family Medicine

## 2022-02-11 ENCOUNTER — Ambulatory Visit: Payer: Self-pay

## 2022-02-11 ENCOUNTER — Encounter: Payer: Self-pay | Admitting: Family Medicine

## 2022-02-11 VITALS — BP 98/60 | HR 77 | Ht 64.0 in | Wt 163.0 lb

## 2022-02-11 DIAGNOSIS — M12811 Other specific arthropathies, not elsewhere classified, right shoulder: Secondary | ICD-10-CM

## 2022-02-11 DIAGNOSIS — M25511 Pain in right shoulder: Secondary | ICD-10-CM | POA: Diagnosis not present

## 2022-02-11 MED ORDER — DIAZEPAM 5 MG PO TABS: ORAL_TABLET | ORAL | 0 refills | Status: DC

## 2022-02-11 NOTE — Assessment & Plan Note (Signed)
Concerned that patient may be having worsening arthritis or fairly large rotator cuff tear.  Discussed with patient about the possibility of different treatment options.  Patient is elected to try an MRI.  I do believe the patient should do relatively well but has had difficulty with claustrophobia and getting evaluated.  Depending on findings and the severity of arthritis we will see if patient is a candidate for surgical intervention or the possibility of PRP.

## 2022-02-11 NOTE — Patient Instructions (Addendum)
MRA R shoulder [phone] We will be in touch

## 2022-02-27 ENCOUNTER — Ambulatory Visit
Admission: RE | Admit: 2022-02-27 | Discharge: 2022-02-27 | Disposition: A | Discharge status: Home / Self Care | Payer: Medicare PPO | Source: Ambulatory Visit | Attending: Family Medicine | Admitting: Family Medicine

## 2022-02-27 DIAGNOSIS — M25511 Pain in right shoulder: Secondary | ICD-10-CM | POA: Diagnosis not present

## 2022-02-27 DIAGNOSIS — S46011A Strain of muscle(s) and tendon(s) of the rotator cuff of right shoulder, initial encounter: Secondary | ICD-10-CM | POA: Diagnosis not present

## 2022-02-27 MED ORDER — IOPAMIDOL (ISOVUE-M 200) INJECTION 41%
13.0000 mL | Freq: Once | INTRAMUSCULAR | Status: AC
  Administered 2022-02-27: 13 mL via INTRA_ARTICULAR

## 2022-03-06 ENCOUNTER — Encounter: Payer: Self-pay | Admitting: Family Medicine

## 2022-03-06 ENCOUNTER — Other Ambulatory Visit (INDEPENDENT_AMBULATORY_CARE_PROVIDER_SITE_OTHER): Payer: Medicare PPO

## 2022-03-06 DIAGNOSIS — Z79899 Other long term (current) drug therapy: Secondary | ICD-10-CM

## 2022-03-06 DIAGNOSIS — E785 Hyperlipidemia, unspecified: Secondary | ICD-10-CM | POA: Diagnosis not present

## 2022-03-06 LAB — LIPID PANEL
Cholesterol: 221 mg/dL — ABNORMAL HIGH (ref 0–200)
HDL: 76.3 mg/dL (ref >39.00)
LDL Cholesterol: 124 mg/dL — ABNORMAL HIGH (ref 0–99)
NonHDL: 144.97
Total CHOL/HDL Ratio: 3
Triglycerides: 104 mg/dL (ref 0.0–149.0)
VLDL: 20.8 mg/dL (ref 0.0–40.0)

## 2022-03-11 NOTE — Progress Notes (Unsigned)
[place] Phone: [phone] Subjective:   Pamela [name], am serving as a scribe for Dr. Hulan Saas.   I'm seeing this patient by the request  of:  Panosh, Standley Brooking, MD  CC: Right shoulder pain follow-up  CNO:[id number]  02/11/2022 Concerned that patient may be having worsening arthritis or fairly large rotator cuff tear.  Discussed with patient about the possibility of different treatment options.  Patient is elected to try an MRI.  I do believe the patient should do relatively well but has had difficulty with claustrophobia and getting evaluated.  Depending on findings and the severity of arthritis we will see if patient is a candidate for surgical intervention or the possibility of PRP.  Update 03/12/2022 Pamela [name] is a 71 y.o. female coming in with complaint of R shoulder pain. Patient states that  her pain is the same as last visit. [name] worse.       Past Medical History:  Diagnosis Date   Arthritis KNEES, HANDS, SHOULDERS   Depression    Hx of Clostridium difficile infection    Hyperlipidemia, mixed    Impingement syndrome of right shoulder    Intestinal bacterial overgrowth 06/20/2013   Under rx per dr Collene Mares    Skin cancer    Tonia Brooms    Vaginitis, atrophic    recurrent   Past Surgical History:  Procedure Laterality Date   ABDOMINAL HYSTERECTOMY  1992   W/ BILATERAL SALPINGO-OOPHORECTOMY   CARPAL TUNNEL RELEASE     SHOULDER ARTHROSCOPY WITH ROTATOR CUFF REPAIR  08/06/2012   Procedure: SHOULDER ARTHROSCOPY WITH ROTATOR CUFF REPAIR;  Surgeon: Magnus Sinning, MD;  Location: [place];  Service: Orthopedics;  Laterality: Right;   SHOULDER ARTHROSCOPY WITH SUBACROMIAL DECOMPRESSION  08/06/2012   Procedure: SHOULDER ARTHROSCOPY WITH SUBACROMIAL DECOMPRESSION;  Surgeon: Magnus Sinning, MD;  Location: [facility];  Service: Orthopedics;  Laterality: Right;   debridement of labral   TONSILLECTOMY  AS CHILD   Social History   Socioeconomic History   Marital status: Widowed    Spouse name: Not on file   Number of children: Not on file   Years of education: Not on file   Highest education level: Not on file  Occupational History   Occupation: retired    Fish farm manager: RETIRED  Tobacco Use   Smoking status: Never   Smokeless tobacco: Never  Substance and Sexual Activity   Alcohol use: Yes    Comment: OCCASIONAL   Drug use: [name]   Sexual activity: Not on file  Other Topics Concern   Not on file  Social History Narrative   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  Has smoke detector and wears seat belts.  [name] firearms. [name] excess sun exposure. Sees dentist regularly .   Dexa nl 2003/10/30 and 10-30-2006   Husband inhpsicde care for  Throat  Cancer  10-29-17 passed away 2018-07-31   Social Determinants of Health   Financial Resource Strain: Low Risk  (04/10/2021)   Overall Financial Resource Strain (CARDIA)    Difficulty of Paying Living Expenses: Not hard at all  Food Insecurity: [name] Food Insecurity (04/10/2021)   Hunger Vital Sign    Worried About Running Out of Food in the Last Year: Never true  Ran Out of Food in the Last Year: Never true  Transportation Needs: [name] Transportation Needs (04/10/2021)   PRAPARE - Hydrologist (Medical): [name]    Lack of Transportation (Non-Medical): [name]  Physical Activity: Inactive (04/10/2021)   Exercise Vital Sign    Days of Exercise per Week: 0 days    Minutes of Exercise per Session: 0 min  Stress: [name] Stress Concern Present (04/10/2021)   [place]    Feeling of Stress : Not at all  Social Connections: Moderately Isolated (04/10/2021)   Social Connection and Isolation Panel [NHANES]    Frequency of Communication with Friends and Family:  More than three times a week    Frequency of Social Gatherings with Friends and Family: More than three times a week    Attends Religious Services: 1 to 4 times per year    Active Member of [employer] or Organizations: [name]    Attends Archivist Meetings: Never    Marital Status: Widowed   Allergies  Allergen Reactions   Simvastatin Other (See Comments)    JOINT ACHES   Atorvastatin     Muscle aches   Family History  Problem Relation Age of Onset   Arthritis Sister         CPPD autoinmmune   Hyperlipidemia Other        family hx   Hypertension Other        family hx   Sudden death Other        family hx   Heart disease Other        family hx   Multiple myeloma Father        deceased   Stroke Mother 41     Current Outpatient Medications (Cardiovascular):    rosuvastatin (CRESTOR) 5 MG tablet, Take crestor 5 mg po 3 days per week  and then as directed .     Current Outpatient Medications (Other):    conjugated estrogens (PREMARIN) vaginal cream, Use vaginally as instructed twice a day   COVID-19 mRNA bivalent vaccine, Pfizer, (PFIZER COVID-19 VAC BIVALENT) injection, Inject into the muscle.   diazepam (VALIUM) 5 MG tablet, One tab by mouth, 2 hours before procedure.   PREMARIN vaginal cream, USE VAGINALLY AS INSTRUCTED   sertraline (ZOLOFT) 100 MG tablet, TAKE ONE TABLET BY MOUTH DAILY   VITAMIN D PO, Take by mouth.    Objective  Blood pressure 112/72, pulse 67, height '5\' 4"'  (1.626 m), weight 165 lb (74.8 kg), SpO2 98 %.   General: [name] apparent distress alert and oriented x3 mood and affect normal, dressed appropriately.  HEENT: Pupils equal, extraocular movements intact  Respiratory: Patient's speak in full sentences and does not appear short of breath  Cardiovascular: [name] lower extremity edema, non tender, [name] erythema  Right shoulder exam still has positive impingement noted.  Procedure: Real-time Ultrasound Guided Injection of right supraspinatus  tendon Device: GE Logiq Q7  Ultrasound guided injection is preferred based studies that show increased duration, increased effect, greater accuracy, decreased procedural pain, increased response rate with ultrasound guided versus blind injection.  Verbal informed consent obtained.  Time-out conducted.  Noted [name] overlying erythema, induration, or other signs of local infection.  Skin prepped in a sterile fashion.  Local anesthesia: Topical Ethyl chloride.  With sterile technique and under real time ultrasound guidance:  Joint visualized.  23g 1  inch needle inserted posterior approach. Pictures taken for needle placement.  Patient did have injection of 0.5 cc of 0.5% Marcaine and then injected with 5 cc PRP. Pain immediately resolved suggesting accurate placement of the medication.  Advised to call if fevers/chills, erythema, induration, drainage, or persistent bleeding.  Impression: Technically successful ultrasound guided injection.    Impression and Recommendations:    The above documentation has been reviewed and is accurate and complete Lyndal Pulley, DO

## 2022-03-12 ENCOUNTER — Ambulatory Visit: Payer: Self-pay

## 2022-03-12 ENCOUNTER — Encounter: Payer: Self-pay | Admitting: Family Medicine

## 2022-03-12 ENCOUNTER — Ambulatory Visit (INDEPENDENT_AMBULATORY_CARE_PROVIDER_SITE_OTHER): Payer: Self-pay | Admitting: Family Medicine

## 2022-03-12 VITALS — BP 112/72 | HR 67 | Ht 64.0 in | Wt 165.0 lb

## 2022-03-12 DIAGNOSIS — M25511 Pain in right shoulder: Secondary | ICD-10-CM

## 2022-03-12 DIAGNOSIS — M12811 Other specific arthropathies, not elsewhere classified, right shoulder: Secondary | ICD-10-CM

## 2022-03-12 NOTE — Patient Instructions (Signed)
No ice or IBU for 3 days Heat and Tylenol are ok See me again in 6 weeks 

## 2022-03-12 NOTE — Assessment & Plan Note (Signed)
PRP given today.  Tolerated the procedure well, discussed icing regimen and home exercises.  Discussed which activities to do and which ones to avoid.  We discussed for the first 30 days or to avoid icing regimen.  Post PRP handout and protocol given.  Follow-up again in 6 weeks.

## 2022-03-13 NOTE — Progress Notes (Signed)
LDL cholesterol much better  If not having side effects can try increasing   crestor 4 days per week.

## 2022-03-16 ENCOUNTER — Other Ambulatory Visit: Payer: Self-pay | Admitting: Internal Medicine

## 2022-03-18 ENCOUNTER — Ambulatory Visit (HOSPITAL_BASED_OUTPATIENT_CLINIC_OR_DEPARTMENT_OTHER)
Admission: RE | Admit: 2022-03-18 | Discharge: 2022-03-18 | Disposition: A | Discharge status: Home / Self Care | Payer: Medicare PPO | Source: Ambulatory Visit | Attending: Internal Medicine | Admitting: Internal Medicine

## 2022-03-18 DIAGNOSIS — E2839 Other primary ovarian failure: Secondary | ICD-10-CM | POA: Insufficient documentation

## 2022-03-18 DIAGNOSIS — Z780 Asymptomatic menopausal state: Secondary | ICD-10-CM | POA: Diagnosis not present

## 2022-03-23 NOTE — Progress Notes (Signed)
Normal bone density  results .

## 2022-04-11 DIAGNOSIS — D2271 Melanocytic nevi of right lower limb, including hip: Secondary | ICD-10-CM | POA: Diagnosis not present

## 2022-04-11 DIAGNOSIS — D225 Melanocytic nevi of trunk: Secondary | ICD-10-CM | POA: Diagnosis not present

## 2022-04-11 DIAGNOSIS — Z85828 Personal history of other malignant neoplasm of skin: Secondary | ICD-10-CM | POA: Diagnosis not present

## 2022-04-11 DIAGNOSIS — L570 Actinic keratosis: Secondary | ICD-10-CM | POA: Diagnosis not present

## 2022-04-11 DIAGNOSIS — L719 Rosacea, unspecified: Secondary | ICD-10-CM | POA: Diagnosis not present

## 2022-04-11 DIAGNOSIS — L565 Disseminated superficial actinic porokeratosis (DSAP): Secondary | ICD-10-CM | POA: Diagnosis not present

## 2022-04-11 DIAGNOSIS — L821 Other seborrheic keratosis: Secondary | ICD-10-CM | POA: Diagnosis not present

## 2022-04-11 DIAGNOSIS — L578 Other skin changes due to chronic exposure to nonionizing radiation: Secondary | ICD-10-CM | POA: Diagnosis not present

## 2022-04-14 ENCOUNTER — Encounter: Payer: Self-pay | Admitting: Internal Medicine

## 2022-04-22 NOTE — Progress Notes (Unsigned)
Pamela [name] [place] [address] [place] Phone: [phone] Subjective:   [name], am serving as a scribe for Dr. Hulan Saas.  I'm seeing this patient by the request  of:  Panosh, Standley Brooking, MD  CC: Right shoulder pain follow-up  ASN:[id number]  03/12/2022 PRP given today.  Tolerated the procedure well, discussed icing regimen and home exercises.  Discussed which activities to do and which ones to avoid.  We discussed for the first 30 days or to avoid icing regimen.  Post PRP handout and protocol given.  Follow-up again in 6 weeks.  Update 04/23/2022 Pamela [name] is a 71 y.o. female coming in with complaint of R shoulder pain. Patient states doing a little better. No other or new issues.       Past Medical History:  Diagnosis Date   Arthritis KNEES, HANDS, SHOULDERS   Depression    Hx of Clostridium difficile infection    Hyperlipidemia, mixed    Impingement syndrome of right shoulder    Intestinal bacterial overgrowth 06/20/2013   Under rx per dr Collene Mares    Skin cancer    Tonia Brooms    Vaginitis, atrophic    recurrent   Past Surgical History:  Procedure Laterality Date   ABDOMINAL HYSTERECTOMY  1992   W/ BILATERAL SALPINGO-OOPHORECTOMY   CARPAL TUNNEL RELEASE     SHOULDER ARTHROSCOPY WITH ROTATOR CUFF REPAIR  08/06/2012   Procedure: SHOULDER ARTHROSCOPY WITH ROTATOR CUFF REPAIR;  Surgeon: Magnus Sinning, MD;  Location: [facility];  Service: Orthopedics;  Laterality: Right;   SHOULDER ARTHROSCOPY WITH SUBACROMIAL DECOMPRESSION  08/06/2012   Procedure: SHOULDER ARTHROSCOPY WITH SUBACROMIAL DECOMPRESSION;  Surgeon: Magnus Sinning, MD;  Location: [facility];  Service: Orthopedics;  Laterality: Right;  debridement of labral   TONSILLECTOMY  AS CHILD   Social History   Socioeconomic History   Marital status: Widowed    Spouse name: Not on file   Number of children: Not on file   Years  of education: Not on file   Highest education level: Not on file  Occupational History   Occupation: retired    Fish farm manager: RETIRED  Tobacco Use   Smoking status: Never   Smokeless tobacco: Never  Substance and Sexual Activity   Alcohol use: Yes    Comment: OCCASIONAL   Drug use: No   Sexual activity: Not on file  Other Topics Concern   Not on file  Social History Narrative   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  Has smoke detector and wears seat belts.  No firearms. No excess sun exposure. Sees dentist regularly .   Dexa nl 10-28-2003 and 10-28-2006   Husband inhpsicde care for  Throat  Cancer  10/27/17 passed away 07/29/18   Social Determinants of Health   Financial Resource Strain: Low Risk  (04/10/2021)   Overall Financial Resource Strain (CARDIA)    Difficulty of Paying Living Expenses: Not hard at all  Food Insecurity: No Food Insecurity (04/10/2021)   Hunger Vital Sign    Worried About Running Out of Food in the Last Year: Never true    Ran Out of Food in the Last Year: Never true  Transportation Needs: No Transportation Needs (04/10/2021)   PRAPARE - Transportation    Lack of  Transportation (Medical): No    Lack of Transportation (Non-Medical): No  Physical Activity: Inactive (04/10/2021)   Exercise Vital Sign    Days of Exercise per Week: 0 days    Minutes of Exercise per Session: 0 min  Stress: No Stress Concern Present (04/10/2021)   [place]    Feeling of Stress : Not at all  Social Connections: Moderately Isolated (04/10/2021)   Social Connection and Isolation Panel [NHANES]    Frequency of Communication with Friends and Family: More than three times a week    Frequency of Social Gatherings with Friends and Family: More than three times a week    Attends Religious Services: 1 to 4 times per year    Active Member of  [employer] or Organizations: No    Attends Archivist Meetings: Never    Marital Status: Widowed   Allergies  Allergen Reactions   Simvastatin Other (See Comments)    JOINT ACHES   Atorvastatin     Muscle aches   Family History  Problem Relation Age of Onset   Arthritis Sister         CPPD autoinmmune   Hyperlipidemia Other        family hx   Hypertension Other        family hx   Sudden death Other        family hx   Heart disease Other        family hx   Multiple myeloma Father        deceased   Stroke Mother 89     Current Outpatient Medications (Cardiovascular):    rosuvastatin (CRESTOR) 5 MG tablet, Take crestor 5 mg po 3 days per week  and then as directed .     Current Outpatient Medications (Other):    conjugated estrogens (PREMARIN) vaginal cream, Use vaginally as instructed twice a day   COVID-19 mRNA bivalent vaccine, Pfizer, (PFIZER COVID-19 VAC BIVALENT) injection, Inject into the muscle.   diazepam (VALIUM) 5 MG tablet, One tab by mouth, 2 hours before procedure.   PREMARIN vaginal cream, USE VAGINALLY AS INSTRUCTED   sertraline (ZOLOFT) 100 MG tablet, TAKE ONE TABLET BY MOUTH DAILY   VITAMIN D PO, Take by mouth.   Reviewed prior external information including notes and imaging from  primary care provider As well as notes that were available from care everywhere and other healthcare systems.  Past medical history, social, surgical and family history all reviewed in electronic medical record.  No pertanent information unless stated regarding to the chief complaint.   Review of Systems:  No headache, visual changes, nausea, vomiting, diarrhea, constipation, dizziness, abdominal pain, skin rash, fevers, chills, night sweats, weight loss, swollen lymph nodes, body aches, joint swelling, chest pain, shortness of breath, mood changes. POSITIVE muscle aches  Objective  Blood pressure 136/86, pulse 67, height '5\' 4"'  (1.626 m), weight 164 lb (74.4 kg),  SpO2 98 %.   General: No apparent distress alert and oriented x3 mood and affect normal, dressed appropriately.  HEENT: Pupils equal, extraocular movements intact  Respiratory: Patient's speak in full sentences and does not appear short of breath  Cardiovascular: No lower extremity edema, non tender, no erythema  Right shoulder exam does have improvement in the rotator cuff strength.  Still some limited range of motion.  Positive impingement with Neer and Hawkins still noted as well.  Limited muscular skeletal ultrasound was performed and interpreted by Hulan Saas, M  Limited ultrasound that showed the arthritic changes of the acromioclavicular joint and laterally of the glenohumeral joint.  Rotator cuff does appear to be intact.  Some mild increase in hypoechoic changes of the supraspinatus that could be consistent with intersubstance tearing that is healing.  Significant decrease in the hypoechoic changes that was previously seen. Impression: Interval improvement    Impression and Recommendations:

## 2022-04-23 ENCOUNTER — Ambulatory Visit: Payer: Medicare PPO | Admitting: Family Medicine

## 2022-04-23 ENCOUNTER — Encounter: Payer: Self-pay | Admitting: Family Medicine

## 2022-04-23 ENCOUNTER — Ambulatory Visit: Payer: Self-pay

## 2022-04-23 VITALS — BP 136/86 | HR 67 | Ht 64.0 in | Wt 164.0 lb

## 2022-04-23 DIAGNOSIS — M25511 Pain in right shoulder: Secondary | ICD-10-CM | POA: Diagnosis not present

## 2022-04-23 DIAGNOSIS — M12811 Other specific arthropathies, not elsewhere classified, right shoulder: Secondary | ICD-10-CM

## 2022-04-23 NOTE — Assessment & Plan Note (Signed)
Patient is responding well to the PRP.  We will see how much range of motion patient gets with the underlying arthritic changes. Discussed with patient during I do feel that the rotator cuff has improved and hopefully this will slow down the progression.  Discussed icing regimen and home exercises.  Follow-up again in 2 months to make sure patient is 90% better at that time hopefully.

## 2022-04-23 NOTE — Patient Instructions (Addendum)
Does look 60-70% healed Ok to do eccentric and yoga 2x a week

## 2022-04-30 ENCOUNTER — Ambulatory Visit (INDEPENDENT_AMBULATORY_CARE_PROVIDER_SITE_OTHER): Payer: Medicare PPO

## 2022-04-30 ENCOUNTER — Ambulatory Visit: Payer: Medicare PPO

## 2022-04-30 VITALS — Ht 64.0 in | Wt 164.0 lb

## 2022-04-30 DIAGNOSIS — Z0000 Encounter for general adult medical examination without abnormal findings: Secondary | ICD-10-CM

## 2022-04-30 NOTE — Patient Instructions (Signed)
[name] , Thank you for taking time to come for your Medicare Wellness Visit. I appreciate your ongoing commitment to your health goals. Please review the following plan we discussed and let me know if I can assist you in the future.   Screening recommendations/referrals: Colonoscopy: Done Repeat 10 yrs Mammogram: Done  Bone Density: Done Recommended yearly ophthalmology/optometry visit for glaucoma screening and checkup Recommended yearly dental visit for hygiene and checkup  Vaccinations: Influenza vaccine: Up to date Pneumococcal vaccine: Up to date Tdap vaccine: Up to date Shingles vaccine: Done   Covid-19:Done  Advanced directives: Please bring a copy of your health care power of attorney and living will to the office to be added to your chart at your convenience.   Conditions/risks identified: None  Next appointment: Follow up in one year for your annual wellness visit     Preventive Care 65 Years and Older, Female Preventive care refers to lifestyle choices and visits with your health care provider that can promote health and wellness. What does preventive care include? A yearly physical exam. This is also called an annual well check. Dental exams once or twice a year. Routine eye exams. Ask your health care provider how often you should have your eyes checked. Personal lifestyle choices, including: Daily care of your teeth and gums. Regular physical activity. Eating a healthy diet. Avoiding tobacco and drug use. Limiting alcohol use. Practicing safe sex. Taking low-dose aspirin every day. Taking vitamin and mineral supplements as recommended by your health care provider. What happens during an annual well check? The services and screenings done by your health care provider during your annual well check will depend on your age, overall health, lifestyle risk factors, and family history of disease. Counseling  Your health care provider may ask you questions about  your: Alcohol use. Tobacco use. Drug use. Emotional well-being. Home and relationship well-being. Sexual activity. Eating habits. History of falls. Memory and ability to understand (cognition). Work and work Statistician. Reproductive health. Screening  You may have the following tests or measurements: Height, weight, and BMI. Blood pressure. Lipid and cholesterol levels. These may be checked every 5 years, or more frequently if you are over 82 years old. Skin check. Lung cancer screening. You may have this screening every year starting at age 39 if you have a 30-pack-year history of smoking and currently smoke or have quit within the past 15 years. Fecal occult blood test (FOBT) of the stool. You may have this test every year starting at age 43. Flexible sigmoidoscopy or colonoscopy. You may have a sigmoidoscopy every 5 years or a colonoscopy every 10 years starting at age 36. Hepatitis C blood test. Hepatitis B blood test. Sexually transmitted disease (STD) testing. Diabetes screening. This is done by checking your blood sugar (glucose) after you have not eaten for a while (fasting). You may have this done every 1-3 years. Bone density scan. This is done to screen for osteoporosis. You may have this done starting at age 84. Mammogram. This may be done every 1-2 years. Talk to your health care provider about how often you should have regular mammograms. Talk with your health care provider about your test results, treatment options, and if necessary, the need for more tests. Vaccines  Your health care provider may recommend certain vaccines, such as: Influenza vaccine. This is recommended every year. Tetanus, diphtheria, and acellular pertussis (Tdap, Td) vaccine. You may need a Td booster every 10 years. Zoster vaccine. You may need this after  age 20. Pneumococcal 13-valent conjugate (PCV13) vaccine. One dose is recommended after age 63. Pneumococcal polysaccharide (PPSV23) vaccine.  One dose is recommended after age 42. Talk to your health care provider about which screenings and vaccines you need and how often you need them. This information is not intended to replace advice given to you by your health care provider. Make sure you discuss any questions you have with your health care provider. Document Released: 09/07/2015 Document Revised: 04/30/2016 Document Reviewed: 06/12/2015 Elsevier Interactive Patient Education  2017 [place] Prevention in the Home Falls can cause injuries. They can happen to people of all ages. There are many things you can do to make your home safe and to help prevent falls. What can I do on the outside of my home? Regularly fix the edges of walkways and driveways and fix any cracks. Remove anything that might make you trip as you walk through a door, such as a raised step or threshold. Trim any bushes or trees on the path to your home. Use bright outdoor lighting. Clear any walking paths of anything that might make someone trip, such as rocks or tools. Regularly check to see if handrails are loose or broken. Make sure that both sides of any steps have handrails. Any raised decks and porches should have guardrails on the edges. Have any leaves, snow, or ice cleared regularly. Use sand or salt on walking paths during winter. Clean up any spills in your garage right away. This includes oil or grease spills. What can I do in the bathroom? Use night lights. Install grab bars by the toilet and in the tub and shower. Do not use towel bars as grab bars. Use non-skid mats or decals in the tub or shower. If you need to sit down in the shower, use a plastic, non-slip stool. Keep the floor dry. Clean up any water that spills on the floor as soon as it happens. Remove soap buildup in the tub or shower regularly. Attach bath mats securely with double-sided non-slip rug tape. Do not have throw rugs and other things on the floor that can make  you trip. What can I do in the bedroom? Use night lights. Make sure that you have a light by your bed that is easy to reach. Do not use any sheets or blankets that are too big for your bed. They should not hang down onto the floor. Have a firm chair that has side arms. You can use this for support while you get dressed. Do not have throw rugs and other things on the floor that can make you trip. What can I do in the kitchen? Clean up any spills right away. Avoid walking on wet floors. Keep items that you use a lot in easy-to-reach places. If you need to reach something above you, use a strong step stool that has a grab bar. Keep electrical cords out of the way. Do not use floor polish or wax that makes floors slippery. If you must use wax, use non-skid floor wax. Do not have throw rugs and other things on the floor that can make you trip. What can I do with my stairs? Do not leave any items on the stairs. Make sure that there are handrails on both sides of the stairs and use them. Fix handrails that are broken or loose. Make sure that handrails are as long as the stairways. Check any carpeting to make sure that it is firmly attached to the stairs.  Fix any carpet that is loose or worn. Avoid having throw rugs at the top or bottom of the stairs. If you do have throw rugs, attach them to the floor with carpet tape. Make sure that you have a light switch at the top of the stairs and the bottom of the stairs. If you do not have them, ask someone to add them for you. What else can I do to help prevent falls? Wear shoes that: Do not have high heels. Have rubber bottoms. Are comfortable and fit you well. Are closed at the toe. Do not wear sandals. If you use a stepladder: Make sure that it is fully opened. Do not climb a closed stepladder. Make sure that both sides of the stepladder are locked into place. Ask someone to hold it for you, if possible. Clearly mark and make sure that you can  see: Any grab bars or handrails. First and last steps. Where the edge of each step is. Use tools that help you move around (mobility aids) if they are needed. These include: Canes. Walkers. Scooters. Crutches. Turn on the lights when you go into a dark area. Replace any light bulbs as soon as they burn out. Set up your furniture so you have a clear path. Avoid moving your furniture around. If any of your floors are uneven, fix them. If there are any pets around you, be aware of where they are. Review your medicines with your doctor. Some medicines can make you feel dizzy. This can increase your chance of falling. Ask your doctor what other things that you can do to help prevent falls. This information is not intended to replace advice given to you by your health care provider. Make sure you discuss any questions you have with your health care provider. Document Released: 06/07/2009 Document Revised: 01/17/2016 Document Reviewed: 09/15/2014 Elsevier Interactive Patient Education  2017 [employer].

## 2022-04-30 NOTE — Progress Notes (Signed)
Subjective:   Pamela [name] is a 71 y.o. female who presents for Medicare Annual (Subsequent) preventive examination.  Review of Systems    Virtual Visit via Telephone Note  I connected with  Pamela [name] on 04/30/22 at 11:00 AM EDT by telephone and verified that I am speaking with the correct person using two identifiers.  Location: Patient: Home Provider: Office Persons participating in the virtual visit: patient/Nurse Health Advisor   I discussed the limitations, risks, security and privacy concerns of performing an evaluation and management service by telephone and the availability of in person appointments. The patient expressed understanding and agreed to proceed.  Interactive audio and video telecommunications were attempted between this nurse and patient, however failed, due to patient having technical difficulties OR patient did not have access to video capability.  We continued and completed visit with audio only.  Some vital signs may be absent or patient reported.   Criselda Peaches, LPN  Cardiac Risk Factors include: advanced age (>20mn, >>5women)     Objective:    Today's Vitals   04/30/22 1107  Weight: 164 lb (74.4 kg)  Height: _0  (1.626 m)   Body mass index is 28.15 kg/m.     04/30/2022   11:15 AM 04/10/2021   11:00 AM 04/04/2020   11:06 AM 12/04/2017    4:37 PM 05/20/2017    4:41 PM 08/06/2012   10:43 AM  Advanced Directives  Does Patient Have a Medical Advance Directive? _1  Patient would like information  Type of Advance Directive Healthcare Power of [place] will Living will;Healthcare Power of [place]     Copy of [place] Chart? No - copy requested No - copy requested No - copy requested     Would patient like information on creating a medical advance directive?      Advance directive packet given    Current Medications (verified) Outpatient  Encounter Medications as of 04/30/2022  Medication Sig   conjugated estrogens (PREMARIN) vaginal cream Use vaginally as instructed twice a day   COVID-19 mRNA bivalent vaccine, Pfizer, (PFIZER COVID-19 VAC BIVALENT) injection Inject into the muscle.   diazepam (VALIUM) 5 MG tablet One tab by mouth, 2 hours before procedure.   PREMARIN vaginal cream USE VAGINALLY AS INSTRUCTED   rosuvastatin (CRESTOR) 5 MG tablet Take crestor 5 mg po 3 days per week  and then as directed .   sertraline (ZOLOFT) 100 MG tablet TAKE ONE TABLET BY MOUTH DAILY   VITAMIN D PO Take by mouth.   No facility-administered encounter medications on file as of 04/30/2022.    Allergies (verified) Simvastatin and Atorvastatin   History: Past Medical History:  Diagnosis Date   Arthritis KNEES, HANDS, SHOULDERS   Depression    Hx of Clostridium difficile infection    Hyperlipidemia, mixed    Impingement syndrome of right shoulder    Intestinal bacterial overgrowth 06/20/2013   Under rx per dr MCollene Mares   Skin cancer    GTonia Brooms   Vaginitis, atrophic    recurrent   Past Surgical History:  Procedure Laterality Date   ABDOMINAL HYSTERECTOMY  1992   W/ BILATERAL SALPINGO-OOPHORECTOMY   CARPAL TUNNEL RELEASE     SHOULDER ARTHROSCOPY WITH ROTATOR CUFF REPAIR  08/06/2012   Procedure: SHOULDER ARTHROSCOPY WITH ROTATOR CUFF REPAIR;  Surgeon: JMagnus Sinning MD;  Location[facility]  Service: Orthopedics;  Laterality: Right;  SHOULDER ARTHROSCOPY WITH SUBACROMIAL DECOMPRESSION  08/06/2012   Procedure: SHOULDER ARTHROSCOPY WITH SUBACROMIAL DECOMPRESSION;  Surgeon: Magnus Sinning, MD;  Location: [facility];  Service: Orthopedics;  Laterality: Right;  debridement of labral   TONSILLECTOMY  AS CHILD   Family History  Problem Relation Age of Onset   Arthritis Sister         CPPD autoinmmune   Hyperlipidemia Other        family hx   Hypertension Other        family hx   Sudden death  Other        family hx   Heart disease Other        family hx   Multiple myeloma Father        deceased   Stroke Mother 37   Social History   Socioeconomic History   Marital status: Widowed    Spouse name: Not on file   Number of children: Not on file   Years of education: Not on file   Highest education level: Not on file  Occupational History   Occupation: retired    Fish farm manager: RETIRED  Tobacco Use   Smoking status: Never   Smokeless tobacco: Never  Substance and Sexual Activity   Alcohol use: Yes    Comment: OCCASIONAL   Drug use: No   Sexual activity: Not on file  Other Topics Concern   Not on file  Social History Narrative   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  Has smoke detector and wears seat belts.  No firearms. No excess sun exposure. Sees dentist regularly .   Dexa nl 2003-10-16 and 10-15-2006   Husband inhpsicde care for  Throat  Cancer  15-Oct-2017 passed away 2018/08/14   Social Determinants of Health   Financial Resource Strain: Low Risk  (04/30/2022)   Overall Financial Resource Strain (CARDIA)    Difficulty of Paying Living Expenses: Not hard at all  Food Insecurity: No Food Insecurity (04/30/2022)   Hunger Vital Sign    Worried About Running Out of Food in the Last Year: Never true    Ran Out of Food in the Last Year: Never true  Transportation Needs: No Transportation Needs (04/30/2022)   PRAPARE - Hydrologist (Medical): No    Lack of Transportation (Non-Medical): No  Physical Activity: Sufficiently Active (04/30/2022)   Exercise Vital Sign    Days of Exercise per Week: 7 days    Minutes of Exercise per Session: 30 min  Stress: No Stress Concern Present (04/30/2022)   [place]    Feeling of Stress : Not at all  Social Connections: Moderately Integrated (04/30/2022)   Social  Connection and Isolation Panel [NHANES]    Frequency of Communication with Friends and Family: More than three times a week    Frequency of Social Gatherings with Friends and Family: More than three times a week    Attends Religious Services: More than 4 times per year    Active Member of [employer] or Organizations: Yes    Attends Archivist Meetings: More than 4 times per year    Marital Status: Widowed    Tobacco Counseling Counseling given: Not Answered   Clinical Intake:  Pre-visit preparation completed: No  Pain :  No/denies pain     BMI - recorded: 28.14 Nutritional Status: BMI 25 -29 Overweight Nutritional Risks: None Diabetes: No  How often do you need to have someone help you when you read instructions, pamphlets, or other written materials from your doctor or pharmacy?: 1 - Never  Diabetic?  No  Interpreter Needed?: No  Information entered by :: Rolene Arbour LPN   Activities of Daily Living    04/30/2022   11:14 AM  In your present state of health, do you have any difficulty performing the following activities:  Hearing? 0  Vision? 0  Difficulty concentrating or making decisions? 0  Walking or climbing stairs? 0  Dressing or bathing? 0  Doing errands, shopping? 0  Preparing Food and eating ? N  Using the Toilet? N  In the past six months, have you accidently leaked urine? N  Do you have problems with loss of bowel control? N  Managing your Medications? N  Managing your Finances? N  Housekeeping or managing your Housekeeping? N    Patient Care Team: Panosh, Standley Brooking, MD as PCP - General Paralee Cancel, MD (Orthopedic Surgery) Juanita Craver, MD as Attending Physician (Gastroenterology) Paralee Cancel, MD as Consulting Physician (Orthopedic Surgery)  Indicate any recent Medical Services you may have received from other than [facility] providers in the past year (date may be approximate).     Assessment:   This is a routine wellness examination for  Pamela [name].  Hearing/Vision screen Hearing Screening - Comments:: Denies hearing difficulties   Vision Screening - Comments:: Wears rx glasses - up to date with routine eye exams with  Dr Clydene Laming  Dietary issues and exercise activities discussed:     Goals Addressed               This Visit's Progress     Patient stated (pt-stated)        Continue book club and exercise.        Depression Screen    04/30/2022   11:11 AM 09/16/2021   10:36 AM 04/10/2021   10:58 AM 04/04/2020   11:04 AM 07/27/2019   12:11 PM 12/07/2018    8:52 AM 07/05/2018    8:57 AM  PHQ 2/9 Scores  PHQ - 2 Score 0 2 1 0 0 2 0  PHQ- 9 Score  9       Exception Documentation      Patient refusal     Fall Risk    04/30/2022   11:14 AM 09/16/2021   10:36 AM 04/10/2021   11:00 AM 04/04/2020   11:07 AM 07/27/2019   12:11 PM  Fall Risk   Falls in the past year? 0 0 0 0 0  Number falls in past yr: 0  0 0 0  Injury with Fall? 0  0 0 0  Risk for fall due to : No Fall Risks  Impaired vision Impaired vision   Follow up   Falls prevention discussed Falls prevention discussed     FALL RISK PREVENTION PERTAINING TO THE HOME:  Any stairs in or around the home? No  If so, are there any without handrails? No  Home free of loose throw rugs in walkways, pet beds, electrical cords, etc? Yes  Adequate lighting in your home to reduce risk of falls? Yes   ASSISTIVE DEVICES UTILIZED TO PREVENT FALLS:  Life alert? No  Use of a cane, walker or w/c? No  Grab bars in the bathroom? Yes  Shower chair or bench  in shower? Yes  Elevated toilet seat or a handicapped toilet? No   TIMED UP AND GO:  Was the test performed? No . Audio Visit   Cognitive Function:        04/30/2022   11:16 AM 04/10/2021   11:02 AM 04/04/2020   11:09 AM  6CIT Screen  What Year? 0 points 0 points 0 points  What month? 0 points 0 points 0 points  What time? 0 points 0 points   Count back from 20 0 points 0 points 0 points  Months in reverse 0  points 0 points 0 points  Repeat phrase 0 points 0 points 0 points  Total Score 0 points 0 points     Immunizations Immunization History  Administered Date(s) Administered   Fluad Quad(high Dose 65+) 06/15/2019, 05/31/2020   Influenza Split 07/15/2011, 06/15/2012   Influenza Whole 07/06/2007, 06/07/2008, 05/16/2009, 05/20/2010   Influenza, High Dose Seasonal PF 06/26/2015, 06/27/2016, 06/15/2017, 06/01/2018   Influenza,inj,Quad PF,6+ Mos 06/20/2013, 06/21/2014   Influenza-Unspecified 07/08/2018, 06/23/2021   PFIZER(Purple Top)SARS-COV-2 Vaccination 06/07/2020   Pfizer Covid-19 Vaccine Bivalent Booster 23yr & up 06/17/2021   Pneumococcal Conjugate-13 06/21/2014   Pneumococcal Polysaccharide-23 06/26/2015   Td 07/06/2007, 01/12/2017   Unspecified SARS-COV-2 Vaccination 09/30/2019, 10/21/2019   Zoster Recombinat (Shingrix) 05/10/2018   Zoster, Live 04/11/2010    TDAP status: Up to date  Flu Vaccine status: Up to date  Pneumococcal vaccine status: Up to date  Covid-19 vaccine status: Completed vaccines  Qualifies for Shingles Vaccine? Yes   Zostavax completed Yes   Shingrix Completed?: Yes  Screening Tests Health Maintenance  Topic Date Due   Zoster Vaccines- Shingrix (2 of 2) 07/05/2018   COVID-19 Vaccine (5 - Mixed Product risk series) 05/16/2022 (Originally 08/12/2021)   INFLUENZA VACCINE  11/23/2022 (Originally 03/25/2022)   MAMMOGRAM  07/30/2023   COLONOSCOPY (Pts 45-451yrInsurance coverage will need to be confirmed)  07/22/2025   TETANUS/TDAP  01/13/2027   Pneumonia Vaccine 6564Years old  Completed   DEXA SCAN  Completed   Hepatitis C Screening  Completed   HPV VACCINES  Aged Out    Health Maintenance  Health Maintenance Due  Topic Date Due   Zoster Vaccines- Shingrix (2 of 2) 07/05/2018    Colorectal cancer screening: Type of screening: Colonoscopy. Completed 07/23/15. Repeat every 10 years  Mammogram status: Completed 07/29/21. Repeat every year  Bone  Density status: Completed 03/18/22. Results reflect: Bone density results: OSTEOPOROSIS. Repeat every   years.  Lung Cancer Screening: (Low Dose CT Chest recommended if Age 73-80ears, 30 pack-year currently smoking OR have quit w/in 15years.) does not qualify.     Additional Screening:  Hepatitis C Screening: does qualify; Completed 12/25/16  Vision Screening: Recommended annual ophthalmology exams for early detection of glaucoma and other disorders of the eye. Is the patient up to date with their annual eye exam?  Yes  Who is the provider or what is the name of the office in which the patient attends annual eye exams? Dr WoClydene LamingIf pt is not established with a provider, would they like to be referred to a provider to establish care? No .   Dental Screening: Recommended annual dental exams for proper oral hygiene  Community Resource Referral / Chronic Care Management:  CRR required this visit?  No   CCM required this visit?  No      Plan:     I have personally reviewed and noted the following in the patient's chart:  Medical and social history Use of alcohol, tobacco or illicit drugs  Current medications and supplements including opioid prescriptions. Patient is not currently taking opioid prescriptions. Functional ability and status Nutritional status Physical activity Advanced directives List of other physicians Hospitalizations, surgeries, and ER visits in previous 12 months Vitals Screenings to include cognitive, depression, and falls Referrals and appointments  In addition, I have reviewed and discussed with patient certain preventive protocols, quality metrics, and best practice recommendations. A written personalized care plan for preventive services as well as general preventive health recommendations were provided to patient.     Criselda Peaches, LPN   0/02/3709   Nurse Notes: None

## 2022-05-19 ENCOUNTER — Other Ambulatory Visit: Payer: Self-pay | Admitting: Internal Medicine

## 2022-05-27 DIAGNOSIS — Z86010 Personal history of colonic polyps: Secondary | ICD-10-CM | POA: Diagnosis not present

## 2022-05-27 DIAGNOSIS — K5900 Constipation, unspecified: Secondary | ICD-10-CM | POA: Diagnosis not present

## 2022-05-27 DIAGNOSIS — Z1211 Encounter for screening for malignant neoplasm of colon: Secondary | ICD-10-CM | POA: Diagnosis not present

## 2022-05-27 DIAGNOSIS — E782 Mixed hyperlipidemia: Secondary | ICD-10-CM | POA: Diagnosis not present

## 2022-05-27 DIAGNOSIS — F32A Depression, unspecified: Secondary | ICD-10-CM | POA: Diagnosis not present

## 2022-06-18 ENCOUNTER — Other Ambulatory Visit: Payer: Self-pay | Admitting: Internal Medicine

## 2022-06-23 ENCOUNTER — Ambulatory Visit: Payer: Medicare PPO | Admitting: Family Medicine

## 2022-06-24 ENCOUNTER — Other Ambulatory Visit: Payer: Self-pay | Admitting: Internal Medicine

## 2022-06-24 DIAGNOSIS — Z1231 Encounter for screening mammogram for malignant neoplasm of breast: Secondary | ICD-10-CM

## 2022-07-23 NOTE — Progress Notes (Unsigned)
[place] Phone: [phone] Subjective:    I'm seeing this patient by the request  of:  Panosh, Standley Brooking, MD  CC: Right shoulder pain follow-up and new ankle pain  MBW:[id number]  04/23/2022 Patient is responding well to the PRP.  We will see how much range of motion patient gets with the underlying arthritic changes. Discussed with patient during I do feel that the rotator cuff has improved and hopefully this will slow down the progression.  Discussed icing regimen and home exercises.  Follow-up again in 2 months to make sure patient is 90% better at that time hopefully.   Update 07/24/2022 Pamela [name] is a 71 y.o. female coming in with complaint of R shoulder pain. Patient states that the right shoulder is better, but not well. States still is painful and still having issues with ROM. Patient sprained her right ankle about 2 months ago stepped down on to a tree root. Patient states that her ankle is still swollen and hurts in the achillis tendon.  Female      Past Medical History:  Diagnosis Date   Arthritis KNEES, HANDS, SHOULDERS   Depression    Hx of Clostridium difficile infection    Hyperlipidemia, mixed    Impingement syndrome of right shoulder    Intestinal bacterial overgrowth 06/20/2013   Under rx per dr Collene Mares    Skin cancer    Tonia Brooms    Vaginitis, atrophic    recurrent   Past Surgical History:  Procedure Laterality Date   ABDOMINAL HYSTERECTOMY  1992   W/ BILATERAL SALPINGO-OOPHORECTOMY   CARPAL TUNNEL RELEASE     SHOULDER ARTHROSCOPY WITH ROTATOR CUFF REPAIR  08/06/2012   Procedure: SHOULDER ARTHROSCOPY WITH ROTATOR CUFF REPAIR;  Surgeon: Magnus Sinning, MD;  Location: [facility];  Service: Orthopedics;  Laterality: Right;   SHOULDER ARTHROSCOPY WITH SUBACROMIAL DECOMPRESSION  08/06/2012   Procedure: SHOULDER ARTHROSCOPY WITH SUBACROMIAL DECOMPRESSION;  Surgeon: Magnus Sinning, MD;  Location: [facility];  Service: Orthopedics;  Laterality: Right;  debridement of labral   TONSILLECTOMY  AS CHILD   Social History   Socioeconomic History   Marital status: Widowed    Spouse name: Not on file   Number of children: Not on file   Years of education: Not on file   Highest education level: Not on file  Occupational History   Occupation: retired    Fish farm manager: RETIRED  Tobacco Use   Smoking status: Never   Smokeless tobacco: Never  Substance and Sexual Activity   Alcohol use: Yes    Comment: OCCASIONAL   Drug use: No   Sexual activity: Not on file  Other Topics Concern   Not on file  Social History Narrative   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  Has smoke detector and wears seat belts.  No firearms. No excess sun exposure. Sees dentist regularly .   Dexa nl Oct 24, 2003 and 10-23-2006   Husband inhpsicde care for  Throat  Cancer  2017-10-23 passed away 08/22/2018   Social Determinants of Health   Financial Resource Strain: Low Risk  (04/30/2022)   Overall Financial Resource Strain (CARDIA)    Difficulty of Paying Living Expenses: Not hard at all  Food Insecurity: No Food Insecurity (  04/30/2022)   Hunger Vital Sign    Worried About Running Out of Food in the Last Year: Never true    Ran Out of Food in the Last Year: Never true  Transportation Needs: No Transportation Needs (04/30/2022)   PRAPARE - Hydrologist (Medical): No    Lack of Transportation (Non-Medical): No  Physical Activity: Sufficiently Active (04/30/2022)   Exercise Vital Sign    Days of Exercise per Week: 7 days    Minutes of Exercise per Session: 30 min  Stress: No Stress Concern Present (04/30/2022)   [place]    Feeling of Stress : Not at all  Social Connections: Moderately Integrated  (04/30/2022)   Social Connection and Isolation Panel [NHANES]    Frequency of Communication with Friends and Family: More than three times a week    Frequency of Social Gatherings with Friends and Family: More than three times a week    Attends Religious Services: More than 4 times per year    Active Member of [employer] or Organizations: Yes    Attends Archivist Meetings: More than 4 times per year    Marital Status: Widowed   Allergies  Allergen Reactions   Simvastatin Other (See Comments)    JOINT ACHES   Atorvastatin     Muscle aches   Family History  Problem Relation Age of Onset   Arthritis Sister         CPPD autoinmmune   Hyperlipidemia Other        family hx   Hypertension Other        family hx   Sudden death Other        family hx   Heart disease Other        family hx   Multiple myeloma Father        deceased   Stroke Mother 24     Current Outpatient Medications (Cardiovascular):    rosuvastatin (CRESTOR) 5 MG tablet, Take crestor 5 mg po 3 days per week  and then as directed .     Current Outpatient Medications (Other):    conjugated estrogens (PREMARIN) vaginal cream, Use vaginally as instructed twice a day   COVID-19 mRNA bivalent vaccine, Pfizer, (PFIZER COVID-19 VAC BIVALENT) injection, Inject into the muscle.   diazepam (VALIUM) 5 MG tablet, One tab by mouth, 2 hours before procedure.   PREMARIN vaginal cream, USE VAGINALLY AS INSTRUCTED   sertraline (ZOLOFT) 100 MG tablet, TAKE 1 TABLET BY MOUTH DAILY   VITAMIN D PO, Take by mouth.   Reviewed prior external information including notes and imaging from  primary care provider As well as notes that were available from care everywhere and other healthcare systems.  Past medical history, social, surgical and family history all reviewed in electronic medical record.  No pertanent information unless stated regarding to the chief complaint.   Review of Systems:  No headache, visual changes,  nausea, vomiting, diarrhea, constipation, dizziness, abdominal pain, skin rash, fevers, chills, night sweats, weight loss, swollen lymph nodes, body aches, joint swelling, chest pain, shortness of breath, mood changes. POSITIVE muscle aches  Objective  Blood pressure 120/80, pulse 66, height _0  (1.626 m), SpO2 97 %.   General: No apparent distress alert and oriented x3 mood and affect normal, dressed appropriately.  HEENT: Pupils equal, extraocular movements intact  Respiratory: Patient's speak in full sentences and does not appear short of breath  Cardiovascular: No lower extremity edema, non tender, no erythema  Right shoulder exam shows that patient does still have some mild weakness compared to the contralateral side.  Severe tenderness to palpation over the acromioclavicular joint.   Patient's right ankle also tender to palpation noted over the Achilles.  Some mild swelling it appears.   Limited muscular skeletal ultrasound was performed and interpreted by Hulan Saas, M  Limited ultrasound of patient's shoulder still show some hypoechoic changes at the acromioclavicular joint.  Still has some different pain changes noted over the rotator cuff that does seem some mild chronic tearing noted.  Still significantly improved from previous imaging Patient's clinical shows that there is a cortical irregularity noted at the calcaneal at the insertion of the Achilles.  Some hypoechoic changes of the deep fibers that is consistent with a partial tear.  Minimal retraction of some of the fibers. Impression: Interval improvement of the shoulder rotator cuff but does have acromioclavicular swelling Partial Achilles tear noted  Procedure: Real-time Ultrasound Guided Injection of right acromioclavicular joint Device: GE Logiq Q7 Ultrasound guided injection is preferred based studies that show increased duration, increased effect, greater accuracy, decreased procedural pain, increased response rate,  and decreased cost with ultrasound guided versus blind injection.  Verbal informed consent obtained.  Time-out conducted.  Noted no overlying erythema, induration, or other signs of local infection.  Skin prepped in a sterile fashion.  Local anesthesia: Topical Ethyl chloride.  With sterile technique and under real time ultrasound guidance: With a 25-gauge half inch needle injecting 0.5 cc of 0.5% Marcaine and 0.5 cc of Kenalog 40 mg/mL Completed without difficulty  Pain immediately resolved suggesting accurate placement of the medication.  Advised to call if fevers/chills, erythema, induration, drainage, or persistent bleeding.  Impression: Technically successful ultrasound guided injection.   Impression and Recommendations:     The above documentation has been reviewed and is accurate and complete Lyndal Pulley, DO

## 2022-07-24 ENCOUNTER — Ambulatory Visit: Payer: Medicare PPO | Admitting: Family Medicine

## 2022-07-24 ENCOUNTER — Ambulatory Visit: Payer: Self-pay

## 2022-07-24 VITALS — BP 120/80 | HR 66 | Ht 64.0 in

## 2022-07-24 DIAGNOSIS — S86011A Strain of right Achilles tendon, initial encounter: Secondary | ICD-10-CM

## 2022-07-24 DIAGNOSIS — M25571 Pain in right ankle and joints of right foot: Secondary | ICD-10-CM

## 2022-07-24 DIAGNOSIS — M19011 Primary osteoarthritis, right shoulder: Secondary | ICD-10-CM

## 2022-07-24 NOTE — Assessment & Plan Note (Signed)
Partial tear noted.  We discussed the calcific changes noted.  Discussed icing regimen and home exercises.  Discussed which activities to do and which ones to avoid.  Patient given a pneumatic brace noted.  Avoid being barefoot.  Follow-up again in 6 to 8 weeks

## 2022-07-24 NOTE — Assessment & Plan Note (Signed)
Patient still has some weakness of the rotator cuff.  We discussed with patient about the home exercises, icing regimen, which activities to do and which ones to avoid.  Patient did have improvement in strength after the injection which makes me hopeful that patient should continue to improve.  Follow-up with me again in 6 to 8 weeks.

## 2022-07-24 NOTE — Patient Instructions (Signed)
Good to see you Injection in right shoulder today  Exercises for the ankle given  Follow up in 6-8 weeks

## 2022-09-01 ENCOUNTER — Other Ambulatory Visit: Payer: Self-pay | Admitting: Internal Medicine

## 2022-09-03 DIAGNOSIS — D122 Benign neoplasm of ascending colon: Secondary | ICD-10-CM | POA: Diagnosis not present

## 2022-09-03 DIAGNOSIS — K5140 Inflammatory polyps of colon without complications: Secondary | ICD-10-CM | POA: Diagnosis not present

## 2022-09-03 DIAGNOSIS — D120 Benign neoplasm of cecum: Secondary | ICD-10-CM | POA: Diagnosis not present

## 2022-09-03 DIAGNOSIS — K635 Polyp of colon: Secondary | ICD-10-CM | POA: Diagnosis not present

## 2022-09-03 DIAGNOSIS — Z1211 Encounter for screening for malignant neoplasm of colon: Secondary | ICD-10-CM | POA: Diagnosis not present

## 2022-09-03 DIAGNOSIS — K5730 Diverticulosis of large intestine without perforation or abscess without bleeding: Secondary | ICD-10-CM | POA: Diagnosis not present

## 2022-09-03 DIAGNOSIS — D123 Benign neoplasm of transverse colon: Secondary | ICD-10-CM | POA: Diagnosis not present

## 2022-09-03 LAB — HM COLONOSCOPY

## 2022-09-09 ENCOUNTER — Ambulatory Visit
Admission: RE | Admit: 2022-09-09 | Discharge: 2022-09-09 | Disposition: A | Discharge status: Home / Self Care | Payer: Medicare PPO | Source: Ambulatory Visit | Attending: Internal Medicine | Admitting: Internal Medicine

## 2022-09-09 DIAGNOSIS — Z1231 Encounter for screening mammogram for malignant neoplasm of breast: Secondary | ICD-10-CM

## 2022-09-09 NOTE — Progress Notes (Signed)
[name] [place] Phone: [phone] Subjective:   Pamela [name], am serving as a scribe for Dr. Hulan Saas.  I'm seeing this patient by the request  of:  Panosh, Standley Brooking, MD  CC: Shoulder and Achilles follow-up  [id number]  07/24/2022 Partial tear noted. We discussed the calcific changes noted. Discussed icing regimen and home exercises. Discussed which activities to do and which ones to avoid. Patient given a pneumatic brace noted. Avoid being barefoot. Follow-up again in 6 to 8 weeks   Patient still has some weakness of the rotator cuff.  We discussed with patient about the home exercises, icing regimen, which activities to do and which ones to avoid.  Patient did have improvement in strength after the injection which makes me hopeful that patient should continue to improve.  Follow-up with me again in 6 to 8 weeks.      Update 09/11/2022 Pamela [name] is a 72 y.o. female coming in with complaint of R achilles and R shoulder pain. Patient states the achilles is still tender but better. Right shoulder is doing better, states that she is able to raise her arm up a little more than she could before.       Past Medical History:  Diagnosis Date   Arthritis KNEES, HANDS, SHOULDERS   Depression    Hx of Clostridium difficile infection    Hyperlipidemia, mixed    Impingement syndrome of right shoulder    Intestinal bacterial overgrowth 06/20/2013   Under rx per dr Collene Mares    Skin cancer    Tonia Brooms    Vaginitis, atrophic    recurrent   Past Surgical History:  Procedure Laterality Date   ABDOMINAL HYSTERECTOMY  1992   W/ BILATERAL SALPINGO-OOPHORECTOMY   CARPAL TUNNEL RELEASE     SHOULDER ARTHROSCOPY WITH ROTATOR CUFF REPAIR  2012-08-13   Procedure: SHOULDER ARTHROSCOPY WITH ROTATOR CUFF REPAIR;  Surgeon: Magnus Sinning, MD;  Location: [facility];  Service: Orthopedics;  Laterality:  Right;   SHOULDER ARTHROSCOPY WITH SUBACROMIAL DECOMPRESSION  2012-08-13   Procedure: SHOULDER ARTHROSCOPY WITH SUBACROMIAL DECOMPRESSION;  Surgeon: Magnus Sinning, MD;  Location: [facility];  Service: Orthopedics;  Laterality: Right;  debridement of labral   TONSILLECTOMY  AS CHILD   Social History   Socioeconomic History   Marital status: Widowed    Spouse name: Not on file   Number of children: Not on file   Years of education: Not on file   Highest education level: Not on file  Occupational History   Occupation: retired    Fish farm manager: RETIRED  Tobacco Use   Smoking status: Never   Smokeless tobacco: Never  Substance and Sexual Activity   Alcohol use: Yes    Comment: OCCASIONAL   Drug use: No   Sexual activity: Not on file  Other Topics Concern   Not on file  Social History Narrative   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  Has smoke detector and wears seat belts.  No firearms. No excess sun exposure. Sees dentist regularly .   Dexa nl 2003-11-12 and November 12, 2006   Husband inhpsicde care for  Throat  Cancer  Nov 11, 2017 passed away 2018/08/13   Social Determinants of Health   Financial Resource Strain: Low Risk  (  04/30/2022)   Overall Financial Resource Strain (CARDIA)    Difficulty of Paying Living Expenses: Not hard at all  Food Insecurity: No Food Insecurity (04/30/2022)   Hunger Vital Sign    Worried About Running Out of Food in the Last Year: Never true    Ran Out of Food in the Last Year: Never true  Transportation Needs: No Transportation Needs (04/30/2022)   PRAPARE - Hydrologist (Medical): No    Lack of Transportation (Non-Medical): No  Physical Activity: Sufficiently Active (04/30/2022)   Exercise Vital Sign    Days of Exercise per Week: 7 days    Minutes of Exercise per Session: 30 min  Stress: No Stress Concern Present (04/30/2022)   [place]    Feeling of Stress : Not at all  Social Connections: Moderately Integrated (04/30/2022)   Social Connection and Isolation Panel [NHANES]    Frequency of Communication with Friends and Family: More than three times a week    Frequency of Social Gatherings with Friends and Family: More than three times a week    Attends Religious Services: More than 4 times per year    Active Member of [employer] or Organizations: Yes    Attends Archivist Meetings: More than 4 times per year    Marital Status: Widowed   Allergies  Allergen Reactions   Simvastatin Other (See Comments)    JOINT ACHES   Atorvastatin     Muscle aches   Family History  Problem Relation Age of Onset   Arthritis Sister         CPPD autoinmmune   Hyperlipidemia Other        family hx   Hypertension Other        family hx   Sudden death Other        family hx   Heart disease Other        family hx   Multiple myeloma Father        deceased   Stroke Mother 35     Current Outpatient Medications (Cardiovascular):    rosuvastatin (CRESTOR) 5 MG tablet, Take crestor 5 mg po 3 days per week  and then as directed .     Current Outpatient Medications (Other):    conjugated estrogens (PREMARIN) vaginal cream, Use vaginally as instructed twice a day   diazepam (VALIUM) 5 MG tablet, One tab by mouth, 2 hours before procedure.   PREMARIN vaginal cream, USE VAGINALLY AS INSTRUCTED   sertraline (ZOLOFT) 100 MG tablet, TAKE 1 TABLET BY MOUTH DAILY   VITAMIN D PO, Take by mouth.   Reviewed prior external information including notes and imaging from  primary care provider As well as notes that were available from care everywhere and other healthcare systems.  Past medical history, social, surgical and family history all reviewed in electronic medical record.  No pertanent information unless stated regarding to the chief complaint.   Review of  Systems:  No headache, visual changes, nausea, vomiting, diarrhea, constipation, dizziness, abdominal pain, skin rash, fevers, chills, night sweats, weight loss, swollen lymph nodes, body aches, joint swelling, chest pain, shortness of breath, mood changes. POSITIVE muscle aches  Objective  Blood pressure 108/70, pulse 69, height '5\' 4"'$  (1.626 m), SpO2 98 %.   General: No apparent distress alert and oriented x3 mood and affect normal, dressed appropriately.  HEENT: Pupils equal, extraocular movements intact  Respiratory: Patient's speak in  full sentences and does not appear short of breath  Cardiovascular: No lower extremity edema, non tender, no erythema  Patient does have a mild antalgic gait favoring the right ankle.  Patient's right ankle minorly tender to palpation over the insertion of the Achilles.  No defect noted.  Negative Thompson test.  Neurovascularly intact distally Right shoulder exam still has some limited range of motion in some planes but does have improvement in range of motion from previous exam now.  Patient strength of the rotator cuff is 4 out of 5 which is better than previous exam as well.  Limited muscular skeletal ultrasound was performed and interpreted by Hulan Saas, M  Limited ultrasound of patient's ankle does show that there is still significant hypoechoic changes around the Achilles at the insertion as well as increasing in Doppler flow.  Some mild neovascularization noted but still not perfect. Impression: Continued irritation of the Achilles with likely intersubstance tearing but no focal tear    Impression and Recommendations:    The above documentation has been reviewed and is accurate and complete Lyndal Pulley, DO

## 2022-09-11 ENCOUNTER — Encounter: Payer: Self-pay | Admitting: Family Medicine

## 2022-09-11 ENCOUNTER — Ambulatory Visit: Payer: Self-pay

## 2022-09-11 ENCOUNTER — Ambulatory Visit: Payer: Medicare PPO | Admitting: Family Medicine

## 2022-09-11 VITALS — BP 108/70 | HR 69 | Ht 64.0 in

## 2022-09-11 DIAGNOSIS — M25571 Pain in right ankle and joints of right foot: Secondary | ICD-10-CM

## 2022-09-11 DIAGNOSIS — M12811 Other specific arthropathies, not elsewhere classified, right shoulder: Secondary | ICD-10-CM

## 2022-09-11 DIAGNOSIS — S86011A Strain of right Achilles tendon, initial encounter: Secondary | ICD-10-CM

## 2022-09-11 NOTE — Assessment & Plan Note (Signed)
Rotator cuff arthropathy but does have improvement in strength from previous exam which is remarkable.  Discussed with patient about icing regimen and home exercises otherwise.  Increase activity slowly.  Follow-up again in 6 to 8 weeks

## 2022-09-11 NOTE — Assessment & Plan Note (Signed)
Notices significant tearing noted today and is having some potentially increasing neovascularization.  Still has increasing Doppler flow that is significant for inflammation noted.  Patient is wearing the pneumatic compression sleeve which I do think is helpful, discussed potential heel lift, still avoid being barefoot.  Discussed icing regimen.  Declined formal physical therapy.  Follow-up again in 6 to 8 weeks

## 2022-09-11 NOTE — Patient Instructions (Signed)
Good to see you  Shoulder is doing great Can lift but keep hand in Periferal vision  Keep wearing ankle brace do exercises 2 times a week  Ice after activity  See me again in 7-8 weeks

## 2022-09-17 ENCOUNTER — Encounter: Payer: Medicare PPO | Admitting: Internal Medicine

## 2022-09-20 ENCOUNTER — Other Ambulatory Visit: Payer: Self-pay | Admitting: Internal Medicine

## 2022-09-22 ENCOUNTER — Ambulatory Visit (INDEPENDENT_AMBULATORY_CARE_PROVIDER_SITE_OTHER): Payer: Medicare PPO | Admitting: Family

## 2022-09-22 ENCOUNTER — Encounter: Payer: Self-pay | Admitting: Family

## 2022-09-22 VITALS — BP 120/70 | HR 64 | Temp 97.6°F | Ht 64.25 in | Wt 167.0 lb

## 2022-09-22 DIAGNOSIS — Z79899 Other long term (current) drug therapy: Secondary | ICD-10-CM | POA: Diagnosis not present

## 2022-09-22 DIAGNOSIS — M179 Osteoarthritis of knee, unspecified: Secondary | ICD-10-CM

## 2022-09-22 DIAGNOSIS — Z0000 Encounter for general adult medical examination without abnormal findings: Secondary | ICD-10-CM

## 2022-09-22 DIAGNOSIS — E782 Mixed hyperlipidemia: Secondary | ICD-10-CM | POA: Diagnosis not present

## 2022-09-22 LAB — LIPID PANEL
Cholesterol: 280 mg/dL — ABNORMAL HIGH (ref 0–200)
HDL: 71.5 mg/dL (ref >39.00)
LDL Cholesterol: 182 mg/dL — ABNORMAL HIGH (ref 0–99)
NonHDL: 208.29
Total CHOL/HDL Ratio: 4
Triglycerides: 129 mg/dL (ref 0.0–149.0)
VLDL: 25.8 mg/dL (ref 0.0–40.0)

## 2022-09-22 LAB — CBC WITH DIFFERENTIAL/PLATELET
Basophils Absolute: 0 10*3/uL (ref 0.0–0.1)
Basophils Relative: 0.9 % (ref 0.0–3.0)
Eosinophils Absolute: 0.1 10*3/uL (ref 0.0–0.7)
Eosinophils Relative: 1 % (ref 0.0–5.0)
HCT: 38.6 % (ref 36.0–46.0)
Hemoglobin: 13.1 g/dL (ref 12.0–15.0)
Lymphocytes Relative: 37.8 % (ref 12.0–46.0)
Lymphs Abs: 1.9 10*3/uL (ref 0.7–4.0)
MCHC: 34 g/dL (ref 30.0–36.0)
MCV: 80.9 fl (ref 78.0–100.0)
Monocytes Absolute: 0.4 10*3/uL (ref 0.1–1.0)
Monocytes Relative: 9.1 % (ref 3.0–12.0)
Neutro Abs: 2.5 10*3/uL (ref 1.4–7.7)
Neutrophils Relative %: 51.2 % (ref 43.0–77.0)
Platelets: 236 10*3/uL (ref 150.0–400.0)
RBC: 4.77 Mil/uL (ref 3.87–5.11)
RDW: 14.4 % (ref 11.5–15.5)
WBC: 5 10*3/uL (ref 4.0–10.5)

## 2022-09-22 LAB — HEPATIC FUNCTION PANEL
ALT: 13 U/L (ref 0–35)
AST: 17 U/L (ref 0–37)
Albumin: 4.6 g/dL (ref 3.5–5.2)
Alkaline Phosphatase: 60 U/L (ref 39–117)
Bilirubin, Direct: 0.1 mg/dL (ref 0.0–0.3)
Total Bilirubin: 0.5 mg/dL (ref 0.2–1.2)
Total Protein: 6.9 g/dL (ref 6.0–8.3)

## 2022-09-22 LAB — TSH: TSH: 2.15 u[IU]/mL (ref 0.35–5.50)

## 2022-09-22 LAB — BASIC METABOLIC PANEL
BUN: 22 mg/dL (ref 6–23)
CO2: 28 mEq/L (ref 19–32)
Calcium: 9 mg/dL (ref 8.4–10.5)
Chloride: 101 mEq/L (ref 96–112)
Creatinine, Ser: 0.86 mg/dL (ref 0.40–1.20)
GFR: 67.04 mL/min (ref >60.00)
Glucose, Bld: 86 mg/dL (ref 70–99)
Potassium: 4.1 mEq/L (ref 3.5–5.1)
Sodium: 138 mEq/L (ref 135–145)

## 2022-09-22 LAB — URIC ACID: Uric Acid, Serum: 6 mg/dL (ref 2.4–7.0)

## 2022-09-22 NOTE — Progress Notes (Signed)
Complete physical exam  Patient: Pamela [name]   DOB: February 22, 1951   72 y.o. Female  MRN: [id number]  Subjective:    Chief Complaint  Patient presents with  . Annual Exam    Pamela [name] is a 72 y.o. female who presents today for a complete physical exam. She reports consuming a general diet. She has gained weight. Gym/ health club routine includes yoga. She also walker her dog. She is a part of the [employer] program at the [facility]. She generally feels well. She reports sleeping well. She does not have additional problems to discuss today. Had a colonoscopy earlier this month that she will have to have repeated because the prep was not successful. They were able to visualize polyps so she will repeat her colonoscopy in 1 year. Mammogram done. She would like her uric acid checked today due to family history and joint pain.   She has stopped her crestor due to muscle aches. Patient is willing to resume at 2 times per week or try another option.  Most recent fall risk assessment:    09/22/2022    9:34 AM  [place] in the past year? 0  Number falls in past yr: 0  Injury with Fall? 0  Risk for fall due to : No Fall Risks  Follow up Falls evaluation completed     Most recent depression screenings:    09/22/2022    9:34 AM 04/30/2022   11:11 AM  PHQ 2/9 Scores  PHQ - 2 Score 0 0  PHQ- 9 Score 0         Patient Care Team: Panosh, Standley Brooking, MD as PCP - General Paralee Cancel, MD (Orthopedic Surgery) Juanita Craver, MD as Attending Physician (Gastroenterology) Paralee Cancel, MD as Consulting Physician (Orthopedic Surgery)   Outpatient Medications Prior to Visit  Medication Sig  . PREMARIN vaginal cream USE VAGINALLY AS INSTRUCTED  . sertraline (ZOLOFT) 100 MG tablet TAKE 1 TABLET BY MOUTH DAILY  . VITAMIN D PO Take by mouth.  . conjugated estrogens (PREMARIN) vaginal cream Use vaginally as instructed twice a day  . rosuvastatin (CRESTOR) 5 MG tablet Take  crestor 5 mg po 3 days per week  and then as directed . (Patient not taking: Reported on 09/22/2022)  . [DISCONTINUED] diazepam (VALIUM) 5 MG tablet One tab by mouth, 2 hours before procedure.   No facility-administered medications prior to visit.    Review of Systems  Musculoskeletal:  Positive for joint pain.  All other systems reviewed and are negative.  Past Medical History:  Diagnosis Date  . Arthritis KNEES, HANDS, SHOULDERS  . Depression   . Hx of Clostridium difficile infection   . Hyperlipidemia, mixed   . Impingement syndrome of right shoulder   . Intestinal bacterial overgrowth 06/20/2013   Under rx per dr Collene Mares   . Skin cancer    Tonia Brooms   . Vaginitis, atrophic    recurrent    Social History   Socioeconomic History  . Marital status: Widowed    Spouse name: Not on file  . Number of children: Not on file  . Years of education: Not on file  . Highest education level: Not on file  Occupational History  . Occupation: retired    Fish farm manager: RETIRED  Tobacco Use  . Smoking status: Never  . Smokeless tobacco: Never  Substance and Sexual Activity  . Alcohol use: Yes    Comment: OCCASIONAL  . Drug use: No  .  Sexual activity: Not on file  Other Topics Concern  . 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  Has smoke detector and wears seat belts.  No firearms. No excess sun exposure. Sees dentist regularly .   Dexa nl 2003-11-29 and Nov 29, 2006   Husband inhpsicde care for  Throat  Cancer  11/28/2017 passed away 2018-08-30   Social Determinants of Health   Financial Resource Strain: Low Risk  (04/30/2022)   Overall Financial Resource Strain (CARDIA)   . Difficulty of Paying Living Expenses: Not hard at all  Food Insecurity: No Food Insecurity (04/30/2022)   Hunger Vital Sign   . Worried About Charity fundraiser in the Last Year: Never true   . Ran Out  of Food in the Last Year: Never true  Transportation Needs: No Transportation Needs (04/30/2022)   PRAPARE - Transportation   . Lack of Transportation (Medical): No   . Lack of Transportation (Non-Medical): No  Physical Activity: Sufficiently Active (04/30/2022)   Exercise Vital Sign   . Days of Exercise per Week: 7 days   . Minutes of Exercise per Session: 30 min  Stress: No Stress Concern Present (04/30/2022)   [place]   . Feeling of Stress : Not at all  Social Connections: Moderately Integrated (04/30/2022)   Social Connection and Isolation Panel [NHANES]   . Frequency of Communication with Friends and Family: More than three times a week   . Frequency of Social Gatherings with Friends and Family: More than three times a week   . Attends Religious Services: More than 4 times per year   . Active Member of Clubs or Organizations: Yes   . Attends Archivist Meetings: More than 4 times per year   . Marital Status: Widowed  Intimate Partner Violence: Not At Risk (04/30/2022)   Humiliation, Afraid, Rape, and Kick questionnaire   . Fear of Current or Ex-Partner: No   . Emotionally Abused: No   . Physically Abused: No   . Sexually Abused: No    Past Surgical History:  Procedure Laterality Date  . ABDOMINAL HYSTERECTOMY  1992   W/ BILATERAL SALPINGO-OOPHORECTOMY  . CARPAL TUNNEL RELEASE    . SHOULDER ARTHROSCOPY WITH ROTATOR CUFF REPAIR  Aug 30, 2012   Procedure: SHOULDER ARTHROSCOPY WITH ROTATOR CUFF REPAIR;  Surgeon: Magnus Sinning, MD;  Location: [place];  Service: Orthopedics;  Laterality: Right;  . SHOULDER ARTHROSCOPY WITH SUBACROMIAL DECOMPRESSION  2012-08-30   Procedure: SHOULDER ARTHROSCOPY WITH SUBACROMIAL DECOMPRESSION;  Surgeon: Magnus Sinning, MD;  Location: [place];  Service: Orthopedics;  Laterality: Right;  debridement of labral  . TONSILLECTOMY  AS CHILD     Family History  Problem Relation Age of Onset  . Arthritis Sister         CPPD autoinmmune  . Hyperlipidemia Other        family hx  . Hypertension Other        family hx  . Sudden death Other        family hx  . Heart disease Other        family hx  . Multiple myeloma Father        deceased  . Stroke Mother 107  Allergies  Allergen Reactions  . Simvastatin Other (See Comments)    JOINT ACHES  . Atorvastatin     Muscle aches    Current Outpatient Medications on File Prior to Visit  Medication Sig Dispense Refill  . PREMARIN vaginal cream USE VAGINALLY AS INSTRUCTED 30 g 0  . sertraline (ZOLOFT) 100 MG tablet TAKE 1 TABLET BY MOUTH DAILY 90 tablet 0  . VITAMIN D PO Take by mouth.    . conjugated estrogens (PREMARIN) vaginal cream Use vaginally as instructed twice a day 42.5 g 5  . rosuvastatin (CRESTOR) 5 MG tablet Take crestor 5 mg po 3 days per week  and then as directed . (Patient not taking: Reported on 09/22/2022) 36 tablet 3   No current facility-administered medications on file prior to visit.    BP 120/70   Pulse 64   Temp 97.6 F (36.4 C) (Oral)   Ht 5' 4.25" (1.632 m)   Wt 167 lb (75.8 kg)   SpO2 98%   BMI 28.44 kg/m chart       Objective:     BP 120/70   Pulse 64   Temp 97.6 F (36.4 C) (Oral)   Ht 5' 4.25" (1.632 m)   Wt 167 lb (75.8 kg)   SpO2 98%   BMI 28.44 kg/m    Physical Exam Vitals and nursing note reviewed.  Constitutional:      Appearance: Normal appearance. She is obese.  HENT:     Head: Normocephalic and atraumatic.     Right Ear: Tympanic membrane, ear canal and external ear normal.     Left Ear: Tympanic membrane and external ear normal.     Nose: Nose normal.     Mouth/Throat:     Mouth: Mucous membranes are moist.     Pharynx: Oropharynx is clear.  Eyes:     Extraocular Movements: Extraocular movements intact.     Pupils: Pupils are equal, round, and reactive to light.  Cardiovascular:     Rate and Rhythm:  Normal rate and regular rhythm.  Pulmonary:     Effort: Pulmonary effort is normal.     Breath sounds: Normal breath sounds.  Abdominal:     General: Abdomen is flat. Bowel sounds are normal.     Palpations: Abdomen is soft.     Tenderness: There is no abdominal tenderness. There is no guarding or rebound.  Musculoskeletal:        General: Normal range of motion.     Cervical back: Normal range of motion and neck supple.  Skin:    General: Skin is warm and dry.  Neurological:     General: No focal deficit present.     Mental Status: She is alert and oriented to person, place, and time. Mental status is at baseline.  Psychiatric:        Mood and Affect: Mood normal.     No results found for any visits on 09/22/22.     Assessment & Plan:    Routine Health Maintenance and Physical Exam  Immunization History  Administered Date(s) Administered  . Fluad Quad(high Dose 65+) 06/15/2019, 05/31/2020, 06/03/2022  . Influenza Split 07/15/2011, 06/15/2012  . Influenza Whole 07/06/2007, 06/07/2008, 05/16/2009, 05/20/2010  . Influenza, High Dose Seasonal PF 06/26/2015, 06/27/2016, 06/15/2017, 06/01/2018  . Influenza,inj,Quad PF,6+ Mos 06/20/2013, 06/21/2014  . Influenza-Unspecified 07/08/2018, 06/23/2021  . PFIZER(Purple Top)SARS-COV-2 Vaccination 06/07/2020  . Pension scheme manager 85yr & up 06/17/2021  . Pneumococcal Conjugate-13 06/21/2014  .  Pneumococcal Polysaccharide-23 06/26/2015  . Td 07/06/2007, 01/12/2017  . Unspecified SARS-COV-2 Vaccination 09/30/2019, 10/21/2019  . Zoster Recombinat (Shingrix) 05/10/2018, 07/08/2018  . Zoster, Live 04/11/2010    Health Maintenance  Topic Date Due  . COVID-19 Vaccine (5 - 2023-24 season) 10/08/2022 (Originally 04/25/2022)  . Medicare Annual Wellness (AWV)  05/01/2023  . MAMMOGRAM  09/09/2024  . DTaP/Tdap/Td (3 - Tdap) 01/13/2027  . COLONOSCOPY (Pts 45-11yr Insurance coverage will need to be confirmed)  09/03/2032  .  Pneumonia Vaccine 74 Years old  Completed  . INFLUENZA VACCINE  Completed  . DEXA SCAN  Completed  . Hepatitis C Screening  Completed  . Zoster Vaccines- Shingrix  Completed  . HPV VACCINES  Aged Out    Discussed health benefits of physical activity, and encouraged her to engage in regular exercise appropriate for her age and condition.  Problem List Items Addressed This Visit     Medication management   Relevant Orders   Basic metabolic panel   CBC with Differential   Lipid panel   TSH   Hepatic Function Panel   HYPERLIPIDEMIA   Relevant Orders   Lipid panel   Hepatic Function Panel   Degenerative joint disease of knee   Relevant Orders   Uric Acid   Other Visit Diagnoses     Routine general medical examination at a health care facility    -  Primary   Relevant Orders   Basic metabolic panel   CBC with Differential   Lipid panel   TSH      Return in 6 months (on 03/23/2023). Will discuss labs upon return. Continue a healthy diet and exercise. Try to reduce the weight. Call with any questions or concerns. Repeat colonoscopy as discussed.     PKennyth Arnold FNP

## 2022-09-22 NOTE — Patient Instructions (Signed)
Fat and Cholesterol Restricted Eating Plan Eating a diet that limits fat and cholesterol may help lower your risk for heart disease and other conditions. Your body needs fat and cholesterol for basic functions, but eating too much of these things can be harmful to your health. Your health care provider may order lab tests to check your blood fat (lipid) and cholesterol levels. This helps your health care provider understand your risk for certain conditions and whether you need to make diet changes. Work with your health care provider or dietitian to make an eating plan that is right for you. Your plan includes: Limit your fat intake to ______% or less of your total calories a day. This is ______g of fat per day. Limit your saturated fat intake to ______% or less of your total calories a day. This is ______g of saturated fat per day. Limit the amount of cholesterol in your diet to less than _________mg a day. Eat ___________ g of fiber a day. What are tips for following this plan? General guidelines If you are overweight, work with your health care provider to lose weight safely. Losing just 5-10% of your body weight can improve your overall health and help prevent diseases such as diabetes and heart disease. Avoid: Foods with added sugar. Fried foods. Foods that contain partially hydrogenated oils, including stick margarine, some tub margarines, cookies, crackers, and other baked goods. If you drink alcohol: Limit how much you have to: 0-1 drink a day for women who are not pregnant. 0-2 drinks a day for men. Know how much alcohol is in a drink. In the [place] one drink equals one 12 oz bottle of beer (355 mL), one 5 oz glass of wine (148 mL), or one 1 oz glass of hard liquor (44 mL). Reading food labels Check food labels for: Trans fats or partially hydrogenated oils. Avoid foods that contain these. High amounts of saturated fat. Choose foods that are low in saturated fat (less than 2 g). The  amount of cholesterol in each serving. The amount of fiber in each serving. Choose foods with healthy fats, such as: Monounsaturated and polyunsaturated fats. These include olive and canola oil, flaxseeds, walnuts, almonds, and seeds. Omega-3 fats. These are found in foods such as salmon, mackerel, sardines, tuna, flaxseed oil, and ground flaxseeds. Choose grain products that have whole grains. Look for the word "whole" as the first word in the ingredient list. Cooking Cook foods using methods other than frying. Baking, boiling, grilling, and broiling are some healthy options. Eat more home-cooked food and less restaurant, buffet, and fast food. Avoid cooking using saturated fats. Animal sources of saturated fats include meats, butter, and cream. Plant sources of saturated fats include palm oil, palm kernel oil, and coconut oil. Meal planning  At meals, imagine dividing your plate into fourths: Fill one-half of your plate with vegetables, green salads, and fruit. Fill one-fourth of your plate with whole grains. Fill one-fourth of your plate with lean protein foods. Eat fish that is high in omega-3 fats at least two times a week. Eat more foods that contain fiber, such as whole grains, beans, apples, pears, berries, broccoli, carrots, peas, and barley. These foods help promote healthy cholesterol levels in the blood. What foods should I eat? Fruits All fresh, canned (in natural juice), or frozen fruits. Vegetables Fresh or frozen vegetables (raw, steamed, roasted, or grilled). Green salads. Grains Whole grains, such as whole wheat or whole grain breads, crackers, cereals, and pasta. Unsweetened oatmeal, bulgur,  barley, quinoa, or brown rice. Corn or whole wheat flour tortillas. Meats and other proteins Ground beef (85% or leaner), grass-fed beef, or beef trimmed of fat. Skinless chicken or [place]. Ground chicken or [place]. Pork trimmed of fat. All fish and seafood. Egg whites. Dried beans,  peas, or lentils. Unsalted nuts or seeds. Unsalted canned beans. Natural nut butters without added sugar and oil. Dairy Low-fat or nonfat dairy products, such as skim or 1% milk, 2% or reduced-fat cheeses, low-fat and fat-free ricotta or cottage cheese, or plain low-fat and nonfat yogurt. Fats and oils Tub margarine without trans fats. Light or reduced-fat mayonnaise and salad dressings. Avocado. Olive, canola, sesame, or safflower oils. The items listed above may not be a complete list of foods and beverages you can eat. Contact a dietitian for more information. What foods should I avoid? Fruits Canned fruit in heavy syrup. Fruit in cream or butter sauce. Fried fruit. Vegetables Vegetables cooked in cheese, cream, or butter sauce. Fried vegetables. Grains White bread. White pasta. White rice. Cornbread. Bagels, pastries, and croissants. Crackers and snack foods that contain trans fat and hydrogenated oils. Meats and other proteins Fatty cuts of meat. Ribs, chicken wings, bacon, sausage, bologna, salami, chitterlings, fatback, hot dogs, bratwurst, and packaged lunch meats. Liver and organ meats. Whole eggs and egg yolks. Chicken and [place] with skin. Fried meat. Dairy Whole or 2% milk, cream, half-and-half, and cream cheese. Whole milk cheeses. Whole-fat or sweetened yogurt. Full-fat cheeses. Nondairy creamers and whipped toppings. Processed cheese, cheese spreads, and cheese curds. Fats and oils Butter, stick margarine, lard, shortening, ghee, or bacon fat. Coconut, palm kernel, and palm oils. Beverages Alcohol. Sugar-sweetened drinks such as sodas, lemonade, and fruit drinks. Sweets and desserts Corn syrup, sugars, honey, and molasses. Candy. Jam and jelly. Syrup. Sweetened cereals. Cookies, pies, cakes, donuts, muffins, and ice cream. The items listed above may not be a complete list of foods and beverages you should avoid. Contact a dietitian for more information. Summary Your body needs  fat and cholesterol for basic functions. However, eating too much of these things can be harmful to your health. Work with your health care provider and dietitian to follow a diet that limits fat and cholesterol. Doing this may help lower your risk for heart disease and other conditions. Choose healthy fats, such as monounsaturated and polyunsaturated fats, and foods high in omega-3 fatty acids. Eat fiber-rich foods, such as whole grains, beans, peas, fruits, and vegetables. Limit or avoid alcohol, fried foods, and foods high in saturated fats, partially hydrogenated oils, and sugar. This information is not intended to replace advice given to you by your health care provider. Make sure you discuss any questions you have with your health care provider. Document Revised: 12/21/2020 Document Reviewed: 12/21/2020 Elsevier Patient Education  [place].

## 2022-09-23 ENCOUNTER — Other Ambulatory Visit: Payer: Self-pay

## 2022-09-23 ENCOUNTER — Other Ambulatory Visit: Payer: Self-pay | Admitting: Family

## 2022-09-23 ENCOUNTER — Encounter: Payer: Self-pay | Admitting: Internal Medicine

## 2022-09-23 DIAGNOSIS — E782 Mixed hyperlipidemia: Secondary | ICD-10-CM

## 2022-09-23 MED ORDER — ROSUVASTATIN CALCIUM 5 MG PO TABS: ORAL_TABLET | ORAL | 2 refills | Status: DC

## 2022-09-23 NOTE — Telephone Encounter (Signed)
Pt requesting refill of  rosuvastatin (CRESTOR) 5 MG tablet [facility] [id number] - [place], [place] Phone: [phone]  Fax: [phone]

## 2022-10-07 ENCOUNTER — Other Ambulatory Visit: Payer: Self-pay | Admitting: Internal Medicine

## 2022-10-29 NOTE — Progress Notes (Unsigned)
[name] [place] Phone: [phone] Subjective:   Rito Ehrlich, am serving as a scribe for Dr. Hulan Saas.  I'm seeing this patient by the request  of:  Panosh, Standley Brooking, MD  CC: Right shoulder and Achilles pain  [id number]  09/11/2022 Rotator cuff arthropathy but does have improvement in strength from previous exam which is remarkable.  Discussed with patient about icing regimen and home exercises otherwise.  Increase activity slowly.  Follow-up again in 6 to 8 weeks     Notices significant tearing noted today and is having some potentially increasing neovascularization.  Still has increasing Doppler flow that is significant for inflammation noted.  Patient is wearing the pneumatic compression sleeve which I do think is helpful, discussed potential heel lift, still avoid being barefoot.  Discussed icing regimen.  Declined formal physical therapy.  Follow-up again in 6 to 8 weeks      Update 10/30/2022 Pamela [name] is a 72 y.o. female coming in with complaint of R shoulder and R achilles pain. Patient states that the right shoulder is about the same as last visit. States the right ankle is still tender especially if she doesn't have the air cast on.       Past Medical History:  Diagnosis Date   Arthritis KNEES, HANDS, SHOULDERS   Depression    Hx of Clostridium difficile infection    Hyperlipidemia, mixed    Impingement syndrome of right shoulder    Intestinal bacterial overgrowth 06/20/2013   Under rx per dr Collene Mares    Skin cancer    Tonia Brooms    Vaginitis, atrophic    recurrent   Past Surgical History:  Procedure Laterality Date   ABDOMINAL HYSTERECTOMY  1992   W/ BILATERAL SALPINGO-OOPHORECTOMY   CARPAL TUNNEL RELEASE     SHOULDER ARTHROSCOPY WITH ROTATOR CUFF REPAIR  08/06/2012   Procedure: SHOULDER ARTHROSCOPY WITH ROTATOR CUFF REPAIR;  Surgeon: Magnus Sinning, MD;  Location: [facility];  Service: Orthopedics;  Laterality: Right;   SHOULDER ARTHROSCOPY WITH SUBACROMIAL DECOMPRESSION  08/06/2012   Procedure: SHOULDER ARTHROSCOPY WITH SUBACROMIAL DECOMPRESSION;  Surgeon: Magnus Sinning, MD;  Location: [facility];  Service: Orthopedics;  Laterality: Right;  debridement of labral   TONSILLECTOMY  AS CHILD   Social History   Socioeconomic History   Marital status: Widowed    Spouse name: Not on file   Number of children: Not on file   Years of education: Not on file   Highest education level: Not on file  Occupational History   Occupation: retired    Fish farm manager: RETIRED  Tobacco Use   Smoking status: Never   Smokeless tobacco: Never  Substance and Sexual Activity   Alcohol use: Yes    Comment: OCCASIONAL   Drug use: No   Sexual activity: Not on file  Other Topics Concern   Not on file  Social History Narrative   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  Has smoke detector and wears seat belts.  No firearms. No excess sun exposure. Sees dentist regularly .   Dexa nl 2003-11-29 and 11-29-06   Husband inhpsicde care for  Throat  Cancer  11-28-2017 passed away August 30, 2018   Social Determinants of Health   Financial Resource Strain:  Low Risk  (04/30/2022)   Overall Financial Resource Strain (CARDIA)    Difficulty of Paying Living Expenses: Not hard at all  Food Insecurity: No Food Insecurity (04/30/2022)   Hunger Vital Sign    Worried About Running Out of Food in the Last Year: Never true    Ran Out of Food in the Last Year: Never true  Transportation Needs: No Transportation Needs (04/30/2022)   PRAPARE - Hydrologist (Medical): No    Lack of Transportation (Non-Medical): No  Physical Activity: Sufficiently Active (04/30/2022)   Exercise Vital Sign    Days of Exercise per Week: 7 days    Minutes of Exercise per Session: 30 min   Stress: No Stress Concern Present (04/30/2022)   [place]    Feeling of Stress : Not at all  Social Connections: Moderately Integrated (04/30/2022)   Social Connection and Isolation Panel [NHANES]    Frequency of Communication with Friends and Family: More than three times a week    Frequency of Social Gatherings with Friends and Family: More than three times a week    Attends Religious Services: More than 4 times per year    Active Member of [employer] or Organizations: Yes    Attends Archivist Meetings: More than 4 times per year    Marital Status: Widowed   Allergies  Allergen Reactions   Simvastatin Other (See Comments)    JOINT ACHES   Atorvastatin     Muscle aches   Family History  Problem Relation Age of Onset   Arthritis Sister         CPPD autoinmmune   Hyperlipidemia Other        family hx   Hypertension Other        family hx   Sudden death Other        family hx   Heart disease Other        family hx   Multiple myeloma Father        deceased   Stroke Mother 53     Current Outpatient Medications (Cardiovascular):    rosuvastatin (CRESTOR) 5 MG tablet, Take crestor 5 mg po 3 days per week  and then as directed .     Current Outpatient Medications (Other):    conjugated estrogens (PREMARIN) vaginal cream, Use vaginally as instructed twice a day   PREMARIN vaginal cream, USE VAGINALLY AS INSTRUCTED   sertraline (ZOLOFT) 100 MG tablet, TAKE 1 TABLET BY MOUTH DAILY   VITAMIN D PO, Take by mouth.   Reviewed prior external information including notes and imaging from  primary care provider As well as notes that were available from care everywhere and other healthcare systems.  Past medical history, social, surgical and family history all reviewed in electronic medical record.  No pertanent information unless stated regarding to the chief complaint.   Review of Systems:  No headache,  visual changes, nausea, vomiting, diarrhea, constipation, dizziness, abdominal pain, skin rash, fevers, chills, night sweats, weight loss, swollen lymph nodes, body aches, joint swelling, chest pain, shortness of breath, mood changes. POSITIVE muscle aches  Objective  Blood pressure 110/60, pulse 67, height 5' 4.25" (1.632 m), SpO2 97 %.   General: No apparent distress alert and oriented x3 mood and affect normal, dressed appropriately.  HEENT: Pupils equal, extraocular movements intact  Respiratory: Patient's speak in full sentences and does not appear short of breath  Cardiovascular: No  lower extremity edema, non tender, no erythema  Right shoulder does still have some crepitus noted.  Some limited range of motion noted.  Rotator cuff strength though does appear 4+ out of 5 compared to full on the contralateral side  Right ankle still has some tenderness over the Achilles itself.  Very small Haglund nodule noted but more tender approximately 2 cm proximal to this.  Limited muscular skeletal ultrasound was performed and interpreted by Hulan Saas, M  Limited ultrasound of patient's Achilles still shows still some calcific changes noted and possible gouty deposits.  No intersubstance tearing noted at the moment though that does seem to be an improvement. Impression: Interval improvement    Impression and Recommendations:

## 2022-10-30 ENCOUNTER — Encounter: Payer: Self-pay | Admitting: Family Medicine

## 2022-10-30 ENCOUNTER — Ambulatory Visit: Payer: Medicare PPO | Admitting: Family Medicine

## 2022-10-30 ENCOUNTER — Ambulatory Visit: Payer: Self-pay

## 2022-10-30 VITALS — BP 110/60 | HR 67 | Ht 64.25 in

## 2022-10-30 DIAGNOSIS — M25511 Pain in right shoulder: Secondary | ICD-10-CM

## 2022-10-30 DIAGNOSIS — M19011 Primary osteoarthritis, right shoulder: Secondary | ICD-10-CM | POA: Diagnosis not present

## 2022-10-30 DIAGNOSIS — M25571 Pain in right ankle and joints of right foot: Secondary | ICD-10-CM

## 2022-10-30 DIAGNOSIS — M12811 Other specific arthropathies, not elsewhere classified, right shoulder: Secondary | ICD-10-CM

## 2022-10-30 DIAGNOSIS — S86011A Strain of right Achilles tendon, initial encounter: Secondary | ICD-10-CM | POA: Diagnosis not present

## 2022-10-30 NOTE — Patient Instructions (Signed)
Good to see you  Increase tart cherry '3600mg'$  nightly Do not need to wear the brace as frequently Follow up in 2-3 months or as needed

## 2022-10-30 NOTE — Assessment & Plan Note (Signed)
Has done relatively well overall.  Continuing to have some discomfort and pain though.  We discussed with patient about the possibility of other injections but patient is to hold at this time.  2 to 3 months

## 2022-10-30 NOTE — Assessment & Plan Note (Signed)
Also arthritic changes noted.  Discussed which activities to do and which ones to avoidIncrease activity slowly otherwise.  Follow-up again in 12 weeks

## 2022-10-30 NOTE — Assessment & Plan Note (Signed)
Significant improvement noted at this time.  Discussed icing regimen and home exercises, discussed which activities could be beneficial still and may not need the brace as much.  He is making progress though continue to do the home exercises and follow-up again in 2 to 3 months

## 2022-11-14 ENCOUNTER — Telehealth: Payer: Self-pay | Admitting: Internal Medicine

## 2022-11-14 ENCOUNTER — Other Ambulatory Visit: Payer: Self-pay | Admitting: Internal Medicine

## 2022-11-14 NOTE — Telephone Encounter (Signed)
[facility] at [facility] is calling and need clarification the frequency of the PREMARIN vaginal cream

## 2022-11-17 NOTE — Telephone Encounter (Signed)
Patient previously applied  BID.   Please advise

## 2022-11-18 NOTE — Telephone Encounter (Signed)
Called Pamela [name] spoke with Pamela [name] message given  concerning medication clarification

## 2022-12-26 ENCOUNTER — Telehealth: Payer: Self-pay

## 2022-12-26 MED ORDER — SERTRALINE HCL 100 MG PO TABS: 100.0000 mg | ORAL_TABLET | Freq: Every day | ORAL | 0 refills | Status: DC

## 2022-12-26 NOTE — Telephone Encounter (Signed)
Received Refill Authorization Request from [facility] on Zoloft 100mg .   Rx sent.

## 2023-01-01 ENCOUNTER — Ambulatory Visit: Payer: Medicare PPO | Admitting: Family Medicine

## 2023-01-23 ENCOUNTER — Encounter: Payer: Self-pay | Admitting: Internal Medicine

## 2023-01-23 DIAGNOSIS — E782 Mixed hyperlipidemia: Secondary | ICD-10-CM

## 2023-01-23 MED ORDER — ROSUVASTATIN CALCIUM 5 MG PO TABS: ORAL_TABLET | ORAL | 2 refills | Status: DC

## 2023-03-24 ENCOUNTER — Ambulatory Visit: Payer: Medicare PPO | Admitting: Internal Medicine

## 2023-03-26 ENCOUNTER — Other Ambulatory Visit: Payer: Self-pay | Admitting: Internal Medicine

## 2023-04-09 ENCOUNTER — Encounter (INDEPENDENT_AMBULATORY_CARE_PROVIDER_SITE_OTHER): Payer: Self-pay

## 2023-04-15 DIAGNOSIS — D2271 Melanocytic nevi of right lower limb, including hip: Secondary | ICD-10-CM | POA: Diagnosis not present

## 2023-04-15 DIAGNOSIS — D2261 Melanocytic nevi of right upper limb, including shoulder: Secondary | ICD-10-CM | POA: Diagnosis not present

## 2023-04-15 DIAGNOSIS — D225 Melanocytic nevi of trunk: Secondary | ICD-10-CM | POA: Diagnosis not present

## 2023-04-15 DIAGNOSIS — Z85828 Personal history of other malignant neoplasm of skin: Secondary | ICD-10-CM | POA: Diagnosis not present

## 2023-04-15 DIAGNOSIS — L578 Other skin changes due to chronic exposure to nonionizing radiation: Secondary | ICD-10-CM | POA: Diagnosis not present

## 2023-04-15 DIAGNOSIS — L719 Rosacea, unspecified: Secondary | ICD-10-CM | POA: Diagnosis not present

## 2023-04-15 DIAGNOSIS — L821 Other seborrheic keratosis: Secondary | ICD-10-CM | POA: Diagnosis not present

## 2023-04-15 DIAGNOSIS — L565 Disseminated superficial actinic porokeratosis (DSAP): Secondary | ICD-10-CM | POA: Diagnosis not present

## 2023-04-15 DIAGNOSIS — C44311 Basal cell carcinoma of skin of nose: Secondary | ICD-10-CM | POA: Diagnosis not present

## 2023-04-15 DIAGNOSIS — D485 Neoplasm of uncertain behavior of skin: Secondary | ICD-10-CM | POA: Diagnosis not present

## 2023-05-05 ENCOUNTER — Ambulatory Visit (INDEPENDENT_AMBULATORY_CARE_PROVIDER_SITE_OTHER): Payer: Medicare PPO | Admitting: Family Medicine

## 2023-05-05 VITALS — BP 136/67 | HR 60 | Wt 157.0 lb

## 2023-05-05 DIAGNOSIS — Z0000 Encounter for general adult medical examination without abnormal findings: Secondary | ICD-10-CM | POA: Diagnosis not present

## 2023-05-05 NOTE — Progress Notes (Signed)
PATIENT CHECK-IN and HEALTH RISK ASSESSMENT QUESTIONNAIRE:  -completed by phone/video for upcoming Medicare Preventive Visit  Pre-Visit Check-in: 1)Vitals (height, wt, BP, etc) - record in vitals section for visit on day of visit Request home vitals (wt, BP, etc.) and enter into vitals, THEN update Vital Signs SmartPhrase below at the top of the HPI. See below.  2)Review and Update Medications, Allergies PMH, Surgeries, Social history in Epic 3)Hospitalizations in the last year with date/reason? No   4)Review and Update Care Team (patient's specialists) in Epic 5) Complete PHQ9 in Epic  6) Complete Fall Screening in Epic 7)Review all Health Maintenance Due and order under PCP if not done.  8)Medicare Wellness Questionnaire: Answer theses question about your habits: Do you drink alcohol? Yes  If yes, how many drinks do you have a day?4-5 /week  Have you ever smoked? No   Do you use smokeless tobacco? No  Do you use an illicit drugs?no  Do you exercises?  Yes IF so, what type and how many days/minutes per week?walks dog daily 5 days, and [facility] is [facility] Are you sexually active?  No Number of partners?  Typical breakfast: cereal and almond  Typical lunch sandwich or salad  Typical dinner protein and veg, varies  Typical snacks:fruit, almonds sometime popcorn   Beverages: coffee, water, sparkling water, sometime a sip of coke   Answer theses question about you: Can you perform most household chores?yes  Do you find it hard to follow a conversation in a noisy room? No  Do you often ask people to speak up or repeat themselves? No  Do you feel that you have a problem with memory?no  Do you balance your checkbook and or bank acounts?yes Do you feel safe at home? yes Last dentist visit? 2 weeks ago  Do you need assistance with any of the following: Please note if so no   Driving?  Feeding yourself?  Getting from bed to chair?  Getting  to the toilet?  Bathing or showering?  Dressing yourself?  Managing money?  Climbing a flight of stairs  Preparing meals?  Do you have Advanced Directives in place (Living Will, Healthcare Power or Attorney)?  Yes    Last eye Exam and location? Dec 23   Do you currently use prescribed or non-prescribed narcotic or opioid pain medications? No   Do you have a history or close family history of breast, ovarian, tubal or peritoneal cancer or a family member with BRCA (breast cancer susceptibility 1 and 2) gene mutations?no   States BP and temp are always normal. Request home vitals (wt, BP, etc.) and enter into vitals, THEN update Vital Signs SmartPhrase below at the top of the HPI. See below.   Nurse/Assistant Credentials/time stamp: Tora Perches -CMA 10:25  am    ----------------------------------------------------------------------------------------------------------------------------------------------------------------------------------------------------------------------  Vital Signs: Vital signs are patient reported.   MEDICARE ANNUAL PREVENTIVE VISIT WITH PROVIDER: (Welcome to Medicare, initial annual wellness or annual wellness exam)  Virtual Visit via Video Note  I connected with [facility] on 05/05/23 by a video enabled telemedicine application and verified that I am speaking with the correct person using two identifiers.  Location patient: home Location provider:work or home office Persons participating in the virtual visit: patient, provider  Concerns and/or follow up today: stable, reports chronic issues of a little arthritis and ibs-c are stable issues.    See HM section in Epic for other details of completed  HM.    ROS: negative for report of fevers, unintentional weight loss, vision changes, vision loss, hearing loss or change, chest pain, sob, hemoptysis, melena, hematochezia, hematuria, falls, bleeding or bruising, thoughts of suicide or self harm, memory  loss  Patient-completed extensive health risk assessment - reviewed and discussed with the patient: See Health Risk Assessment completed with patient prior to the visit either above or in recent phone note. This was reviewed in detailed with the patient today and appropriate recommendations, orders and referrals were placed as needed per Summary below and patient instructions.   Review of Medical History: -PMH, PSH, Family History and current specialty and care providers reviewed and updated and listed below   Patient Care Team: Panosh, Neta Mends, MD as PCP - General Durene Romans, MD (Orthopedic Surgery) Charna Elizabeth, MD as Attending Physician (Gastroenterology) Durene Romans, MD as Consulting Physician (Orthopedic Surgery)   Past Medical History:  Diagnosis Date   Arthritis KNEES, HANDS, SHOULDERS   Depression    Hx of Clostridium difficile infection    Hyperlipidemia, mixed    Impingement syndrome of right shoulder    Intestinal bacterial overgrowth 06/20/2013   Under rx per dr Loreta Ave    Skin cancer    Danella Deis    Vaginitis, atrophic    recurrent    Past Surgical History:  Procedure Laterality Date   ABDOMINAL HYSTERECTOMY  1992   W/ BILATERAL SALPINGO-OOPHORECTOMY   CARPAL TUNNEL RELEASE     SHOULDER ARTHROSCOPY WITH ROTATOR CUFF REPAIR  08/06/2012   Procedure: SHOULDER ARTHROSCOPY WITH ROTATOR CUFF REPAIR;  Surgeon: Drucilla Schmidt, MD;  Location: [facility];  Service: Orthopedics;  Laterality: Right;   SHOULDER ARTHROSCOPY WITH SUBACROMIAL DECOMPRESSION  08/06/2012   Procedure: SHOULDER ARTHROSCOPY WITH SUBACROMIAL DECOMPRESSION;  Surgeon: Drucilla Schmidt, MD;  Location: [facility];  Service: Orthopedics;  Laterality: Right;  debridement of labral   TONSILLECTOMY  AS CHILD    Social History   Socioeconomic History   Marital status: Widowed    Spouse name: Not on file   Number of children: Not on file   Years of education: Not on file    Highest education level: Not on file  Occupational History   Occupation: retired    Associate Professor: RETIRED  Tobacco Use   Smoking status: Never   Smokeless tobacco: Never  Substance and Sexual Activity   Alcohol use: Yes    Comment: OCCASIONAL   Drug use: No   Sexual activity: Not on file  Other Topics Concern   Not on file  Social History Narrative   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  Has smoke detector and wears seat belts.  No firearms. No excess sun exposure. Sees dentist regularly .   Dexa nl 2004-05-30 and 05/31/07   Husband inhpsicde care for  Throat  Cancer  05-30-2018 passed away August 29, 2018  Social Determinants of Health   Financial Resource Strain: Low Risk  (04/30/2022)   Overall Financial Resource Strain (CARDIA)    Difficulty of Paying Living Expenses: Not hard at all  Food Insecurity: No Food Insecurity (04/30/2022)   Hunger Vital Sign    Worried About Running Out of Food in the Last Year: Never true    Ran Out of Food in the Last Year: Never true  Transportation Needs:  No Transportation Needs (04/30/2022)   PRAPARE - Administrator, Civil Service (Medical): No    Lack of Transportation (Non-Medical): No  Physical Activity: Sufficiently Active (04/30/2022)   Exercise Vital Sign    Days of Exercise per Week: 7 days    Minutes of Exercise per Session: 30 min  Stress: No Stress Concern Present (04/30/2022)   [employer] of Occupational Health - Occupational Stress Questionnaire    Feeling of Stress : Not at all  Social Connections: Moderately Integrated (04/30/2022)   Social Connection and Isolation Panel [NHANES]    Frequency of Communication with Friends and Family: More than three times a week    Frequency of Social Gatherings with Friends and Family: More than three times a week    Attends Religious Services: More than 4 times per year    Active Member of [employer] or  Organizations: Yes    Attends Banker Meetings: More than 4 times per year    Marital Status: Widowed  Intimate Partner Violence: Not At Risk (04/30/2022)   Humiliation, Afraid, Rape, and Kick questionnaire    Fear of Current or Ex-Partner: No    Emotionally Abused: No    Physically Abused: No    Sexually Abused: No    Family History  Problem Relation Age of Onset   Arthritis Sister         CPPD [employer]   Hyperlipidemia Other        family hx   Hypertension Other        family hx   Sudden death Other        family hx   Heart disease Other        family hx   Multiple myeloma Father        deceased   Stroke Mother 26    Current Outpatient Medications on File Prior to Visit  Medication Sig Dispense Refill   PREMARIN vaginal cream USE VAGINALLY AS INSTRUCTED. 30 g 0   rosuvastatin (CRESTOR) 5 MG tablet Take crestor 5 mg po 3 days per week  and then as directed . (Patient taking differently: Take 5 mg by mouth. Take crestor 5 mg po 2 days per week  and then as directed .) 36 tablet 2   sertraline (ZOLOFT) 100 MG tablet TAKE 1 TABLET BY MOUTH DAILY 90 tablet 0   VITAMIN D PO Take by mouth.     conjugated estrogens (PREMARIN) vaginal cream Use vaginally as instructed twice a day 42.5 g 5   No current facility-administered medications on file prior to visit.    Allergies  Allergen Reactions   Simvastatin Other (See Comments)    JOINT ACHES   Atorvastatin     Muscle aches       Physical Exam Vitals requested from patient and listed below if patient had equipment and was able to obtain at home for this virtual visit: Vitals:   05/05/23 1019  BP: 136/67  Pulse: 60   Estimated body mass index is 26.74 kg/m as calculated from the following:   Height as of 10/30/22: 5' 4.25" (1.632 m).   Weight as of this encounter: 157 lb (71.2 kg).  EKG (optional): deferred due to virtual visit  GENERAL: alert, oriented, no acute distress detected, full vision exam  deferred due to pandemic and/or virtual encounter   HEENT: atraumatic, conjunttiva clear, no obvious abnormalities on inspection of external nose and ears  NECK: normal movements of the head and neck  LUNGS: on inspection no signs of respiratory distress, breathing rate appears normal, no obvious gross SOB, gasping or wheezing  CV: no obvious cyanosis  MS: moves all visible extremities without noticeable abnormality  PSYCH/NEURO: pleasant and cooperative, no obvious depression or anxiety, speech and thought processing grossly intact, Cognitive function grossly intact  Flowsheet Row Office Visit from [facility]/22/2022 in [facility]  PHQ-9 Total Score 0           05/05/2023   10:42 AM 09/22/2022    9:34 AM 04/30/2022   11:11 AM 09/16/2021   10:36 AM 04/10/2021   10:58 AM  Depression screen PHQ 2/9  Decreased Interest 1 0 0 1 1  Down, Depressed, Hopeless 0 0 0 1 0  PHQ - 2 Score 1 0 0 2 1  Altered sleeping  0  2   Tired, decreased energy  0  2   Change in appetite  0  1   Feeling bad or failure about yourself   0  0   Trouble concentrating  0  2   Moving slowly or fidgety/restless  0  0   Suicidal thoughts  0  0   PHQ-9 Score  0  9   Difficult doing work/chores  Not difficult at all     Reports on medication, mild symptoms occasionally related possibly to loss of spouse 5 years ago. Feels is stable.      04/10/2021   11:00 AM 09/16/2021   10:36 AM 04/30/2022   11:14 AM 09/22/2022    9:34 AM 05/05/2023   10:03 AM  Fall Risk  Falls in the past year? 0 0 0 0 0  Was there an injury with Fall? 0  0 0 0  Fall Risk Category Calculator 0  0 0 0  Fall Risk Category (Retired) Low  Low    (RETIRED) Patient Fall Risk Level   Low fall risk    Patient at Risk for Falls Due to Impaired vision  No Fall Risks No Fall Risks   Fall risk Follow up Falls prevention discussed   Falls evaluation completed      SUMMARY AND PLAN:  Encounter for Medicare annual  wellness exam   Discussed applicable health maintenance/preventive health measures and advised and referred or ordered per patient preferences: -discussed vaccines due and current recommendations, she plans to get the updated covid and flu shots at the pharmacy, advised to let us know once she does so that we can update her record -she had her bone density test in 2023  Health Maintenance  Topic Date Due   INFLUENZA VACCINE  03/26/2023   COVID-19 Vaccine (5 - 2023-24 season) 04/26/2023   Medicare Annual Wellness (AWV)  05/04/2024   MAMMOGRAM  09/09/2024   DTaP/Tdap/Td (3 - Tdap) 01/13/2027   Colonoscopy  09/03/2032   Pneumonia Vaccine 70+ Years old  Completed   DEXA SCAN  Completed   Hepatitis C Screening  Completed   Zoster Vaccines- Shingrix  Completed   HPV VACCINES  Aged [employer] and counseling on the following was provided based on the above review of health and a plan/checklist for the patient, along with additional information discussed, was provided for the patient in the patient instructions :  -discussed BP goals, lifestyle measures to treat and advised to monitor at home a few times a week for the next 1-2 weeks and schedule in office visit if running above goal.  -Provided counseling and  plan for fall prevention. Provided safe balance exercises that can be done at home to improve balance and discussed exercise guidelines for adults with include balance exercises at least 3 days per week - see patient instructions. -Advised and counseled on a healthy lifestyle - including the importance of a healthy diet, regular physical activity, social connections and stress management. -Reviewed patient's current diet. Advised and counseled on a whole foods based healthy diet. A summary of a healthy diet was provided in the Patient Instructions. Discussed consuming foods rich in magnesium, half the plate whole fruts/veggies minimally processed and avoidance of highly processed  food/beverage. Discussed < 1500mg  per day sodium to assist with BP, CV health.  -reviewed patient's current physical activity level and discussed exercise guidelines for adults. Discussed community resources and ideas for safe exercise at home to assist in meeting exercise guideline recommendations in a safe and healthy way.  -Advise yearly dental visits at minimum and regular eye exams -Advised  no more than 1 alcoholic beverage daily.   Follow up: see patient instructions     Patient Instructions  I really enjoyed getting to talk with you today! I am available on Tuesdays and Thursdays for virtual visits if you have any questions or concerns, or if I can be of any further assistance.   CHECKLIST FROM ANNUAL WELLNESS VISIT:  -Follow up (please call to schedule if not scheduled after visit):   -yearly for annual wellness visit with primary care office  Here is a list of your preventive care/health maintenance measures and the plan for each if any are due:  PLAN For any measures below that may be due:  -can get the updated flu and covid vaccines at the pharmacy - please let us know when you do so that we can update your chart.  Health Maintenance  Topic Date Due   INFLUENZA VACCINE  03/26/2023   COVID-19 Vaccine (5 - 2023-24 season) 04/26/2023   Medicare Annual Wellness (AWV)  05/04/2024   MAMMOGRAM  09/09/2024   DTaP/Tdap/Td (3 - Tdap) 01/13/2027   Colonoscopy  09/03/2032   Pneumonia Vaccine 86+ Years old  Completed   DEXA SCAN  Completed   Hepatitis C Screening  Completed   Zoster Vaccines- Shingrix  Completed   HPV VACCINES  Aged Out    -See a dentist at least yearly  -Get your eyes checked and then per your eye specialist's recommendations  -Other issues addressed today:  -monitor blood pressure a few times per week. Goal is 120/70. If running higher on average, please schedule in office visit and bring cuff/log. -please do not consume more than one alcoholic beverage  per 24 hour period -see dietary recommendations below -see exercise recommendations below  -I have included below further information regarding a healthy whole foods based diet, physical activity guidelines for adults, stress management and opportunities for social connections. I hope you find this information useful.   -----------------------------------------------------------------------------------------------------------------------------------------------------------------------------------------------------------------------------------------------------------  NUTRITION: -eat real food: lots of colorful vegetables (half the plate) and fruits -5-7 servings of vegetables and fruits per day (fresh or steamed is best), exp. 2 servings of vegetables with lunch and dinner and 2 servings of fruit per day. Berries and greens such as kale and collards are great choices.  -consume on a regular basis: whole grains (make sure first ingredient on label contains the word "whole"), fresh fruits, fish, nuts, seeds, healthy oils (such as olive oil, avocado oil, grape seed oil) -may eat small amounts of dairy and lean meat on  occasion, but avoid processed meats such as ham, bacon, lunch meat, etc. -drink water -try to avoid fast food and pre-packaged foods, processed meat -most experts advise limiting sodium to < 2300mg  per day, should limit further is any chronic conditions such as high blood pressure, heart disease, diabetes, etc. The American Heart Association advised that < 1500mg  is is ideal -try to avoid foods that contain any ingredients with names you do not recognize  -try to avoid sugar/sweets (except for the natural sugar that occurs in fresh fruit) -try to avoid sweet drinks -try to avoid white rice, white bread, pasta (unless whole grain), white or yellow potatoes  EXERCISE GUIDELINES FOR ADULTS: -if you wish to increase your physical activity, do so gradually and with the approval of your  doctor -STOP and seek medical care immediately if you have any chest pain, chest discomfort or trouble breathing when starting or increasing exercise  -move and stretch your body, legs, feet and arms when sitting for long periods -Physical activity guidelines for optimal health in adults: -least 150 minutes per week of aerobic exercise (can talk, but not sing) once approved by your doctor, 20-30 minutes of sustained activity or two 10 minute episodes of sustained activity every day.  -resistance training at least 2 days per week if approved by your doctor -balance exercises 3+ days per week:   Stand somewhere where you have something sturdy to hold onto if you lose balance.    1) lift up on toes, start with 5x per day and work up to 20x   2) stand and lift on leg straight out to the side so that foot is a few inches of the floor, start with 5x each side and work up to 20x each side   3) stand on one foot, start with 5 seconds each side and work up to 20 seconds on each side  If you need ideas or help with getting more active:  -Silver sneakers https://tools.silversneakers.com  -Walk with a Doc: http://www.duncan-williams.com/  -try to include resistance (weight lifting/strength building) and balance exercises twice per week: or the following link for ideas: http://castillo-powell.com/  BuyDucts.dk  STRESS MANAGEMENT: -can try meditating, or just sitting quietly with deep breathing while intentionally relaxing all parts of your body for 5 minutes daily -if you need further help with stress, anxiety or depression please follow up with your primary doctor or contact the wonderful folks at [facility]: [phone]  SOCIAL CONNECTIONS: -options in [place] if you wish to engage in more social and exercise related activities:  -Silver sneakers https://tools.silversneakers.com  -Walk with a  Doc: http://www.duncan-williams.com/  -Check out the [facility] Active Adults 50+ section on the [place] [employer] (hiking clubs, book clubs, cards and games, chess, exercise classes, aquatic classes and much more) - see the website for details: https://www[place].gov/departments/parks-recreation/active-adults50  -YouTube has lots of exercise videos for different ages and abilities as well  -[facility] (a variety of indoor and outdoor inperson activities for adults). ([phone]) [phone]. [address].  -Virtual Online Classes (a variety of topics): see seniorplanet.org or call 269-670-1311  -consider volunteering at a school, hospice center, church, senior center or elsewhere           Terressa Koyanagi, DO

## 2023-05-05 NOTE — Patient Instructions (Signed)
I really enjoyed getting to talk with you today! I am available on Tuesdays and Thursdays for virtual visits if you have any questions or concerns, or if I can be of any further assistance.   CHECKLIST FROM ANNUAL WELLNESS VISIT:  -Follow up (please call to schedule if not scheduled after visit):   -yearly for annual wellness visit with primary care office  Here is a list of your preventive care/health maintenance measures and the plan for each if any are due:  PLAN For any measures below that may be due:  -can get the updated flu and covid vaccines at the pharmacy - please let us know when you do so that we can update your chart.  Health Maintenance  Topic Date Due   INFLUENZA VACCINE  03/26/2023   COVID-19 Vaccine (5 - 2023-24 season) 04/26/2023   Medicare Annual Wellness (AWV)  05/04/2024   MAMMOGRAM  09/09/2024   DTaP/Tdap/Td (3 - Tdap) 01/13/2027   Colonoscopy  09/03/2032   Pneumonia Vaccine 87+ Years old  Completed   DEXA SCAN  Completed   Hepatitis C Screening  Completed   Zoster Vaccines- Shingrix  Completed   HPV VACCINES  Aged Out    -See a dentist at least yearly  -Get your eyes checked and then per your eye specialist's recommendations  -Other issues addressed today:  -monitor blood pressure a few times per week. Goal is 120/70. If running higher on average, please schedule in office visit and bring cuff/log. -please do not consume more than one alcoholic beverage per 24 hour period -see dietary recommendations below -see exercise recommendations below  -I have included below further information regarding a healthy whole foods based diet, physical activity guidelines for adults, stress management and opportunities for social connections. I hope you find this information useful.    -----------------------------------------------------------------------------------------------------------------------------------------------------------------------------------------------------------------------------------------------------------  NUTRITION: -eat real food: lots of colorful vegetables (half the plate) and fruits -5-7 servings of vegetables and fruits per day (fresh or steamed is best), exp. 2 servings of vegetables with lunch and dinner and 2 servings of fruit per day. Berries and greens such as kale and collards are great choices.  -consume on a regular basis: whole grains (make sure first ingredient on label contains the word "whole"), fresh fruits, fish, nuts, seeds, healthy oils (such as olive oil, avocado oil, grape seed oil) -may eat small amounts of dairy and lean meat on occasion, but avoid processed meats such as ham, bacon, lunch meat, etc. -drink water -try to avoid fast food and pre-packaged foods, processed meat -most experts advise limiting sodium to < 2300mg  per day, should limit further is any chronic conditions such as high blood pressure, heart disease, diabetes, etc. The American Heart Association advised that < 1500mg  is is ideal -try to avoid foods that contain any ingredients with names you do not recognize  -try to avoid sugar/sweets (except for the natural sugar that occurs in fresh fruit) -try to avoid sweet drinks -try to avoid white rice, white bread, pasta (unless whole grain), white or yellow potatoes  EXERCISE GUIDELINES FOR ADULTS: -if you wish to increase your physical activity, do so gradually and with the approval of your doctor -STOP and seek medical care immediately if you have any chest pain, chest discomfort or trouble breathing when starting or increasing exercise  -move and stretch your body, legs, feet and arms when sitting for long periods -Physical activity guidelines for optimal health in adults: -least 150 minutes per week of  aerobic exercise (can  talk, but not sing) once approved by your doctor, 20-30 minutes of sustained activity or two 10 minute episodes of sustained activity every day.  -resistance training at least 2 days per week if approved by your doctor -balance exercises 3+ days per week:   Stand somewhere where you have something sturdy to hold onto if you lose balance.    1) lift up on toes, start with 5x per day and work up to 20x   2) stand and lift on leg straight out to the side so that foot is a few inches of the floor, start with 5x each side and work up to 20x each side   3) stand on one foot, start with 5 seconds each side and work up to 20 seconds on each side  If you need ideas or help with getting more active:  -Silver sneakers https://tools.silversneakers.com  -Walk with a Doc: http://www.duncan-williams.com/  -try to include resistance (weight lifting/strength building) and balance exercises twice per week: or the following link for ideas: http://castillo-powell.com/  BuyDucts.dk  STRESS MANAGEMENT: -can try meditating, or just sitting quietly with deep breathing while intentionally relaxing all parts of your body for 5 minutes daily -if you need further help with stress, anxiety or depression please follow up with your primary doctor or contact the wonderful folks at [facility]: [phone]  SOCIAL CONNECTIONS: -options in [place] if you wish to engage in more social and exercise related activities:  -Silver sneakers https://tools.silversneakers.com  -Walk with a Doc: http://www.duncan-williams.com/  -Check out the [facility] Active Adults 50+ section on the [place] of [employer] (hiking clubs, book clubs, cards and games, chess, exercise classes, aquatic classes and much more) - see the website for  details: https://www.[place].gov/departments/parks-recreation/active-adults50  -YouTube has lots of exercise videos for different ages and abilities as well  -[facility] (a variety of indoor and outdoor inperson activities for adults). [phone]. [address].  -Virtual Online Classes (a variety of topics): see seniorplanet.org or call ([phone]  -consider volunteering at a school, hospice center, church, senior center or elsewhere

## 2023-05-28 DIAGNOSIS — C44311 Basal cell carcinoma of skin of nose: Secondary | ICD-10-CM | POA: Diagnosis not present

## 2023-06-05 ENCOUNTER — Other Ambulatory Visit: Payer: Self-pay | Admitting: Internal Medicine

## 2023-06-07 MED ORDER — PREMARIN 0.625 MG/GM VA CREA: TOPICAL_CREAM | VAGINAL | 0 refills | Status: DC

## 2023-06-08 NOTE — Telephone Encounter (Signed)
Pharmacy calling to request the frequency be added to script.   [facility] [id number] - [place], [place] - [address] Phone: [phone]  Fax: [phone]

## 2023-06-12 ENCOUNTER — Encounter: Payer: Self-pay | Admitting: Internal Medicine

## 2023-06-12 MED ORDER — PREMARIN 0.625 MG/GM VA CREA: TOPICAL_CREAM | VAGINAL | 1 refills | Status: DC

## 2023-06-12 NOTE — Telephone Encounter (Signed)
Pharmacy called requesting frequency for conjugated estrogens (PREMARIN) vaginal cream   [facility] [id number] [place], [place] - [address] Phone: [phone]  Fax: [phone]

## 2023-06-15 NOTE — Telephone Encounter (Signed)
Contacted pharmacy and spoke to [facility]. Update her of provider's approval. She states she will add it to pt's medication.

## 2023-07-09 ENCOUNTER — Other Ambulatory Visit: Payer: Self-pay | Admitting: Internal Medicine

## 2023-07-09 ENCOUNTER — Encounter: Payer: Self-pay | Admitting: Internal Medicine

## 2023-07-13 NOTE — Telephone Encounter (Signed)
Yes please  change the sig  and refill med  ( nt sure  cost will change  but may be will last longer?)

## 2023-07-15 MED ORDER — ESTROGENS CONJUGATED 0.625 MG/GM VA CREA: TOPICAL_CREAM | VAGINAL | 5 refills | Status: DC

## 2023-07-20 DIAGNOSIS — H524 Presbyopia: Secondary | ICD-10-CM | POA: Diagnosis not present

## 2023-07-20 DIAGNOSIS — H5203 Hypermetropia, bilateral: Secondary | ICD-10-CM | POA: Diagnosis not present

## 2023-07-20 DIAGNOSIS — H2513 Age-related nuclear cataract, bilateral: Secondary | ICD-10-CM | POA: Diagnosis not present

## 2023-08-07 ENCOUNTER — Other Ambulatory Visit: Payer: Self-pay | Admitting: Internal Medicine

## 2023-08-07 DIAGNOSIS — Z1231 Encounter for screening mammogram for malignant neoplasm of breast: Secondary | ICD-10-CM

## 2023-09-11 ENCOUNTER — Ambulatory Visit
Admission: RE | Admit: 2023-09-11 | Discharge: 2023-09-11 | Disposition: A | Discharge status: Home / Self Care | Payer: Medicare PPO | Source: Ambulatory Visit | Attending: Internal Medicine | Admitting: Internal Medicine

## 2023-09-11 DIAGNOSIS — Z1231 Encounter for screening mammogram for malignant neoplasm of breast: Secondary | ICD-10-CM | POA: Diagnosis not present

## 2023-09-17 DIAGNOSIS — Z860100 Personal history of colon polyps, unspecified: Secondary | ICD-10-CM | POA: Diagnosis not present

## 2023-09-17 DIAGNOSIS — Z1211 Encounter for screening for malignant neoplasm of colon: Secondary | ICD-10-CM | POA: Diagnosis not present

## 2023-09-17 DIAGNOSIS — R140 Abdominal distension (gaseous): Secondary | ICD-10-CM | POA: Diagnosis not present

## 2023-09-17 DIAGNOSIS — K5904 Chronic idiopathic constipation: Secondary | ICD-10-CM | POA: Diagnosis not present

## 2023-09-21 ENCOUNTER — Encounter: Payer: Self-pay | Admitting: Internal Medicine

## 2023-09-24 ENCOUNTER — Encounter: Payer: Self-pay | Admitting: Internal Medicine

## 2023-09-24 ENCOUNTER — Ambulatory Visit: Payer: Medicare PPO | Admitting: Internal Medicine

## 2023-09-24 VITALS — BP 116/60 | HR 63 | Temp 97.9°F | Ht 64.0 in | Wt 162.6 lb

## 2023-09-24 DIAGNOSIS — E782 Mixed hyperlipidemia: Secondary | ICD-10-CM

## 2023-09-24 DIAGNOSIS — F4329 Adjustment disorder with other symptoms: Secondary | ICD-10-CM | POA: Diagnosis not present

## 2023-09-24 DIAGNOSIS — Z79899 Other long term (current) drug therapy: Secondary | ICD-10-CM

## 2023-09-24 DIAGNOSIS — Z0000 Encounter for general adult medical examination without abnormal findings: Secondary | ICD-10-CM | POA: Diagnosis not present

## 2023-09-24 LAB — COMPREHENSIVE METABOLIC PANEL
ALT: 22 U/L (ref 0–35)
AST: 21 U/L (ref 0–37)
Albumin: 4.7 g/dL (ref 3.5–5.2)
Alkaline Phosphatase: 70 U/L (ref 39–117)
BUN: 16 mg/dL (ref 6–23)
CO2: 27 meq/L (ref 19–32)
Calcium: 9.2 mg/dL (ref 8.4–10.5)
Chloride: 101 meq/L (ref 96–112)
Creatinine, Ser: 0.79 mg/dL (ref 0.40–1.20)
GFR: 73.7 mL/min (ref >60.00)
Glucose, Bld: 89 mg/dL (ref 70–99)
Potassium: 4.2 meq/L (ref 3.5–5.1)
Sodium: 140 meq/L (ref 135–145)
Total Bilirubin: 0.5 mg/dL (ref 0.2–1.2)
Total Protein: 6.9 g/dL (ref 6.0–8.3)

## 2023-09-24 LAB — CBC WITH DIFFERENTIAL/PLATELET
Basophils Absolute: 0.1 10*3/uL (ref 0.0–0.1)
Basophils Relative: 1 % (ref 0.0–3.0)
Eosinophils Absolute: 0.1 10*3/uL (ref 0.0–0.7)
Eosinophils Relative: 1.4 % (ref 0.0–5.0)
HCT: 39.7 % (ref 36.0–46.0)
Hemoglobin: 13.1 g/dL (ref 12.0–15.0)
Lymphocytes Relative: 34.9 % (ref 12.0–46.0)
Lymphs Abs: 1.8 10*3/uL (ref 0.7–4.0)
MCHC: 33.1 g/dL (ref 30.0–36.0)
MCV: 82.2 fL (ref 78.0–100.0)
Monocytes Absolute: 0.5 10*3/uL (ref 0.1–1.0)
Monocytes Relative: 9.9 % (ref 3.0–12.0)
Neutro Abs: 2.7 10*3/uL (ref 1.4–7.7)
Neutrophils Relative %: 52.8 % (ref 43.0–77.0)
Platelets: 249 10*3/uL (ref 150.0–400.0)
RBC: 4.83 Mil/uL (ref 3.87–5.11)
RDW: 13.9 % (ref 11.5–15.5)
WBC: 5.2 10*3/uL (ref 4.0–10.5)

## 2023-09-24 LAB — LIPID PANEL
Cholesterol: 189 mg/dL (ref 0–200)
HDL: 74.9 mg/dL (ref >39.00)
LDL Cholesterol: 92 mg/dL (ref 0–99)
NonHDL: 114.55
Total CHOL/HDL Ratio: 3
Triglycerides: 114 mg/dL (ref 0.0–149.0)
VLDL: 22.8 mg/dL (ref 0.0–40.0)

## 2023-09-24 LAB — TSH: TSH: 2.43 u[IU]/mL (ref 0.35–5.50)

## 2023-09-24 NOTE — Progress Notes (Signed)
Chief Complaint  Patient presents with   Annual Exam    Pt is fasted for blood work.      HPI: Patient  Pamela [name]  73 y.o. comes in today for Preventive Health Care visit  No major changes in health but  Anxiety  struggling.   Dealing with some things.   HLD:  taking crestor 2 x per week   ocass 3   if increase gets achy legs   Health Maintenance  Topic Date Due   COVID-19 Vaccine (6 - 2024-25 season) 10/10/2023 (Originally 09/10/2023)   Medicare Annual Wellness (AWV)  05/04/2024   MAMMOGRAM  09/10/2025   DTaP/Tdap/Td (3 - Tdap) 01/13/2027   Colonoscopy  09/03/2032   Pneumonia Vaccine 35+ Years old  Completed   INFLUENZA VACCINE  Completed   DEXA SCAN  Completed   Hepatitis C Screening  Completed   Zoster Vaccines- Shingrix  Completed   HPV VACCINES  Aged Out   Health Maintenance Review LIFESTYLE:  Exercise:  not what I should  walks  dog. Tobacco/ETS: N Alcohol:  one Sugar beverages: no reg  Sleep: 6-7  Drug use: no HH of 1  dog   ROS:  GEN/ HEENT: No fever, significant weight changes sweats headaches vision problems hearing changes, CV/ PULM; No chest pain shortness of breath cough, syncope,edema  change in exercise tolerance. GI /GU: No adominal pain, vomiting, change in bowel habits. No blood in the stool. No significant GU symptoms. SKIN/HEME: ,no acute skin rashes suspicious lesions or bleeding. No lymphadenopathy, nodules, masses.  NEURO/ PSYCH:  No neurologic signs such as weakness numbness. No depression anxiety. IMM/ Allergy: No unusual infections.  Allergy .   REST of 12 system review negative except as per HPI   Past Medical History:  Diagnosis Date   Arthritis KNEES, HANDS, SHOULDERS   Depression    Hx of Clostridium difficile infection    Hyperlipidemia, mixed    Impingement syndrome of right shoulder    Intestinal bacterial overgrowth 06/20/2013   Under rx per dr Loreta Ave    Skin cancer    Danella Deis    Vaginitis, atrophic    recurrent     Past Surgical History:  Procedure Laterality Date   ABDOMINAL HYSTERECTOMY  1992   W/ BILATERAL SALPINGO-OOPHORECTOMY   CARPAL TUNNEL RELEASE     SHOULDER ARTHROSCOPY WITH ROTATOR CUFF REPAIR  08/06/2012   Procedure: SHOULDER ARTHROSCOPY WITH ROTATOR CUFF REPAIR;  Surgeon: Drucilla Schmidt, MD;  Location: [facility];  Service: Orthopedics;  Laterality: Right;   SHOULDER ARTHROSCOPY WITH SUBACROMIAL DECOMPRESSION  08/06/2012   Procedure: SHOULDER ARTHROSCOPY WITH SUBACROMIAL DECOMPRESSION;  Surgeon: Drucilla Schmidt, MD;  Location: [facility];  Service: Orthopedics;  Laterality: Right;  debridement of labral   TONSILLECTOMY  AS CHILD    Family History  Problem Relation Age of Onset   Arthritis Sister         CPPD autoinmmune   Hyperlipidemia Other        family hx   Hypertension Other        family hx   Sudden death Other        family hx   Heart disease Other        family hx   Multiple myeloma Father        deceased   Stroke Mother 39    Social History   Socioeconomic History   Marital status: Widowed    Spouse name: Not  on file   Number of children: Not on file   Years of education: Not on file   Highest education level: Not on file  Occupational History   Occupation: retired    Associate Professor: RETIRED  Tobacco Use   Smoking status: Never   Smokeless tobacco: Never  Substance and Sexual Activity   Alcohol use: Yes    Comment: OCCASIONAL   Drug use: No   Sexual activity: Not on file  Other Topics Concern   Not on file  Social History Narrative   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  Has smoke detector and wears seat belts.  No firearms. No excess sun exposure. Sees dentist regularly .   Dexa nl Oct 28, 2003 and 27-Oct-2006   Husband inhpsicde care for  Throat  Cancer  2017-10-27 passed away August 26, 2018  Social Drivers of Health   Financial Resource  Strain: Low Risk  (04/30/2022)   Overall Financial Resource Strain (CARDIA)    Difficulty of Paying Living Expenses: Not hard at all  Food Insecurity: No Food Insecurity (04/30/2022)   Hunger Vital Sign    Worried About Running Out of Food in the Last Year: Never true    Ran Out of Food in the Last Year: Never true  Transportation Needs: No Transportation Needs (04/30/2022)   PRAPARE - Administrator, Civil Service (Medical): No    Lack of Transportation (Non-Medical): No  Physical Activity: Insufficiently Active (09/24/2023)   Exercise Vital Sign    Days of Exercise per Week: 7 days    Minutes of Exercise per Session: 10 min  Stress: Stress Concern Present (09/24/2023)   [employer] of Occupational Health - Occupational Stress Questionnaire    Feeling of Stress : Rather much  Social Connections: Moderately Integrated (04/30/2022)   Social Connection and Isolation Panel [NHANES]    Frequency of Communication with Friends and Family: More than three times a week    Frequency of Social Gatherings with Friends and Family: More than three times a week    Attends Religious Services: More than 4 times per year    Active Member of [employer] or Organizations: Yes    Attends Banker Meetings: More than 4 times per year    Marital Status: Widowed    Outpatient Medications Prior to Visit  Medication Sig Dispense Refill   conjugated estrogens (PREMARIN) vaginal cream Use vaginally as instructed 4 times a week. 42.5 g 5   rosuvastatin (CRESTOR) 5 MG tablet Take crestor 5 mg po 3 days per week  and then as directed . (Patient taking differently: Take 5 mg by mouth. Take crestor 5 mg po 2 days per week  and then as directed .) 36 tablet 2   sertraline (ZOLOFT) 100 MG tablet TAKE 1 TABLET BY MOUTH DAILY 90 tablet 0   VITAMIN D PO Take by mouth.     conjugated estrogens (PREMARIN) vaginal cream Use vaginally as instructed twice a day 42.5 g 5   No facility-administered medications  prior to visit.     EXAM:  BP 116/60 (BP Location: Left Arm, Patient Position: Sitting, Cuff Size: Normal)   Pulse 63   Temp 97.9 F (36.6 C) (Oral)   Ht 5\' 4"  (1.626 m)   Wt 162 lb 9.6 oz (73.8 kg)   SpO2 97%  BMI 27.91 kg/m   Body mass index is 27.91 kg/m. Wt Readings from Last 3 Encounters:  09/24/23 162 lb 9.6 oz (73.8 kg)  05/05/23 157 lb (71.2 kg)  09/22/22 167 lb (75.8 kg)    Physical Exam: Vital signs reviewed UEA:VWUJ is a well-developed well-nourished alert cooperative    who appearsr stated age in no acute distress.  HEENT: normocephalic atraumatic , Eyes: PERRL EOM's full, conjunctiva clear, Nares: paten,t no deformity discharge or tenderness., Ears: no deformity EAC's clear TMs with normal landmarks. Mouth: clear OP, no lesions, edema.  Moist mucous membranes. Dentition in adequate repair. NECK: supple without masses, thyromegaly or bruits. CHEST/PULM:  Clear to auscultation and percussion breath sounds equal no wheeze , rales or rhonchi. No chest wall deformities or tenderness. Breast: normal by inspection . No dimpling, discharge, masses, tenderness or discharge . CV: PMI is nondisplaced, S1 S2 no gallops, murmurs, rubs. Peripheral pulses are full without delay.No JVD .  ABDOMEN: Bowel sounds normal nontender  No guard or rebound, no hepato splenomegal no CVA tenderness.  Extremtities:  No clubbing cyanosis or edema, no acute joint swelling or redness no focal atrophy NEURO:  Oriented x3, cranial nerves 3-12 appear to be intact, no obvious focal weakness,gait within normal limits no abnormal reflexes or asymmetrical SKIN: No acute rashes normal turgor, color, no bruising or petechiae. Great toenail with bilateral v split  base is clear  PSYCH: Oriented, good eye contact,  cognition and judgment appear normal. LN: no cervical axillary  adenopathy  Lab Results  Component Value Date   WBC 5.2 09/24/2023   HGB 13.1 09/24/2023   HCT 39.7 09/24/2023   PLT 249.0  09/24/2023   GLUCOSE 89 09/24/2023   CHOL 189 09/24/2023   TRIG 114.0 09/24/2023   HDL 74.90 09/24/2023   LDLDIRECT 173.5 06/13/2013   LDLCALC 92 09/24/2023   ALT 22 09/24/2023   AST 21 09/24/2023   NA 140 09/24/2023   K 4.2 09/24/2023   CL 101 09/24/2023   CREATININE 0.79 09/24/2023   BUN 16 09/24/2023   CO2 27 09/24/2023   TSH 2.43 09/24/2023    BP Readings from Last 3 Encounters:  09/24/23 116/60  05/05/23 136/67  10/30/22 110/60    Lab results reviewed with patient   ASSESSMENT AND PLAN:  Discussed the following assessment and plan:    ICD-10-CM   1. Routine general medical examination at a health care facility  Z00.00     2. Mixed hyperlipidemia  E78.2 CBC with Differential/Platelet    TSH    Lipid panel    Comprehensive metabolic panel    3. Medication management  Z79.899 CBC with Differential/Platelet    TSH    Lipid panel    Comprehensive metabolic panel    4. Adjustment disorder with other symptom  F43.29 CBC with Differential/Platelet    TSH    Lipid panel    Comprehensive metabolic panel    Disc  interventions  Counseling hospice loss  Continue social contacts . Cont statin as tolerated and follow labs.  Return 6-12 months ?, for depending on results.  Patient Care Team: Manar Smalling, Neta Mends, MD as PCP - General Durene Romans, MD (Orthopedic Surgery) Charna Elizabeth, MD as Attending Physician (Gastroenterology) Durene Romans, MD as Consulting Physician (Orthopedic Surgery) Patient Instructions  Lab today . Consider  adding other options  if needed for lipid management. Such as zetia .  Work on compartmentalizing   hard unpleasant tasks time.  Counseling again may help.  More walks to get outside   as a plan.   Exam is good today .   Neta Mends. Genasis Zingale M.D.

## 2023-09-24 NOTE — Patient Instructions (Signed)
Lab today . Consider  adding other options  if needed for lipid management. Such as zetia .  Work on compartmentalizing   hard unpleasant tasks time.  Counseling again may help.  More walks to get outside   as a plan.   Exam is good today .

## 2023-09-25 ENCOUNTER — Encounter: Payer: Self-pay | Admitting: Internal Medicine

## 2023-09-25 NOTE — Progress Notes (Signed)
Blood work results are good and normal in range . Cholesterol is  in a good range  ldl below 100. Much better !   Can stay on the 3-4 days a week crestor as planned

## 2023-09-28 ENCOUNTER — Encounter: Payer: Self-pay | Admitting: Internal Medicine

## 2023-10-22 ENCOUNTER — Other Ambulatory Visit: Payer: Self-pay

## 2023-10-22 MED ORDER — SERTRALINE HCL 100 MG PO TABS: 100.0000 mg | ORAL_TABLET | Freq: Every day | ORAL | 0 refills | Status: DC

## 2024-01-17 ENCOUNTER — Other Ambulatory Visit: Payer: Self-pay | Admitting: Internal Medicine

## 2024-01-20 DIAGNOSIS — D122 Benign neoplasm of ascending colon: Secondary | ICD-10-CM | POA: Diagnosis not present

## 2024-01-20 DIAGNOSIS — K635 Polyp of colon: Secondary | ICD-10-CM | POA: Diagnosis not present

## 2024-01-20 DIAGNOSIS — Z860100 Personal history of colon polyps, unspecified: Secondary | ICD-10-CM | POA: Diagnosis not present

## 2024-01-20 DIAGNOSIS — Z1211 Encounter for screening for malignant neoplasm of colon: Secondary | ICD-10-CM | POA: Diagnosis not present

## 2024-01-20 LAB — HM COLONOSCOPY

## 2024-02-16 ENCOUNTER — Other Ambulatory Visit: Payer: Self-pay | Admitting: Internal Medicine

## 2024-02-16 DIAGNOSIS — E782 Mixed hyperlipidemia: Secondary | ICD-10-CM

## 2024-04-15 DIAGNOSIS — L565 Disseminated superficial actinic porokeratosis (DSAP): Secondary | ICD-10-CM | POA: Diagnosis not present

## 2024-04-15 DIAGNOSIS — L719 Rosacea, unspecified: Secondary | ICD-10-CM | POA: Diagnosis not present

## 2024-04-15 DIAGNOSIS — D2271 Melanocytic nevi of right lower limb, including hip: Secondary | ICD-10-CM | POA: Diagnosis not present

## 2024-04-15 DIAGNOSIS — D225 Melanocytic nevi of trunk: Secondary | ICD-10-CM | POA: Diagnosis not present

## 2024-04-15 DIAGNOSIS — L821 Other seborrheic keratosis: Secondary | ICD-10-CM | POA: Diagnosis not present

## 2024-04-15 DIAGNOSIS — D485 Neoplasm of uncertain behavior of skin: Secondary | ICD-10-CM | POA: Diagnosis not present

## 2024-04-15 DIAGNOSIS — L820 Inflamed seborrheic keratosis: Secondary | ICD-10-CM | POA: Diagnosis not present

## 2024-04-15 DIAGNOSIS — D2261 Melanocytic nevi of right upper limb, including shoulder: Secondary | ICD-10-CM | POA: Diagnosis not present

## 2024-04-15 DIAGNOSIS — Z85828 Personal history of other malignant neoplasm of skin: Secondary | ICD-10-CM | POA: Diagnosis not present

## 2024-04-15 DIAGNOSIS — L578 Other skin changes due to chronic exposure to nonionizing radiation: Secondary | ICD-10-CM | POA: Diagnosis not present

## 2024-04-24 ENCOUNTER — Other Ambulatory Visit: Payer: Self-pay | Admitting: Internal Medicine

## 2024-05-31 ENCOUNTER — Encounter: Payer: Self-pay | Admitting: Family Medicine

## 2024-05-31 ENCOUNTER — Ambulatory Visit: Admitting: Family Medicine

## 2024-05-31 DIAGNOSIS — Z0000 Encounter for general adult medical examination without abnormal findings: Secondary | ICD-10-CM | POA: Diagnosis not present

## 2024-05-31 NOTE — Progress Notes (Signed)
 Patient was unable to self-report due to a lack of equipment at home via telehealth

## 2024-05-31 NOTE — Patient Instructions (Signed)
 I really enjoyed getting to talk with you today! I am available on Tuesdays and Thursdays for virtual visits if you have any questions or concerns, or if I can be of any further assistance.   CHECKLIST FROM ANNUAL WELLNESS VISIT:  -Follow up (please call to schedule if not scheduled after visit):   -yearly for annual wellness visit with primary care office  Here is a list of your preventive care/health maintenance measures and the plan for each if any are due:  PLAN For any measures below that may be due:    -can get vaccines at the pharmacy, please provide us  with proof of receipt when you do so that we can update your chart accurately.  Health Maintenance  Topic Date Due   Influenza Vaccine  03/25/2024   COVID-19 Vaccine (6 - Mixed Product risk 2024-25 season) 06/16/2024 (Originally 04/25/2024)   Medicare Annual Wellness (AWV)  05/31/2025   DTaP/Tdap/Td (3 - Tdap) 01/13/2027   Colonoscopy  01/19/2034   Pneumococcal Vaccine: 50+ Years  Completed   DEXA SCAN  Completed   Hepatitis C Screening  Completed   Zoster Vaccines- Shingrix  Completed   Meningococcal B Vaccine  Aged Out   Mammogram  Discontinued    -See a dentist at least yearly  -Get your eyes checked and then per your eye specialist's recommendations  -Other issues addressed today:   STRESS MANAGEMENT: -can try meditating, or just sitting quietly with deep breathing while intentionally relaxing all parts of your body for 5 minutes daily - exercise for a least 150 minutes per week -socialize often  -if you need further help with stress, anxiety or depression please follow up with your primary doctor or contact the wonderful folks at [facility]: [phone]   -I have included below further information regarding a healthy whole foods based diet, physical activity guidelines for adults, stress management and opportunities for social connections. I hope you find this information useful.    -----------------------------------------------------------------------------------------------------------------------------------------------------------------------------------------------------------------------------------------------------------    NUTRITION: -eat real food: lots of colorful vegetables (half the plate) and fruits -5-7 servings of vegetables and fruits per day (fresh or steamed is best), exp. 2 servings of vegetables with lunch and dinner and 2 servings of fruit per day. Berries and greens such as kale and collards are great choices.  -consume on a regular basis:  fresh fruits, fresh veggies, fish, nuts, seeds, healthy oils (such as olive oil, avocado oil), whole grains (make sure for bread/pasta/crackers/etc., that the first ingredient on label contains the word whole), legumes. -can eat small amounts of dairy and lean meat (no larger than the palm of your hand), but avoid processed meats such as ham, bacon, lunch meat, etc. -drink water -try to avoid fast food and pre-packaged foods, processed meat, ultra processed foods/beverages (donuts, candy, etc.) -most experts advise limiting sodium to < 2300mg  per day, should limit further is any chronic conditions such as high blood pressure, heart disease, diabetes, etc. The American Heart Association advised that < 1500mg  is is ideal -try to avoid foods/beverages that contain any ingredients with names you do not recognize  -try to avoid foods/beverages  with added sugar or sweeteners/sweets  -try to avoid sweet drinks (including diet drinks): soda, juice, Gatorade, sweet tea, power drinks, diet drinks -try to avoid white rice, white bread, pasta (unless whole grain)  EXERCISE GUIDELINES FOR ADULTS: -if you wish to increase your physical activity, do so gradually and with the approval of your doctor -STOP and seek medical care immediately  if you have any chest pain, chest discomfort or trouble breathing when starting or  increasing exercise  -move and stretch your body, legs, feet and arms when sitting for long periods -Physical activity guidelines for optimal health in adults: -get at least 150 minutes per week of moderate exercise (can talk, but not sing); this is about 20-30 minutes of sustained activity 5-7 days per week or two 10-15 minute episodes of sustained activity 5-7 days per week -do some muscle building/resistance training/strength training at least 2 days per week  -balance exercises 3+ days per week:   Stand somewhere where you have something sturdy to hold onto if you lose balance    1) lift up on toes, then back down, start with 5x per day and work up to 20x   2) stand and lift one leg straight out to the side so that foot is a few inches of the floor, start with 5x each side and work up to 20x each side   3) stand on one foot, start with 5 seconds each side and work up to 20 seconds on each side  If you need ideas or help with getting more active:  -Silver sneakers https://tools.silversneakers.com  -Walk with a Doc: http://www.duncan-williams.com/  -try to include resistance (weight lifting/strength building) and balance exercises twice per week: or the following link for ideas: http://castillo-powell.com/  BuyDucts.dk    SOCIAL CONNECTIONS: -options in [place] if you wish to engage in more social and exercise related activities:  -Silver sneakers https://tools.silversneakers.com  -Walk with a Doc: http://www.duncan-williams.com/  -Check out the [facility] Active Adults 50+ section on the [place] [employer] (hiking clubs, book clubs, cards and games, chess, exercise classes, aquatic classes and much more) - see the website for details: https://www.[place].gov/departments/parks-recreation/active-adults50  -YouTube has lots of exercise videos for different ages and abilities as  well  -[facility] (a variety of indoor and outdoor inperson activities for adults). [phone]. [address]  -Virtual Online Classes (a variety of topics): see seniorplanet.org or call [phone]  -consider volunteering at a school, hospice center, church, senior center or elsewhere

## 2024-05-31 NOTE — Progress Notes (Signed)
 PATIENT CHECK-IN and HEALTH RISK ASSESSMENT QUESTIONNAIRE:  -completed by phone/video for upcoming Medicare Preventive Visit   Pre-Visit Check-in: 1)Vitals (height, wt, BP, etc) - record in vitals section for visit on day of visit Request home vitals (wt, BP, etc.) and enter into vitals, THEN update Vital Signs SmartPhrase below at the top of the HPI. See below.  2)Review and Update Medications, Allergies PMH, Surgeries, Social history in Epic 3)Hospitalizations in the last year with date/reason? NO   4)Review and Update Care Team (patient's specialists) in Epic 5) Complete PHQ9 in Epic  6) Complete Fall Screening in Epic 7)Review all Health Maintenance Due and order if not done.  Medicare Wellness Patient Questionnaire:  Answer theses question about your habits: How often do you have a drink containing alcohol?Yes, 3-4 times a week  How many drinks containing alcohol do you have on a typical day when you are drinking?1 drink  How often do you have six or more drinks on one occasion?no  Have you ever smoked?No  Quit date if applicable? Na   How many packs a day do/did you smoke? NA  Do you use smokeless tobacco?NO  Do you use an illicit drugs?NO  On average, how many days per week do you engage in moderate to strenuous exercise (like a brisk walk)?5 days a week  On average, how many minutes do you engage in exercise at this level?20 Minutes - walks the dog Are you sexually active? No Number of partners?Na  Typical breakfast: Cereal  Typical lunch:Sandwich or salad  Typical dinner: Meat and Vegetables  Typical snacks:Nuts cheese crackers   Beverages: Water coffee, coke Has close neighborhood group and see friends at least several times a week.   Answer theses question about your everyday activities: Can you perform most household chores?Yes  Are you deaf or have significant trouble hearing?No  Do you feel that you have a problem with memory?No  Do you feel safe at home?Yes   Last dentist visit?3 weeks ago  8. Do you have any difficulty performing your everyday activities?No  Are you having any difficulty walking, taking medications on your own, and or difficulty managing daily home needs?NO  Do you have difficulty walking or climbing stairs?NO  Do you have difficulty dressing or bathing?NO  Do you have difficulty doing errands alone such as visiting a doctor's office or shopping?NO  Do you currently have any difficulty preparing food and eating?NO  Do you currently have any difficulty using the toilet?NO  Do you have any difficulty managing your finances?NO  Do you have any difficulties with housekeeping of managing your housekeeping?No    Do you have Advanced Directives in place (Living Will, Healthcare Power or Attorney)? Yes    Last eye Exam and location?Jan 2025, Dr. Debarah    Do you currently use prescribed or non-prescribed narcotic or opioid pain medications? No   Do you have a history or close family history of breast, ovarian, tubal or peritoneal cancer or a family member with BRCA (breast cancer susceptibility 1 and 2) gene mutations? No    Nurse/Assistant Credentials/time stamp: Pamela [name] CMA 11:15am   ----------------------------------------------------------------------------------------------------------------------------------------------------------------------------------------------------------------------  Because this visit was a virtual/telehealth visit, some criteria may be missing or patient reported. Any vitals not documented were not able to be obtained and vitals that have been documented are patient reported.    MEDICARE ANNUAL PREVENTIVE CARE VISIT WITH PROVIDER (Welcome to Medicare, initial annual wellness or annual wellness exam)  Virtual Visit via Video Note  I connected with Pamela [name] on 05/31/24  by  a video enabled telemedicine application and verified that I am speaking with the correct person using  two identifiers.  Location patient: home Location provider:work or home office Persons participating in the virtual visit: patient, provider  Concerns and/or follow up today: Reports had some issues with anxiety recently, is working with medication and is doing ok. Has been dealing with grief and is far away family. Denies panic, hallucination, SI, or any other sever symptoms. Mostly just a lack of motivation.    See HM section in Epic for other details of completed HM.    ROS: negative for report of fevers, unintentional weight loss, vision changes, vision loss, hearing loss or change, chest pain, sob, hemoptysis, melena, hematochezia, hematuria, falls, bleeding or bruising, thoughts of suicide or self harm, memory loss  Patient-completed extensive health risk assessment - reviewed and discussed with the patient: See Health Risk Assessment completed with patient prior to the visit either above or in recent phone note. This was reviewed in detailed with the patient today and appropriate recommendations, orders and referrals were placed as needed per Summary below and patient instructions.   Review of Medical History: -PMH, PSH, Family History and current specialty and care providers reviewed and updated and listed below   Patient Care Team: Panosh, Apolinar POUR, MD as PCP - General Ernie Cough, MD (Orthopedic Surgery) Kristie Lamprey, MD as Attending Physician (Gastroenterology) Ernie Cough, MD as Consulting Physician (Orthopedic Surgery)   Past Medical History:  Diagnosis Date   Arthritis KNEES, HANDS, SHOULDERS   Depression    Hx of Clostridium difficile infection    Hyperlipidemia, mixed    Impingement syndrome of right shoulder    Intestinal bacterial overgrowth 06/20/2013   Under rx per dr Kristie    Skin cancer    [name]    Vaginitis, atrophic    recurrent    Past Surgical History:  Procedure Laterality Date   ABDOMINAL HYSTERECTOMY  1992   W/ BILATERAL  SALPINGO-OOPHORECTOMY   CARPAL TUNNEL RELEASE     SHOULDER ARTHROSCOPY WITH ROTATOR CUFF REPAIR  08/06/2012   Procedure: SHOULDER ARTHROSCOPY WITH ROTATOR CUFF REPAIR;  Surgeon: Lynwood SHAUNNA Bern, MD;  Location: [facility];  Service: Orthopedics;  Laterality: Right;   SHOULDER ARTHROSCOPY WITH SUBACROMIAL DECOMPRESSION  08/06/2012   Procedure: SHOULDER ARTHROSCOPY WITH SUBACROMIAL DECOMPRESSION;  Surgeon: Lynwood SHAUNNA Bern, MD;  Location: [facility];  Service: Orthopedics;  Laterality: Right;  debridement of labral   TONSILLECTOMY  AS CHILD    Social History   Socioeconomic History   Marital status: Widowed    Spouse name: Not on file   Number of children: Not on file   Years of education: Not on file   Highest education level: Master's degree (e.g., MA, MS, MEng, MEd, MSW, MBA)  Occupational History   Occupation: retired    Associate Professor: RETIRED  Tobacco Use   Smoking status: Never   Smokeless tobacco: Never  Substance and Sexual Activity   Alcohol use: Yes    Comment: OCCASIONAL   Drug use: No   Sexual activity: Not on file  Other Topics Concern   Not on file  Social History Narrative   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  Has smoke detector and wears seat belts.  No firearms. No excess sun exposure. Sees dentist regularly .   Dexa nl 06-18-2004 and 06-19-2007   Husband inhpsicde care for  Throat  Cancer  2018/06/18 passed away 08/18/2018  Social Drivers of Health   Financial Resource Strain: Low Risk  (05/30/2024)   Overall Financial Resource Strain (CARDIA)    Difficulty of Paying Living Expenses: Not hard at all  Food Insecurity: No Food Insecurity (05/30/2024)   Hunger Vital Sign    Worried About Running Out of Food in the Last Year: Never true    Ran Out of Food in the Last Year: Never true  Transportation Needs: No Transportation Needs (05/30/2024)   PRAPARE  - Administrator, Civil Service (Medical): No    Lack of Transportation (Non-Medical): No  Physical Activity: Insufficiently Active (05/30/2024)   Exercise Vital Sign    Days of Exercise per Week: 4 days    Minutes of Exercise per Session: 10 min  Stress: Stress Concern Present (05/30/2024)   [employer] of Occupational Health - Occupational Stress Questionnaire    Feeling of Stress: To some extent  Social Connections: Moderately Integrated (05/30/2024)   Social Connection and Isolation Panel    Frequency of Communication with Friends and Family: Three times a week    Frequency of Social Gatherings with Friends and Family: Once a week    Attends Religious Services: More than 4 times per year    Active Member of [employer] or Organizations: Yes    Attends Banker Meetings: More than 4 times per year    Marital Status: Widowed  Intimate Partner Violence: Not At Risk (04/30/2022)   Humiliation, Afraid, Rape, and Kick questionnaire    Fear of Current or Ex-Partner: No    Emotionally Abused: No    Physically Abused: No    Sexually Abused: No    Family History  Problem Relation Age of Onset   Arthritis Sister         CPPD autoinmmune   Hyperlipidemia Other        family hx   Hypertension Other        family hx   Sudden death Other        family hx   Heart disease Other        family hx   Multiple myeloma Father        deceased   Stroke Mother 58    Current Outpatient Medications on File Prior to Visit  Medication Sig Dispense Refill   conjugated estrogens  (PREMARIN ) vaginal cream Use vaginally as instructed 4 times a week. 42.5 g 5   rosuvastatin  (CRESTOR ) 5 MG tablet TAKE 1 TABLET BY MOUTH 3 TIMES PER WEEK AND THEN AS DIRECTED 36 tablet 2   sertraline  (ZOLOFT ) 100 MG tablet TAKE 1 TABLET BY MOUTH DAILY 90 tablet 0   VITAMIN D PO Take by mouth.     No current facility-administered medications on file prior to visit.    Allergies  Allergen Reactions    Simvastatin Other (See Comments)    JOINT ACHES   Atorvastatin      Muscle aches       Physical Exam Vitals requested from patient and listed below if patient had equipment and was able to obtain at home for this virtual visit: There were no vitals filed for this visit. Estimated body mass index is 27.91 kg/m as calculated from the following:   Height as of 09/24/23: 5' 4 (1.626 m).  Weight as of 09/24/23: 162 lb 9.6 oz (73.8 kg).  EKG (optional): deferred due to virtual visit  GENERAL: alert, oriented, no acute distress detected; full vision exam deferred due to pandemic and/or virtual encounter   HEENT: atraumatic, conjunttiva clear, no obvious abnormalities on inspection of external nose and ears  NECK: normal movements of the head and neck  LUNGS: on inspection no signs of respiratory distress, breathing rate appears normal, no obvious gross SOB, gasping or wheezing  CV: no obvious cyanosis  MS: moves all visible extremities without noticeable abnormality  PSYCH/NEURO: pleasant and cooperative, no obvious depression or anxiety, speech and thought processing grossly intact, Cognitive function grossly intact  Flowsheet Row Clinical Support from 05/31/2024 in [facility] [place]  PHQ-9 Total Score 3        05/31/2024   11:06 AM 09/24/2023    8:43 AM 05/05/2023   10:42 AM 09/22/2022    9:34 AM 04/30/2022   11:11 AM  Depression screen PHQ 2/9  Decreased Interest 1 2 1  0 0  Down, Depressed, Hopeless 1 1 0 0 0  PHQ - 2 Score 2 3 1  0 0  Altered sleeping 1 1  0   Tired, decreased energy 0 1  0   Change in appetite 0 0  0   Feeling bad or failure about yourself  0 0  0   Trouble concentrating 0 0  0   Moving slowly or fidgety/restless 0 0  0   Suicidal thoughts 0 0  0   PHQ-9 Score 3 5  0   Difficult doing work/chores  Somewhat difficult  Not difficult at all        04/30/2022   11:14 AM 09/22/2022    9:34 AM 05/05/2023   10:03 AM 09/24/2023     8:43 AM 05/30/2024    7:58 PM  Fall Risk  Falls in the past year? 0 0 0 0 0  Was there an injury with Fall? 0 0 0 0 0  Fall Risk Category Calculator 0 0 0  0 0   Fall Risk Category (Retired) Low       (RETIRED) Patient Fall Risk Level Low fall risk       Patient at Risk for Falls Due to No Fall Risks No Fall Risks  No Fall Risks   Fall risk Follow up  Falls evaluation completed  Falls evaluation completed      Patient-reported   Data saved with a previous flowsheet row definition     SUMMARY AND PLAN:  Encounter for Medicare annual wellness exam   Discussed applicable health maintenance/preventive health measures and advised and referred or ordered per patient preferences: -discussed vaccines due and she plans to get at the pharmacy -discussed bone density test, she had 2 years ago and it was normal, she declined today Health Maintenance  Topic Date Due   Influenza Vaccine  03/25/2024   COVID-19 Vaccine (6 - Mixed Product risk 2024-25 season) 06/16/2024 (Originally 04/25/2024)   Medicare Annual Wellness (AWV)  05/31/2025   DTaP/Tdap/Td (3 - Tdap) 01/13/2027   Colonoscopy  01/19/2034   Pneumococcal Vaccine: 50+ Years  Completed   DEXA SCAN  Completed   Hepatitis C Screening  Completed   Zoster Vaccines- Shingrix  Completed   Meningococcal B Vaccine  Aged Out   Mammogram  Discontinued     Education and counseling on the following was provided based on the above review of health and a plan/checklist for  the patient, along with additional information discussed, was provided for the patient in the patient instructions :   -Advised and counseled on a healthy lifestyle - including the importance of a healthy diet, regular physical activity, social connections and stress management. -discussed options for counseling/CBT and she is considering. She completed grief counseling and found it helpful. Advised if anxiety or depression worsening, any severe symptoms, SI or not improving to  seek in person care.  -Reviewed patient's current diet. Advised and counseled on a whole foods based healthy diet. A summary of a healthy diet was provided in the Patient Instructions.  -reviewed patient's current physical activity level and discussed exercise guidelines for adults. Discussed community resources and ideas for safe exercise at home to assist in meeting exercise guideline recommendations in a safe and healthy way. [facility]  -Advise yearly dental visits at minimum and regular eye exams -Advised and counseled on alcohol safe limits,  Follow up: see patient instructions   Patient Instructions  I really enjoyed getting to talk with you today! I am available on Tuesdays and Thursdays for virtual visits if you have any questions or concerns, or if I can be of any further assistance.   CHECKLIST FROM ANNUAL WELLNESS VISIT:  -Follow up (please call to schedule if not scheduled after visit):   -yearly for annual wellness visit with primary care office  Here is a list of your preventive care/health maintenance measures and the plan for each if any are due:  PLAN For any measures below that may be due:    -can get vaccines at the pharmacy, please provide us  with proof of receipt when you do so that we can update your chart accurately.  Health Maintenance  Topic Date Due   Influenza Vaccine  03/25/2024   COVID-19 Vaccine (6 - Mixed Product risk 2024-25 season) 06/16/2024 (Originally 04/25/2024)   Medicare Annual Wellness (AWV)  05/31/2025   DTaP/Tdap/Td (3 - Tdap) 01/13/2027   Colonoscopy  01/19/2034   Pneumococcal Vaccine: 50+ Years  Completed   DEXA SCAN  Completed   Hepatitis C Screening  Completed   Zoster Vaccines- Shingrix  Completed   Meningococcal B Vaccine  Aged Out   Mammogram  Discontinued    -See a dentist at least yearly  -Get your eyes checked and then per your eye specialist's recommendations  -Other issues addressed  today:   STRESS MANAGEMENT: -can try meditating, or just sitting quietly with deep breathing while intentionally relaxing all parts of your body for 5 minutes daily - exercise for a least 150 minutes per week -socialize often  -if you need further help with stress, anxiety or depression please follow up with your primary doctor or contact the wonderful folks at [facility]: [phone]   -I have included below further information regarding a healthy whole foods based diet, physical activity guidelines for adults, stress management and opportunities for social connections. I hope you find this information useful.   -----------------------------------------------------------------------------------------------------------------------------------------------------------------------------------------------------------------------------------------------------------    NUTRITION: -eat real food: lots of colorful vegetables (half the plate) and fruits -5-7 servings of vegetables and fruits per day (fresh or steamed is best), exp. 2 servings of vegetables with lunch and dinner and 2 servings of fruit per day. Berries and greens such as kale and collards are great choices.  -consume on a regular basis:  fresh fruits, fresh veggies, fish, nuts, seeds, healthy oils (such as olive oil, avocado oil), whole grains (make sure for bread/pasta/crackers/etc.,  that the first ingredient on label contains the word whole), legumes. -can eat small amounts of dairy and lean meat (no larger than the palm of your hand), but avoid processed meats such as ham, bacon, lunch meat, etc. -drink water -try to avoid fast food and pre-packaged foods, processed meat, ultra processed foods/beverages (donuts, candy, etc.) -most experts advise limiting sodium to < 2300mg  per day, should limit further is any chronic conditions such as high blood pressure, heart disease, diabetes, etc. The American Heart Association  advised that < 1500mg  is is ideal -try to avoid foods/beverages that contain any ingredients with names you do not recognize  -try to avoid foods/beverages  with added sugar or sweeteners/sweets  -try to avoid sweet drinks (including diet drinks): soda, juice, Gatorade, sweet tea, power drinks, diet drinks -try to avoid white rice, white bread, pasta (unless whole grain)  EXERCISE GUIDELINES FOR ADULTS: -if you wish to increase your physical activity, do so gradually and with the approval of your doctor -STOP and seek medical care immediately if you have any chest pain, chest discomfort or trouble breathing when starting or increasing exercise  -move and stretch your body, legs, feet and arms when sitting for long periods -Physical activity guidelines for optimal health in adults: -get at least 150 minutes per week of moderate exercise (can talk, but not sing); this is about 20-30 minutes of sustained activity 5-7 days per week or two 10-15 minute episodes of sustained activity 5-7 days per week -do some muscle building/resistance training/strength training at least 2 days per week  -balance exercises 3+ days per week:   Stand somewhere where you have something sturdy to hold onto if you lose balance    1) lift up on toes, then back down, start with 5x per day and work up to 20x   2) stand and lift one leg straight out to the side so that foot is a few inches of the floor, start with 5x each side and work up to 20x each side   3) stand on one foot, start with 5 seconds each side and work up to 20 seconds on each side  If you need ideas or help with getting more active:  -Silver sneakers https://tools.silversneakers.com  -Walk with a Doc: http://www.duncan-williams.com/  -try to include resistance (weight lifting/strength building) and balance exercises twice per week: or the following link for  ideas: http://castillo-powell.com/  BuyDucts.dk    SOCIAL CONNECTIONS: -options in [place] if you wish to engage in more social and exercise related activities:  -Silver sneakers https://tools.silversneakers.com  -Walk with a Doc: http://www.duncan-williams.com/  -Check out the [facility] Active Adults 50+ section on the [place] [employer] (hiking clubs, book clubs, cards and games, chess, exercise classes, aquatic classes and much more) - see the website for details: https://www.[place].gov/departments/parks-recreation/active-adults50  -YouTube has lots of exercise videos for different ages and abilities as well  -[facility] (a variety of indoor and outdoor inperson activities for adults). 8564724286. [address]  -Virtual Online Classes (a variety of topics): see seniorplanet.org or call 365-617-9229  -consider volunteering at a school, hospice center, church, senior center or elsewhere            Chiquita JONELLE Cramp, DO

## 2024-08-02 ENCOUNTER — Other Ambulatory Visit: Payer: Self-pay | Admitting: Internal Medicine

## 2024-08-12 ENCOUNTER — Other Ambulatory Visit: Payer: Self-pay | Admitting: Internal Medicine

## 2024-08-12 DIAGNOSIS — Z1231 Encounter for screening mammogram for malignant neoplasm of breast: Secondary | ICD-10-CM

## 2024-08-31 ENCOUNTER — Telehealth: Payer: Self-pay

## 2024-08-31 NOTE — Telephone Encounter (Signed)
 Follow up with pt regarding to a call on 08/25/2024 stating  Callers states they have a headache and was around others recently who had the flu.  Pt reports she was having headache during Christmas holiday and everybody in family had come in contact with flu. Would like to know if she needs to be medication.   Currently pt reports she is feeling better and had her flu vaccine and was surprise she didn't get the flu.   Pt doesn't need anything at this time.

## 2024-09-13 ENCOUNTER — Ambulatory Visit
Admission: RE | Admit: 2024-09-13 | Discharge: 2024-09-13 | Disposition: A | Source: Ambulatory Visit | Attending: Internal Medicine | Admitting: Internal Medicine

## 2024-09-13 DIAGNOSIS — Z1231 Encounter for screening mammogram for malignant neoplasm of breast: Secondary | ICD-10-CM

## 2024-09-29 ENCOUNTER — Ambulatory Visit: Payer: Medicare PPO | Admitting: Internal Medicine

## 2024-09-29 ENCOUNTER — Encounter: Payer: Self-pay | Admitting: Internal Medicine

## 2024-09-29 VITALS — BP 110/64 | HR 70 | Temp 98.0°F | Ht 64.0 in | Wt 171.6 lb

## 2024-09-29 DIAGNOSIS — E782 Mixed hyperlipidemia: Secondary | ICD-10-CM

## 2024-09-29 DIAGNOSIS — M791 Myalgia, unspecified site: Secondary | ICD-10-CM

## 2024-09-29 DIAGNOSIS — Z0000 Encounter for general adult medical examination without abnormal findings: Secondary | ICD-10-CM

## 2024-09-29 DIAGNOSIS — Z79899 Other long term (current) drug therapy: Secondary | ICD-10-CM

## 2024-09-29 LAB — COMPREHENSIVE METABOLIC PANEL WITH GFR
ALT: 16 U/L (ref 3–35)
AST: 18 U/L (ref 5–37)
Albumin: 4.6 g/dL (ref 3.5–5.2)
Alkaline Phosphatase: 68 U/L (ref 39–117)
BUN: 17 mg/dL (ref 6–23)
CO2: 32 meq/L (ref 19–32)
Calcium: 9.6 mg/dL (ref 8.4–10.5)
Chloride: 102 meq/L (ref 96–112)
Creatinine, Ser: 0.93 mg/dL (ref 0.40–1.20)
GFR: 60.17 mL/min
Glucose, Bld: 91 mg/dL (ref 70–99)
Potassium: 4.4 meq/L (ref 3.5–5.1)
Sodium: 138 meq/L (ref 135–145)
Total Bilirubin: 0.5 mg/dL (ref 0.2–1.2)
Total Protein: 6.9 g/dL (ref 6.0–8.3)

## 2024-09-29 LAB — LIPID PANEL
Cholesterol: 303 mg/dL — ABNORMAL HIGH (ref 28–200)
HDL: 79.2 mg/dL
LDL Cholesterol: 198 mg/dL — ABNORMAL HIGH (ref 10–99)
NonHDL: 223.56
Total CHOL/HDL Ratio: 4
Triglycerides: 129 mg/dL (ref 10.0–149.0)
VLDL: 25.8 mg/dL (ref 0.0–40.0)

## 2024-09-29 LAB — CBC WITH DIFFERENTIAL/PLATELET
Basophils Absolute: 0 10*3/uL (ref 0.0–0.1)
Basophils Relative: 1 % (ref 0.0–3.0)
Eosinophils Absolute: 0 10*3/uL (ref 0.0–0.7)
Eosinophils Relative: 0.9 % (ref 0.0–5.0)
HCT: 36.6 % (ref 36.0–46.0)
Hemoglobin: 12.3 g/dL (ref 12.0–15.0)
Lymphocytes Relative: 35.4 % (ref 12.0–46.0)
Lymphs Abs: 1.6 10*3/uL (ref 0.7–4.0)
MCHC: 33.7 g/dL (ref 30.0–36.0)
MCV: 78.9 fl (ref 78.0–100.0)
Monocytes Absolute: 0.4 10*3/uL (ref 0.1–1.0)
Monocytes Relative: 9.1 % (ref 3.0–12.0)
Neutro Abs: 2.5 10*3/uL (ref 1.4–7.7)
Neutrophils Relative %: 53.6 % (ref 43.0–77.0)
Platelets: 228 10*3/uL (ref 150.0–400.0)
RBC: 4.64 Mil/uL (ref 3.87–5.11)
RDW: 15.2 % (ref 11.5–15.5)
WBC: 4.6 10*3/uL (ref 4.0–10.5)

## 2024-09-29 LAB — TSH: TSH: 2.3 u[IU]/mL (ref 0.35–5.50)

## 2024-09-29 LAB — VITAMIN D 25 HYDROXY (VIT D DEFICIENCY, FRACTURES): VITD: 25.06 ng/mL — ABNORMAL LOW (ref 30.00–100.00)

## 2024-09-29 MED ORDER — ROSUVASTATIN CALCIUM 5 MG PO TABS: ORAL_TABLET | ORAL | 2 refills | Status: AC

## 2024-09-29 MED ORDER — SERTRALINE HCL 100 MG PO TABS: 100.0000 mg | ORAL_TABLET | Freq: Every day | ORAL | 0 refills | Status: AC

## 2024-09-29 MED ORDER — ESTROGENS CONJUGATED 0.625 MG/GM VA CREA: TOPICAL_CREAM | VAGINAL | 5 refills | Status: AC

## 2024-09-29 NOTE — Patient Instructions (Addendum)
 Good to see you today.  Adaptic exercise  optimization.  Can try crestor  5 mg 2 x per week and if gets muscle pain again then stop and let us  know .  Update lab today  to include vit d level ( sometimes get muscle aches if low on a statin)

## 2024-09-29 NOTE — Progress Notes (Signed)
 "  Chief Complaint  Patient presents with   Annual Exam    Pt would like to discuss with provider on rosuvastin.     HPI: Patient  Pamela [name]  74 y.o. comes in today for Preventive Health Care visit   Stopped the crestor   because   pain above knee muscle pain  above knee for a month was taking 3 x per week.    Seems to get better ( still has oa knee issues otherwise)   Mood  related to winter and anxiety of what is around   taking sertraline  100 per day     Health Maintenance  Topic Date Due   COVID-19 Vaccine (6 - 2025-26 season) 10/15/2024 (Originally 04/25/2024)   Medicare Annual Wellness (AWV)  05/31/2025   DTaP/Tdap/Td (3 - Tdap) 01/13/2027   Colonoscopy  01/19/2034   Pneumococcal Vaccine: 50+ Years  Completed   Influenza Vaccine  Completed   Bone Density Scan  Completed   Hepatitis C Screening  Completed   Zoster Vaccines- Shingrix  Completed   Meningococcal B Vaccine  Aged Out   Mammogram  Discontinued   Health Maintenance Review LIFESTYLE:  Exercise:  yoga  Tobacco/ETS: n Alcohol:  5 per week  Sugar beverages: Sleep: 8  hours  Drug use: no HH of 1  dog   Gaine wieght over  winter eating different  and knees and hips shoulder  Has seconds ocass of light headedness upright without other sx  pal cp sob etc  not exercise related    ROS:  GEN/ HEENT: No fever, significant weight changes sweats headaches vision problems hearing changes, CV/ PULM; No chest pain shortness of breath cough, syncope,edema  change in exercise tolerance. GI /GU: No adominal pain, vomiting, change in bowel habits. No blood in the stool. No significant GU symptoms. SKIN/HEME: ,no acute skin rashes suspicious lesions or bleeding. No lymphadenopathy, nodules, masses.  NEURO/ PSYCH:  No neurologic signs such as weakness numbness. No depression anxiety. IMM/ Allergy: No unusual infections.  Allergy .   REST of 12 system review negative except as per HPI   Past Medical History:   Diagnosis Date   Arthritis KNEES, HANDS, SHOULDERS   Depression    Hx of Clostridium difficile infection    Hyperlipidemia, mixed    Impingement syndrome of right shoulder    Intestinal bacterial overgrowth 06/20/2013   Under rx per dr Kristie    Skin cancer    [name]    Vaginitis, atrophic    recurrent    Past Surgical History:  Procedure Laterality Date   ABDOMINAL HYSTERECTOMY  1992   W/ BILATERAL SALPINGO-OOPHORECTOMY   CARPAL TUNNEL RELEASE     SHOULDER ARTHROSCOPY WITH ROTATOR CUFF REPAIR  08/06/2012   Procedure: SHOULDER ARTHROSCOPY WITH ROTATOR CUFF REPAIR;  Surgeon: Lynwood SHAUNNA Bern, MD;  Location: [facility];  Service: Orthopedics;  Laterality: Right;   SHOULDER ARTHROSCOPY WITH SUBACROMIAL DECOMPRESSION  08/06/2012   Procedure: SHOULDER ARTHROSCOPY WITH SUBACROMIAL DECOMPRESSION;  Surgeon: Lynwood SHAUNNA Bern, MD;  Location: [facility];  Service: Orthopedics;  Laterality: Right;  debridement of labral   TONSILLECTOMY  AS CHILD    Family History  Problem Relation Age of Onset   Arthritis Sister         CPPD [employer]   Hyperlipidemia Other        family hx   Hypertension Other        family hx   Sudden death Other  family hx   Heart disease Other        family hx   Multiple myeloma Father        deceased   Stroke Mother 1    Social History   Socioeconomic History   Marital status: Widowed    Spouse name: Not on file   Number of children: Not on file   Years of education: Not on file   Highest education level: Master's degree (e.g., MA, MS, MEng, MEd, MSW, MBA)  Occupational History   Occupation: retired    Associate Professor: RETIRED  Tobacco Use   Smoking status: Never   Smokeless tobacco: Never  Substance and Sexual Activity   Alcohol use: Yes    Comment: OCCASIONAL   Drug use: No   Sexual activity: Not on file  Other Topics Concern   Not on file  Social History Narrative   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  Has smoke detector and wears seat belts.  No firearms. No excess sun exposure. Sees dentist regularly .   Dexa nl October 14, 2003 and Oct 13, 2006   Husband inhpsicde care for  Throat  Cancer  13-Oct-2017 passed away August 12, 2018  Social Drivers of Health   Tobacco Use: Low Risk (09/29/2024)   Patient History    Smoking Tobacco Use: Never    Smokeless Tobacco Use: Never    Passive Exposure: Not on file  Financial Resource Strain: Low Risk (09/27/2024)   Overall Financial Resource Strain (CARDIA)    Difficulty of Paying Living Expenses: Not hard at all  Food Insecurity: No Food Insecurity (09/27/2024)   Epic    Worried About Radiation Protection Practitioner of Food in the Last Year: Never true    Ran Out of Food in the Last Year: Never true  Transportation Needs: No Transportation Needs (09/27/2024)   Epic    Lack of Transportation (Medical): No    Lack of Transportation (Non-Medical): No  Physical Activity: Insufficiently Active (09/27/2024)   Exercise Vital Sign    Days of Exercise per Week: 4 days    Minutes of Exercise per Session: 20 min  Stress: Stress Concern Present (09/27/2024)   [employer] of Occupational Health - Occupational Stress Questionnaire    Feeling of Stress: Very much  Social Connections: Moderately Integrated (09/27/2024)   Social Connection and Isolation Panel    Frequency of Communication with Friends and Family: More than three times a week    Frequency of Social Gatherings with Friends and Family: Three times a week    Attends Religious Services: More than 4 times per year    Active Member of Clubs or Organizations: Yes    Attends Banker Meetings: More than 4 times per year    Marital Status: Widowed  Depression (PHQ2-9): Low Risk (05/31/2024)   Depression (PHQ2-9)    PHQ-2 Score: 3  Alcohol Screen: Low Risk (09/27/2024)   Alcohol Screen    Last Alcohol Screening Score (AUDIT): 4  Housing: Low  Risk (09/27/2024)   Epic    Unable to Pay for Housing in the Last Year: No    Number of Times Moved in the Last Year: 0    Homeless in the Last Year: No  Utilities: Not At Risk (05/05/2023)   [facility] Utilities    Threatened with loss of utilities: No  Health Literacy: Not on file    Outpatient Medications Prior to Visit  Medication Sig Dispense Refill   VITAMIN D PO Take by mouth.     conjugated estrogens  (PREMARIN ) vaginal cream Use vaginally as instructed 4 times a week. 42.5 g 5   rosuvastatin  (CRESTOR ) 5 MG tablet TAKE 1 TABLET BY MOUTH 3 TIMES PER WEEK AND THEN AS DIRECTED (Patient not taking: Reported on 09/29/2024) 36 tablet 2   sertraline  (ZOLOFT ) 100 MG tablet TAKE 1 TABLET BY MOUTH DAILY 90 tablet 0   No facility-administered medications prior to visit.     EXAM:  BP 110/64 (BP Location: Left Arm, Patient Position: Sitting, Cuff Size: Normal)   Pulse 70   Temp 98 F (36.7 C) (Oral)   Ht 5' 4 (1.626 m)   Wt 171 lb 9.6 oz (77.8 kg)   SpO2 97%   BMI 29.46 kg/m   Body mass index is 29.46 kg/m. Wt Readings from Last 3 Encounters:  09/29/24 171 lb 9.6 oz (77.8 kg)  09/24/23 162 lb 9.6 oz (73.8 kg)  05/05/23 157 lb (71.2 kg)    Physical Exam: Vital signs reviewed HZW:Uypd is a well-developed well-nourished alert cooperative    who appearsr stated age in no acute distress.  HEENT: normocephalic atraumatic , Eyes: PERRL EOM's full, conjunctiva clear, Nares: paten,t no deformity discharge or tenderness., Ears: no deformity EAC's clear TMs with normal landmarks. Mouth: clear OP, no lesions, edema.  Moist mucous membranes. Dentition in adequate repair. NECK: supple without masses, thyromegaly or bruits. CHEST/PULM:  Clear to auscultation and percussion breath sounds equal no wheeze , rales or rhonchi. No chest wall deformities or tenderness. Breast: normal by inspection . No dimpling, discharge, masses, tenderness or discharge . Left lateral thoracic tender area( she say area of  old rib fx)  CV: PMI is nondisplaced, S1 S2 no gallops, murmurs, rubs. Peripheral pulses are full without delay.No JVD .  ABDOMEN: Bowel sounds normal nontender  No guard or rebound, no hepato splenomegal no CVA tenderness. Extremtities:  No clubbing cyanosis or edema, no acute joint swelling or redness no focal atrophy NEURO:  Oriented x3, cranial nerves 3-12 appear to be intact, no obvious focal weakness,gait within normal limits no abnormal reflexes or asymmetrical SKIN: No acute rashes normal turgor, color, no bruising or petechiae. PSYCH: Oriented, good eye contact, no obvious depression anxiety, cognition and judgment appear normal. LN: no cervical axillaryadenopathy  Lab Results  Component Value Date   WBC 5.2 09/24/2023   HGB 13.1 09/24/2023   HCT 39.7 09/24/2023   PLT 249.0 09/24/2023   GLUCOSE 89 09/24/2023   CHOL 189 09/24/2023   TRIG 114.0 09/24/2023   HDL 74.90 09/24/2023   LDLDIRECT 173.5 06/13/2013   LDLCALC 92 09/24/2023   ALT 22 09/24/2023   AST 21 09/24/2023   NA 140 09/24/2023   K 4.2 09/24/2023   CL 101 09/24/2023   CREATININE 0.79 09/24/2023   BUN 16 09/24/2023   CO2 27 09/24/2023   TSH 2.43 09/24/2023    BP Readings from Last 3 Encounters:  09/29/24 110/64  09/24/23 116/60  05/05/23 136/67    Lab results reviewed with patient   ASSESSMENT AND PLAN:  Discussed the following assessment and plan:    ICD-10-CM   1. Routine general medical examination at a health care facility  Z00.00     2. Myalgia  M79.10 CBC with Differential/Platelet    Comprehensive metabolic panel with GFR    Lipid panel  TSH    Vitamin D, 25-hydroxy    3. Mixed hyperlipidemia  E78.2 CBC with Differential/Platelet    Comprehensive metabolic panel with GFR    Lipid panel    TSH    Vitamin D, 25-hydroxy    rosuvastatin  (CRESTOR ) 5 MG tablet    4. Medication management  Z79.899 CBC with Differential/Platelet    Comprehensive metabolic panel with GFR    Lipid panel     TSH    Vitamin D, 25-hydroxy    Poss se of statain at low dose  can retry at 2 x per week and update us   If  returning then stop and will place on SE list.  Consider other options.  Optimizing  activity  cont on sertraline   stay hydrated  Fu If progression of any concerns  Otherwise yearly visit  Disc rsv vaccine  not high risk  Anxiety  discussed cont sertraline   Return for depending on results and how doing .  Patient Care Team: Tateanna Bach, Apolinar POUR, MD as PCP - General Ernie Cough, MD (Orthopedic Surgery) Kristie Lamprey, MD as Attending Physician (Gastroenterology) Ernie Cough, MD as Consulting Physician (Orthopedic Surgery) Patient Instructions  Good to see you today.  Adaptic exercise  optimization.  Can try crestor  5 mg 2 x per week and if gets muscle pain again then stop and let us  know .  Update lab today  to include vit d level ( sometimes get muscle aches if low on a statin)  Bill Yohn K. Ac Colan M.D.  "

## 2024-09-30 ENCOUNTER — Ambulatory Visit: Payer: Self-pay | Admitting: Internal Medicine

## 2024-09-30 DIAGNOSIS — E782 Mixed hyperlipidemia: Secondary | ICD-10-CM

## 2024-09-30 DIAGNOSIS — E559 Vitamin D deficiency, unspecified: Secondary | ICD-10-CM

## 2024-09-30 NOTE — Progress Notes (Signed)
 Vit d modestly low    Then can try again   for 2 x per week rosuvastatin    We may also add a med like zetia  to get the number down   The total cholesterol is quite high again off med  Also   take vit d 2000 I u per day If tolerated  lets repeat  lipid panel and vit d in 3 months or so

## 2024-10-04 ENCOUNTER — Ambulatory Visit: Admitting: Internal Medicine

## 2025-10-05 ENCOUNTER — Encounter: Admitting: Internal Medicine
